# Patient Record
Sex: Female | Born: 1940
Health system: Southern US, Community
[De-identification: ages and names within clinical notes are randomized; demographics above are authoritative.]

## PROBLEM LIST (undated history)

## (undated) DIAGNOSIS — J45990 Exercise induced bronchospasm: Secondary | ICD-10-CM

## (undated) DIAGNOSIS — I442 Atrioventricular block, complete: Secondary | ICD-10-CM

## (undated) DIAGNOSIS — R112 Nausea with vomiting, unspecified: Secondary | ICD-10-CM

## (undated) DIAGNOSIS — Z860100 Personal history of colon polyps, unspecified: Secondary | ICD-10-CM

## (undated) DIAGNOSIS — Z950 Presence of cardiac pacemaker: Secondary | ICD-10-CM

## (undated) DIAGNOSIS — F329 Major depressive disorder, single episode, unspecified: Secondary | ICD-10-CM

## (undated) DIAGNOSIS — J45909 Unspecified asthma, uncomplicated: Secondary | ICD-10-CM

## (undated) DIAGNOSIS — I469 Cardiac arrest, cause unspecified: Secondary | ICD-10-CM

## (undated) DIAGNOSIS — F32A Depression, unspecified: Secondary | ICD-10-CM

## (undated) DIAGNOSIS — R87619 Unspecified abnormal cytological findings in specimens from cervix uteri: Secondary | ICD-10-CM

## (undated) DIAGNOSIS — J9601 Acute respiratory failure with hypoxia: Secondary | ICD-10-CM

## (undated) DIAGNOSIS — I4891 Unspecified atrial fibrillation: Secondary | ICD-10-CM

## (undated) DIAGNOSIS — I10 Essential (primary) hypertension: Secondary | ICD-10-CM

## (undated) DIAGNOSIS — M8580 Other specified disorders of bone density and structure, unspecified site: Secondary | ICD-10-CM

## (undated) DIAGNOSIS — Z9889 Other specified postprocedural states: Secondary | ICD-10-CM

## (undated) HISTORY — DX: Other specified disorders of bone density and structure, unspecified site: M85.80

## (undated) HISTORY — PX: FRACTURE SURGERY: SHX138

## (undated) HISTORY — PX: TUBAL LIGATION: SHX77

## (undated) HISTORY — PX: TONSILLECTOMY: SUR1361

## (undated) HISTORY — PX: CHOLECYSTECTOMY OPEN: SUR202

## (undated) HISTORY — DX: Personal history of colonic polyps: Z86.010

## (undated) HISTORY — DX: Unspecified atrial fibrillation: I48.91

## (undated) HISTORY — PX: APPENDECTOMY: SHX54

## (undated) HISTORY — DX: Unspecified abnormal cytological findings in specimens from cervix uteri: R87.619

## (undated) HISTORY — PX: WRIST FRACTURE SURGERY: SHX121

## (undated) HISTORY — DX: Acute respiratory failure with hypoxia: J96.01

## (undated) HISTORY — DX: Personal history of colon polyps, unspecified: Z86.0100

## (undated) HISTORY — DX: Unspecified asthma, uncomplicated: J45.909

## (undated) HISTORY — PX: CRYOTHERAPY: SHX1416

## (undated) HISTORY — DX: Cardiac arrest, cause unspecified: I46.9

---

## 1998-07-04 ENCOUNTER — Other Ambulatory Visit: Admission: RE | Admit: 1998-07-04 | Discharge: 1998-07-04 | Payer: Self-pay | Admitting: Obstetrics and Gynecology

## 1998-10-11 ENCOUNTER — Encounter (INDEPENDENT_AMBULATORY_CARE_PROVIDER_SITE_OTHER): Payer: Self-pay

## 1998-10-11 ENCOUNTER — Other Ambulatory Visit: Admission: RE | Admit: 1998-10-11 | Discharge: 1998-10-11 | Payer: Self-pay | Admitting: Gastroenterology

## 1998-12-27 ENCOUNTER — Encounter: Admission: RE | Admit: 1998-12-27 | Discharge: 1998-12-27 | Payer: Self-pay | Admitting: Obstetrics and Gynecology

## 1998-12-27 ENCOUNTER — Encounter: Payer: Self-pay | Admitting: Obstetrics and Gynecology

## 1999-07-03 ENCOUNTER — Other Ambulatory Visit: Admission: RE | Admit: 1999-07-03 | Discharge: 1999-07-03 | Payer: Self-pay | Admitting: Obstetrics and Gynecology

## 2000-07-08 ENCOUNTER — Other Ambulatory Visit: Admission: RE | Admit: 2000-07-08 | Discharge: 2000-07-08 | Payer: Self-pay | Admitting: Obstetrics and Gynecology

## 2001-08-22 ENCOUNTER — Other Ambulatory Visit: Admission: RE | Admit: 2001-08-22 | Discharge: 2001-08-22 | Payer: Self-pay | Admitting: Obstetrics and Gynecology

## 2002-02-03 ENCOUNTER — Encounter: Payer: Self-pay | Admitting: Obstetrics and Gynecology

## 2002-02-03 ENCOUNTER — Encounter: Admission: RE | Admit: 2002-02-03 | Discharge: 2002-02-03 | Payer: Self-pay | Admitting: Obstetrics and Gynecology

## 2002-08-24 ENCOUNTER — Other Ambulatory Visit: Admission: RE | Admit: 2002-08-24 | Discharge: 2002-08-24 | Payer: Self-pay | Admitting: Obstetrics and Gynecology

## 2003-08-13 ENCOUNTER — Other Ambulatory Visit: Admission: RE | Admit: 2003-08-13 | Discharge: 2003-08-13 | Payer: Self-pay | Admitting: Obstetrics and Gynecology

## 2003-12-29 ENCOUNTER — Ambulatory Visit: Payer: Self-pay | Admitting: Gastroenterology

## 2004-01-11 ENCOUNTER — Ambulatory Visit: Payer: Self-pay | Admitting: Gastroenterology

## 2004-02-03 ENCOUNTER — Ambulatory Visit: Payer: Self-pay | Admitting: Internal Medicine

## 2004-08-16 ENCOUNTER — Other Ambulatory Visit: Admission: RE | Admit: 2004-08-16 | Discharge: 2004-08-16 | Payer: Self-pay | Admitting: *Deleted

## 2005-08-17 ENCOUNTER — Other Ambulatory Visit: Admission: RE | Admit: 2005-08-17 | Discharge: 2005-08-17 | Payer: Self-pay | Admitting: Obstetrics & Gynecology

## 2006-08-21 ENCOUNTER — Other Ambulatory Visit: Admission: RE | Admit: 2006-08-21 | Discharge: 2006-08-21 | Payer: Self-pay | Admitting: Obstetrics & Gynecology

## 2007-08-25 ENCOUNTER — Other Ambulatory Visit: Admission: RE | Admit: 2007-08-25 | Discharge: 2007-08-25 | Payer: Self-pay | Admitting: Obstetrics & Gynecology

## 2008-12-03 ENCOUNTER — Encounter (INDEPENDENT_AMBULATORY_CARE_PROVIDER_SITE_OTHER): Payer: Self-pay | Admitting: *Deleted

## 2008-12-13 ENCOUNTER — Ambulatory Visit (HOSPITAL_COMMUNITY): Admission: RE | Admit: 2008-12-13 | Discharge: 2008-12-13 | Payer: Self-pay | Admitting: Internal Medicine

## 2009-05-20 DIAGNOSIS — J302 Other seasonal allergic rhinitis: Secondary | ICD-10-CM | POA: Insufficient documentation

## 2009-05-20 DIAGNOSIS — M1990 Unspecified osteoarthritis, unspecified site: Secondary | ICD-10-CM | POA: Insufficient documentation

## 2009-09-07 ENCOUNTER — Telehealth (INDEPENDENT_AMBULATORY_CARE_PROVIDER_SITE_OTHER): Payer: Self-pay | Admitting: *Deleted

## 2010-02-11 ENCOUNTER — Emergency Department (HOSPITAL_BASED_OUTPATIENT_CLINIC_OR_DEPARTMENT_OTHER)
Admission: EM | Admit: 2010-02-11 | Discharge: 2010-02-11 | Payer: Self-pay | Source: Home / Self Care | Admitting: Emergency Medicine

## 2010-02-24 ENCOUNTER — Encounter
Admission: RE | Admit: 2010-02-24 | Discharge: 2010-02-24 | Payer: Self-pay | Source: Home / Self Care | Attending: Orthopedic Surgery | Admitting: Orthopedic Surgery

## 2010-03-28 NOTE — Progress Notes (Signed)
Summary: Colonoscopy done --- Changed Care.   Phone Note Outgoing Call Call back at [facility] Phone [phone]   Call placed by: Harlow Mares CMA Duncan Dull),  September 07, 2009 10:55 AM Call placed to: Patient Summary of Call: pt states that since Dr. Corinda Gubler retired she decide to change care. She has had her colonoscopy done already  Initial call taken by: Harlow Mares CMA Duncan Dull),  September 07, 2009 10:57 AM

## 2010-03-28 NOTE — Letter (Signed)
Summary: Colonoscopy Letter  [facility]  [address] [place], [place] [address]   Phone: [phone]  Fax: [phone]      December 03, 2008 MRN: [id number]   Danielle Lucas [address] [place], [place]  [address]   Dear [name],   According to your medical record, it is time for you to schedule a Colonoscopy. The American Cancer Society recommends this procedure as a method to detect early colon cancer. Patients with a family history of colon cancer, or a personal history of colon polyps or inflammatory bowel disease are at increased risk.  This letter has beeen generated based on the recommendations made at the time of your procedure. If you feel that in your particular situation this may no longer apply, please contact our office.  Please call our office at [phone] to schedule this appointment or to update your records at your earliest convenience.  Thank you for cooperating with [place] to provide you with the very best care possible.   Sincerely,  Rachael Fee, M.D.  [facility] [phone]

## 2010-10-23 ENCOUNTER — Other Ambulatory Visit: Payer: Self-pay | Admitting: Internal Medicine

## 2010-10-23 DIAGNOSIS — R102 Pelvic and perineal pain: Secondary | ICD-10-CM

## 2010-10-25 ENCOUNTER — Other Ambulatory Visit: Payer: Self-pay

## 2010-10-31 ENCOUNTER — Ambulatory Visit
Admission: RE | Admit: 2010-10-31 | Discharge: 2010-10-31 | Disposition: A | Discharge status: Home / Self Care | Payer: Medicare Other | Source: Ambulatory Visit | Attending: Internal Medicine | Admitting: Internal Medicine

## 2010-10-31 DIAGNOSIS — R102 Pelvic and perineal pain: Secondary | ICD-10-CM

## 2012-01-01 ENCOUNTER — Other Ambulatory Visit: Payer: Self-pay | Admitting: Internal Medicine

## 2012-01-01 DIAGNOSIS — F329 Major depressive disorder, single episode, unspecified: Secondary | ICD-10-CM | POA: Insufficient documentation

## 2012-01-01 DIAGNOSIS — R1030 Lower abdominal pain, unspecified: Secondary | ICD-10-CM

## 2012-01-03 ENCOUNTER — Ambulatory Visit
Admission: RE | Admit: 2012-01-03 | Discharge: 2012-01-03 | Disposition: A | Discharge status: Home / Self Care | Payer: Medicare Other | Source: Ambulatory Visit | Attending: Internal Medicine | Admitting: Internal Medicine

## 2012-01-03 DIAGNOSIS — R1030 Lower abdominal pain, unspecified: Secondary | ICD-10-CM

## 2012-10-14 ENCOUNTER — Encounter (HOSPITAL_COMMUNITY): Payer: Self-pay

## 2012-10-14 ENCOUNTER — Ambulatory Visit (HOSPITAL_COMMUNITY)
Admission: RE | Admit: 2012-10-14 | Discharge: 2012-10-14 | Disposition: A | Discharge status: Home / Self Care | Payer: Medicare Other | Source: Ambulatory Visit | Attending: Internal Medicine | Admitting: Internal Medicine

## 2012-10-14 DIAGNOSIS — M810 Age-related osteoporosis without current pathological fracture: Secondary | ICD-10-CM | POA: Insufficient documentation

## 2012-10-14 HISTORY — DX: Other specified postprocedural states: Z98.890

## 2012-10-14 HISTORY — DX: Depression, unspecified: F32.A

## 2012-10-14 HISTORY — DX: Major depressive disorder, single episode, unspecified: F32.9

## 2012-10-14 HISTORY — DX: Nausea with vomiting, unspecified: R11.2

## 2012-10-14 MED ORDER — ZOLEDRONIC ACID 5 MG/100ML IV SOLN
5.0000 mg | Freq: Once | INTRAVENOUS | Status: AC
  Administered 2012-10-14: 5 mg via INTRAVENOUS
  Filled 2012-10-14: qty 100

## 2012-10-14 MED ORDER — SODIUM CHLORIDE 0.9 % IV SOLN
INTRAVENOUS | Status: DC
  Administered 2012-10-14: 20 mL/h via INTRAVENOUS

## 2012-11-03 ENCOUNTER — Encounter (HOSPITAL_COMMUNITY): Payer: Medicare Other

## 2013-01-14 ENCOUNTER — Encounter: Payer: Self-pay | Admitting: Obstetrics & Gynecology

## 2013-01-15 ENCOUNTER — Encounter: Payer: Self-pay | Admitting: Obstetrics & Gynecology

## 2013-01-15 ENCOUNTER — Ambulatory Visit (INDEPENDENT_AMBULATORY_CARE_PROVIDER_SITE_OTHER): Payer: Medicare Other | Admitting: Obstetrics & Gynecology

## 2013-01-15 VITALS — BP 122/80 | HR 72 | Resp 18 | Ht 64.25 in | Wt 142.0 lb

## 2013-01-15 DIAGNOSIS — N8111 Cystocele, midline: Secondary | ICD-10-CM

## 2013-01-15 DIAGNOSIS — Z01419 Encounter for gynecological examination (general) (routine) without abnormal findings: Secondary | ICD-10-CM

## 2013-01-15 DIAGNOSIS — IMO0002 Reserved for concepts with insufficient information to code with codable children: Secondary | ICD-10-CM

## 2013-01-15 DIAGNOSIS — N814 Uterovaginal prolapse, unspecified: Secondary | ICD-10-CM

## 2013-01-15 DIAGNOSIS — Z124 Encounter for screening for malignant neoplasm of cervix: Secondary | ICD-10-CM

## 2013-01-15 NOTE — Progress Notes (Signed)
72 y.o. [id number] DivorcedCaucasianF here for annual exam.  No vaginal bleeding.  Had BMD with Dr. Laurey Morale office.  Did Reclast in the summer.  Repeat BMD in two years.  Patient is getting ready to think about having bladder repair.  Planning on going to [place] next year.  Went to [place] in September.    Cystocele is really bothering pt now.  She is ready to proceed with surgery.  D/W pt need for hysterectomy at same time due to pt's uterine prolapse.  D/W pt surgery, recommendation for ovary removal as well, hospital stay, recovery.  She feels she needs to "fit this in" this year if possible.  Works part time with [employer] and not sure she can do anything until April.  D/W pt pessary use until then.  She wants to know why all of a sudden she is feeling so much worse, so quickly.    Patient's last menstrual period was 02/27/2000.          Sexually active: no  The current method of family planning is post menopausal status.    Exercising: yes  cardio,weights, yoga 4x/wk Smoker:  no  Health Maintenance: Pap:  11/03/2010 Neg History of abnormal Pap:  Yes, cryosurgery  MMG:  10/16/12 Bi-Rads 2  Colonoscopy:  2011 f/u in 5 years ago, Dr. Elnoria Howard BMD:   7/14 TDaP:  2012 (at [facility]) Pneumovax: 2006, Zostavax: 2006 Screening Labs: PCP, Hb today: PCP, Urine today: PCP   reports that she has quit smoking. She does not have any smokeless tobacco history on file. She reports that she does not use illicit drugs.  Past Medical History  Diagnosis Date  . Depression   . PONV (postoperative nausea and vomiting)     pt. thinks it was from dilaudid  . Hx of colonic polyps   . Asthma     as a child  . Osteopenia     Past Surgical History  Procedure Laterality Date  . Appendectomy    . Tubal ligation    . Cholecystectomy    . Left wrist Left     left wrist, plate inserted    Current Outpatient Prescriptions  Medication Sig Dispense Refill  . albuterol (PROVENTIL HFA;VENTOLIN HFA) 108 (90 BASE) MCG/ACT  inhaler Inhale 2 puffs into the lungs every 6 (six) hours as needed for wheezing.      Marland Kitchen aspirin EC 81 MG tablet Take 81 mg by mouth daily.      . Calcium-Vitamin D-Vitamin K (VIACTIV) 500-500-40 MG-UNT-MCG CHEW Chew by mouth 2 (two) times daily.      . Cholecalciferol (VITAMIN D) 2000 UNITS CAPS Take by mouth.      . escitalopram (LEXAPRO) 10 MG tablet Take 5 mg by mouth daily.       . TURMERIC PO Take by mouth.      . Zoledronic Acid (RECLAST IV) Inject into the vein.      . Cyanocobalamin (VITAMIN B-12 PO) Take by mouth daily.      . Multiple Vitamins-Minerals (MULTIVITAMIN WITH MINERALS) tablet Take 1 tablet by mouth daily.       No current facility-administered medications for this visit.    Family History  Problem Relation Age of Onset  . Varicose Veins Mother   . Heart attack Father   . Cancer Brother     colon cancer (polyps)    ROS:  Pertinent items are noted in HPI.  Otherwise, a comprehensive ROS was negative.  Exam:   BP  122/80  Pulse 72  Resp 18  Ht 5' 4.25" (1.632 m)  Wt 142 lb (64.411 kg)  BMI 24.18 kg/m2  LMP 02/27/2000  Weight change: +2lb   Height: 5' 4.25" (163.2 cm)  Ht Readings from Last 3 Encounters:  01/15/13 5' 4.25" (1.632 m)  10/14/12 5\' 3"  (1.6 m)    General appearance: alert, cooperative and appears stated age Head: Normocephalic, without obvious abnormality, atraumatic Neck: no adenopathy, supple, symmetrical, trachea midline and thyroid normal to inspection and palpation Lungs: clear to auscultation bilaterally Breasts: normal appearance, no masses or tenderness Heart: regular rate and rhythm Abdomen: soft, non-tender; bowel sounds normal; no masses,  no organomegaly Extremities: extremities normal, atraumatic, no cyanosis or edema Skin: Skin color, texture, turgor normal. No rashes or lesions Lymph nodes: Cervical, supraclavicular, and axillary nodes normal. No abnormal inguinal nodes palpated Neurologic: Grossly normal   Pelvic:  External genitalia:  no lesions              Urethra:  normal appearing urethra with no masses, tenderness or lesions              Bartholins and Skenes: normal                 Vagina: normal appearing vagina with normal color and discharge, no lesions, 4th degree cystocele              Cervix: no lesions              Pap taken: yes Bimanual Exam:  Uterus:  Small and mobile with 3rd degree uterine prolapse, significant change from prior exams              Adnexa: normal adnexa and no mass, fullness, tenderness               Rectovaginal: Confirms               Anus:  normal sphincter tone, no lesions  A:  Well Woman with normal exam PMP, no HRT Large cystocele with uterine prolapse--worse since last exam Osteopenia--followed by Dr. Waynard Edwards Dense breasts H/O colonic polyps  P:   Mammogram yearly.  D/W 3D MMG pap smear today Lengthy discussion with pt regarding change in prolapse and surgery as documented in note.  Pt unsure about timing.  Advised she needs to check on this.  She will most likely need to return to see Dr. Edward Jolly and schedule urodynamics as well.  return annually or prn  ~In addition to annual exam, 20 minutes spent with patient discussing uterine prolapse and cystocele issues as documented above.    An After Visit Summary was printed and given to the patient.

## 2013-01-16 DIAGNOSIS — N814 Uterovaginal prolapse, unspecified: Secondary | ICD-10-CM | POA: Insufficient documentation

## 2013-01-16 DIAGNOSIS — IMO0002 Reserved for concepts with insufficient information to code with codable children: Secondary | ICD-10-CM | POA: Insufficient documentation

## 2013-01-16 NOTE — Patient Instructions (Signed)

## 2013-01-19 ENCOUNTER — Telehealth: Payer: Self-pay | Admitting: *Deleted

## 2013-01-19 LAB — IPS PAP SMEAR ONLY

## 2013-01-19 NOTE — Telephone Encounter (Signed)
Follow-up call to patient regarding surgery.  She is still leaning toward mid April 2015.  Discussed consult with Dr Edward Jolly as well and scheduled for 03-19-13.  Following that appointment, patient will let me know about surgery date preferences.  Has pessary appointment with Dr Hyacinth Meeker 02-02-13.  Routing to provider for final review. Patient agreeable to disposition. Will close encounter

## 2013-01-26 DIAGNOSIS — E785 Hyperlipidemia, unspecified: Secondary | ICD-10-CM | POA: Insufficient documentation

## 2013-02-02 ENCOUNTER — Encounter: Payer: Self-pay | Admitting: Obstetrics & Gynecology

## 2013-02-02 ENCOUNTER — Ambulatory Visit (INDEPENDENT_AMBULATORY_CARE_PROVIDER_SITE_OTHER): Payer: Medicare Other | Admitting: Obstetrics & Gynecology

## 2013-02-02 VITALS — BP 112/68 | HR 72 | Resp 16 | Wt 141.0 lb

## 2013-02-02 DIAGNOSIS — N8111 Cystocele, midline: Secondary | ICD-10-CM

## 2013-02-02 DIAGNOSIS — N814 Uterovaginal prolapse, unspecified: Secondary | ICD-10-CM

## 2013-02-02 DIAGNOSIS — IMO0002 Reserved for concepts with insufficient information to code with codable children: Secondary | ICD-10-CM

## 2013-02-02 NOTE — Progress Notes (Signed)
72 y.o. DivorcedWhite female [id number] here for pessary fitting.  Patient has been diagnosed with the following indications warranting pessary use: Cystocele- symptomatic and incomplete uterine prolapse.    She reports these associated symptoms: Vaginal discharge: no Vaginal pressure:  yes Urinary symptoms:   urinary incontinence.   Other:  none  She has been counseled about other options including pelvic physical therapy, surgical intervention, as well as doing nothing.  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      Patient is not sexually active. Urine culture has not been performed.   Exam: BP 112/68  Pulse 72  Resp 16  Wt 141 lb (63.957 kg)  LMP 02/27/2000 General appearance: alert and cooperative Inguinal adenopathy: neg   Pelvic: External genitalia:  no lesions              Urethra: normal appearing urethra with no masses, tenderness or lesions              Bartholins and Skenes: Bartholin's, Urethra, Skene's normal                 Vagina: normal appearing vagina with normal color and discharge, no lesions, 4th degree prolapse with 2nd degree uterine prolapse              Cervix: normal appearance Bimanual Exam:  Uterus:  uterus is normal size, shape, consistency and nontender                               Adnexa:    normal adnexa in size, nontender and no masses                               Anus:  defer exam  Procedure:  Patient fitted with the following pessary and sizes:  Incontinence ring with support, #4, #5, and #6.  After each fitting, patient was advised to redress, ambulate, and attempt to void.  The best fit was with #5.  Pessary was removed without difficulty.   Assessment:   Cystocele- symptomatic Incomplete uterine prolapse  Plan: Pessary will be ordered for patient and she will return to office in 1 weeks for pessary pick-up and/or placement.

## 2013-02-16 ENCOUNTER — Ambulatory Visit (INDEPENDENT_AMBULATORY_CARE_PROVIDER_SITE_OTHER): Payer: Medicare Other | Admitting: Obstetrics & Gynecology

## 2013-02-16 VITALS — BP 120/76 | HR 60 | Resp 16 | Ht 64.25 in | Wt 140.4 lb

## 2013-02-16 DIAGNOSIS — IMO0002 Reserved for concepts with insufficient information to code with codable children: Secondary | ICD-10-CM

## 2013-02-16 DIAGNOSIS — N814 Uterovaginal prolapse, unspecified: Secondary | ICD-10-CM

## 2013-02-16 DIAGNOSIS — N8111 Cystocele, midline: Secondary | ICD-10-CM

## 2013-02-16 NOTE — Progress Notes (Signed)
72 y.o. Divorced White female  [id number] here for pessary placement.  Patient has been diagnosed with the following indications warranting pessary use: Cystocele- symptomatic.  Patient is trying to get through the next few months and tax season until she can really consider surgery.  Pt will be using a #5 incontinence ring with support and without knob.  Exam: BP 120/76  Pulse 60  Resp 16  Ht 5' 4.25" (1.632 m)  Wt 140 lb 6.4 oz (63.685 kg)  BMI 23.91 kg/m2  LMP 02/27/2000 General appearance: alert and cooperative   Pelvic: External genitalia:  no lesions              Urethra: normal appearing urethra with no masses, tenderness or lesions              Bartholins and Skenes: Bartholin's, Urethra, Skene's normal                 Vagina: normal appearing vagina with normal color and discharge, no lesions, atrophic, large 4th degree cystocele present              Cervix: normal appearance Bimanual Exam:  Uterus:  uterus is normal size, shape, consistency and non tender, 3rd degree uterine prolapse present                               Adnexa:    normal adnexa in size, nontender and no masses                               Anus:  defer exam  Procedure:  Patient fitted with #5 incontinence ring without difficulty.  Pt able to walk and void without problems.   Assessment:  Cystocele and incomplete uterine prolapse  Plan: Pt to return in 3 months for recheck if no issues before that time.

## 2013-02-24 ENCOUNTER — Encounter: Payer: Self-pay | Admitting: Obstetrics & Gynecology

## 2013-02-24 ENCOUNTER — Telehealth: Payer: Self-pay

## 2013-02-24 ENCOUNTER — Ambulatory Visit: Payer: Medicare Other | Admitting: Obstetrics & Gynecology

## 2013-02-24 NOTE — Telephone Encounter (Signed)
Spoke with patient-states she is doing ok. Is having some leaking, having to wear a pad, but aware this is temporary. Also is having an achy feeling with urination. Please advise.

## 2013-02-24 NOTE — Telephone Encounter (Signed)
OK to monitor.  Call if has worsening symptoms.

## 2013-02-24 NOTE — Telephone Encounter (Signed)
lmtcb

## 2013-02-24 NOTE — Telephone Encounter (Signed)
Message copied by Elisha Headland on Tue Feb 24, 2013 10:52 AM ------      Message from: Jerene Bears      Created: Tue Feb 24, 2013  6:59 AM      Regarding: call pt please       Tresa Endo      Can you check on mrs. Coger.  I placed a new pessary last week and want to know how she is doing.  Thanks. ------

## 2013-02-24 NOTE — Telephone Encounter (Signed)
Patient aware and will call prn//kn

## 2013-02-26 HISTORY — PX: TOTAL ABDOMINAL HYSTERECTOMY W/ BILATERAL SALPINGOOPHORECTOMY: SHX83

## 2013-03-09 ENCOUNTER — Ambulatory Visit: Payer: Medicare Other | Admitting: Obstetrics and Gynecology

## 2013-03-19 ENCOUNTER — Encounter: Payer: Self-pay | Admitting: Obstetrics and Gynecology

## 2013-03-19 ENCOUNTER — Ambulatory Visit (INDEPENDENT_AMBULATORY_CARE_PROVIDER_SITE_OTHER): Payer: Medicare HMO | Admitting: Obstetrics and Gynecology

## 2013-03-19 VITALS — BP 129/71 | HR 69 | Resp 14 | Wt 140.0 lb

## 2013-03-19 DIAGNOSIS — N813 Complete uterovaginal prolapse: Secondary | ICD-10-CM

## 2013-03-19 DIAGNOSIS — N393 Stress incontinence (female) (male): Secondary | ICD-10-CM

## 2013-03-19 NOTE — Progress Notes (Signed)
Patient ID: Danielle Lucas, female   DOB: 06-18-1940, 73 y.o.   MRN: [id number]  GYNECOLOGY PROBLEM VISIT  PCP:   Referring provider:  M. Edwinna Areola, MD  HPI: 73 y.o.   Divorced  Caucasian  female   [phone] with Patient's last menstrual period was 02/27/2000.   here for discussion of prolapse of uterus and bladder for several years.  Sees Dr. Sabra Heck for routine gynecologic exam, who has noted a worsening of prolapse.  Patient notes a change over the last 6 months.  Using a pessary currently.  Now having urinary leakage with the pessary in place.  Leaks with cough and laugh. Does not really leak for no reason at all.  Hs some urgency to void.  DF - depends on water intake, but not too often.  NF - once a night. Has had some experiences of wetting at night but is every aware and immediately wakes up.  Just a few drops only.  Did not void well before receiving the pessary.  Voids better now.   No constipation but does push around the rectum once in a while to have bowel movements.   No prior bladder or prolapse evaluation or treatment. No physical therapy to date.   Bottom sore from diarrhea following antibiotics for a sinus infections.  Healthy and does not have any respiratory problems unless around cats - has allergies.  Does not even have an inhaler at this time.  GYNECOLOGIC HISTORY: Patient's last menstrual period was 02/27/2000. Sexually active:  no   OB History   Grav Para Term Preterm Abortions TAB SAB Ect Mult Living   10 4 2 2 6 2 2 2 2 2          Family History  Problem Relation Age of Onset  . Varicose Veins Mother   . Heart attack Father   . Cancer Brother     colon cancer (polyps)    Patient Active Problem List   Diagnosis Date Noted  . Cystocele 01/16/2013  . Uterine prolapse 01/16/2013    Past Medical History  Diagnosis Date  . Depression   . PONV (postoperative nausea and vomiting)     pt. thinks it was from dilaudid  . Hx of colonic  polyps   . Asthma     as a child  . Osteopenia     Past Surgical History  Procedure Laterality Date  . Appendectomy    . Tubal ligation    . Cholecystectomy    . Left wrist Left     left wrist, plate inserted    ALLERGIES: Codeine and Dilaudid  Current Outpatient Prescriptions  Medication Sig Dispense Refill  . albuterol (PROVENTIL HFA;VENTOLIN HFA) 108 (90 BASE) MCG/ACT inhaler Inhale 2 puffs into the lungs every 6 (six) hours as needed for wheezing.      Marland Kitchen aspirin EC 81 MG tablet Take 81 mg by mouth daily.      . Azelastine HCl 0.15 % SOLN       . Calcium-Vitamin D-Vitamin K (VIACTIV) [id number] MG-UNT-MCG CHEW Chew by mouth 2 (two) times daily.      . Cholecalciferol (VITAMIN D) 2000 UNITS CAPS Take by mouth.      . escitalopram (LEXAPRO) 10 MG tablet Take 5 mg by mouth daily.       . NON FORMULARY Allergy injections once weekly      . TURMERIC PO Take by mouth.      . Zoledronic Acid (RECLAST IV) Inject into the vein.      Marland Kitchen  Cyanocobalamin (VITAMIN B-12 PO) Take by mouth daily.      . Multiple Vitamins-Minerals (MULTIVITAMIN WITH MINERALS) tablet Take 1 tablet by mouth daily.       No current facility-administered medications for this visit.     ROS:  Pertinent items are noted in HPI.  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 Goes to the [employer] 4 days a week. Not a care giver for anyone.  No really heavy work at home other than flower pots.    PHYSICAL EXAMINATION:    BP 129/71  Pulse 69  Resp 14  Wt 140 lb (63.504 kg)  LMP 02/27/2000   Wt Readings from Last 3 Encounters:  03/19/13 140 lb (63.504 kg)  02/16/13 140 lb 6.4 oz (63.685 kg)  02/02/13 141 lb (63.957 kg)     Ht Readings from Last 3 Encounters:  02/16/13 5' 4.25" (1.632 m)  01/15/13 5' 4.25" (1.632 m)  10/14/12 5\' 3"  (1.6 m)    General appearance: alert, cooperative and appears stated age, thin.  Head: Normocephalic, without obvious abnormality, atraumatic Lungs:  clear to auscultation bilaterally Heart: regular rate and rhythm Abdomen: soft, non-tender; no masses,  no organomegaly. Nodes - no inguinal nodes palpated Neurologic: Grossly normal  Pelvic: External genitalia:  no lesions              Urethra:  normal appearing urethra with no masses, tenderness or lesions              Bartholins and Skenes: normal                 Vagina: normal appearing vagina with normal color and discharge, slight erythema of the left vaginal apex, third degree cystocele              Cervix: normal appearance, second degree uterine prolapse.                       Bimanual Exam:  Uterus:  uterus is normal size, shape, consistency and nontender                                      Adnexa: normal adnexa in size, nontender and no masses                                      Rectovaginal: deferred due to diarrhea.  Procedure - Pessary - ring with support - removed, cleansed and replaced with Premarin 1 gram on pessary. (discussed risks and benefits of Premarin.  Gave small sample tube to patient to keep.)  ASSESSMENT  Complete uterovaginal prolapse. Genuine stress incontinence by history. Pessary use.   PLAN   I have had a comprehensive discussion with the patient regarding prolapse and urinary incontinence.  I have provided reading materials from ACOG regarding prolapse and incontinence in general as well as medical and surgical treatment for these conditions.   We discussed benefits and risks which include but are not limited to bleeding, infection, damage to surrounding organs, ureteral damage, vaginal pain with intercourse, mesh erosion and exposure, dyspareunia, urinary retention and need for prolonged catheterization and/or self catheterization, reoperation, urge incontinence, recurrence of prolapse, peripheral neuropathy, hernia formation, DVT, PE, death, and reaction to anesthesia.    The patient and I discussed  an abdominal versus a vaginal approach for a  prolapse repair including a vaginal vault suspension.  Patient understands that I currently do not perform laparoscopic or robotic prolapse repairs. We discussed routes for hysterectomy to accompany a prolapse repair.  Patient would like to proceed with a total abdominal hysterectomy with bilateral salpingo-oophorectomy, abdominal sacrocolpopexy, anterior and possible posterior colporrhaphy and TVT midurethral sling with cystoscopy.  Surgery will be coordinated with Dr. Edwinna Areola.  Patient will proceed with multichannel urodynamics in office and return for that visit.   We discussed recovery issues and expectations.     An After Visit Summary was printed and given to the patient.  One hour face to face time of which over 50% was spent in counseling.

## 2013-03-19 NOTE — Patient Instructions (Addendum)
Please refer to your handouts on surgery for prolapse and incontinence.   We will call you to schedule urodynamic testing.

## 2013-04-10 ENCOUNTER — Other Ambulatory Visit: Payer: Medicare HMO

## 2013-04-10 ENCOUNTER — Ambulatory Visit (INDEPENDENT_AMBULATORY_CARE_PROVIDER_SITE_OTHER): Payer: Medicare HMO | Admitting: *Deleted

## 2013-04-10 VITALS — BP 138/80 | HR 63 | Resp 18 | Wt 138.0 lb

## 2013-04-10 DIAGNOSIS — R82998 Other abnormal findings in urine: Secondary | ICD-10-CM

## 2013-04-10 DIAGNOSIS — N393 Stress incontinence (female) (male): Secondary | ICD-10-CM

## 2013-04-10 LAB — POCT URINALYSIS DIPSTICK
Bilirubin, UA: NEGATIVE
Glucose, UA: NEGATIVE
Ketones, UA: NEGATIVE
Nitrite, UA: NEGATIVE
Protein, UA: NEGATIVE
Urobilinogen, UA: NEGATIVE

## 2013-04-10 NOTE — Progress Notes (Signed)
Patient in today for urine check. Pt states she has no sx's of a UTI.  -Urine dipstick showed large leukocytes and blood. Send for culture and micro. - Pt has Urodynamic appt on 04/15/2013.

## 2013-04-11 LAB — URINALYSIS, MICROSCOPIC ONLY
Bacteria, UA: NONE SEEN
Casts: NONE SEEN
Crystals: NONE SEEN

## 2013-04-12 LAB — URINE CULTURE: Colony Count: >=100000

## 2013-04-13 ENCOUNTER — Telehealth: Payer: Self-pay | Admitting: *Deleted

## 2013-04-13 ENCOUNTER — Other Ambulatory Visit: Payer: Self-pay | Admitting: Obstetrics and Gynecology

## 2013-04-13 MED ORDER — CIPROFLOXACIN HCL 500 MG PO TABS: 500.0000 mg | ORAL_TABLET | Freq: Two times a day (BID) | ORAL | Status: DC

## 2013-04-13 NOTE — Telephone Encounter (Signed)
Message copied by Jaymes Graff on Mon Apr 13, 2013  2:55 PM ------      Message from: [place], [place]: Mon Apr 13, 2013  1:56 PM       Gay Filler,            Please contact patient about her UTI and cancel the urodynamics.        I will send an Rx to her pharmacy for ciprofloxacin 500 mg po bid for 7 days.      She will need a repeat urine culture test of cure prior to proceeding with urodynamic testing. ------

## 2013-04-13 NOTE — Telephone Encounter (Signed)
Patient notified of results as directed by Dr Quincy Simmonds and that antibiotic RX has been sent to [facility]. Advised to be sure to complete medication and to call if develops pain or fever.  Will reschedule urodynamoics and do TOC just prior to this appointment.  Will reschedule this and call her back. Patient agreeable.  Routing to provider for final review. Patient agreeable to disposition. Will close encounter

## 2013-04-15 ENCOUNTER — Ambulatory Visit: Payer: Medicare HMO

## 2013-04-24 ENCOUNTER — Telehealth: Payer: Self-pay | Admitting: Obstetrics & Gynecology

## 2013-04-24 NOTE — Telephone Encounter (Signed)
Note not needed 

## 2013-05-07 ENCOUNTER — Ambulatory Visit (INDEPENDENT_AMBULATORY_CARE_PROVIDER_SITE_OTHER): Payer: Medicare HMO

## 2013-05-07 ENCOUNTER — Other Ambulatory Visit: Payer: Medicare HMO

## 2013-05-07 VITALS — BP 124/76 | HR 78 | Resp 16 | Ht 64.25 in | Wt 138.0 lb

## 2013-05-07 DIAGNOSIS — N390 Urinary tract infection, site not specified: Secondary | ICD-10-CM

## 2013-05-07 NOTE — Progress Notes (Signed)
Pt came in for urine culture. Pt feels better & hope urine comes back negative

## 2013-05-08 LAB — URINE CULTURE
Colony Count: NO GROWTH
Organism ID, Bacteria: NO GROWTH

## 2013-05-08 NOTE — Progress Notes (Signed)
Encounter reviewed by Dr. Josefa Half. Urine culture sent.

## 2013-05-11 ENCOUNTER — Telehealth: Payer: Self-pay | Admitting: Obstetrics and Gynecology

## 2013-05-11 NOTE — Telephone Encounter (Signed)
Thank you for the update.  I will close the encounter. 

## 2013-05-11 NOTE — Telephone Encounter (Signed)
Pt is wondering how long after urodynamics test should she schedule her surgery.

## 2013-05-11 NOTE — Telephone Encounter (Signed)
Spoke with patient. She is planning for a trip in May. Would like to schedule surgery for after trip in May. Advised per Gay Filler patient should keep Urodynamics appointment as planned, have post urodynamics appointment with Dr. Quincy Simmonds and then can schedule surgery for time/date that works best for her. Patient agreeable. Keeping appointment for urodynamics as scheduled for 3/18.   Routing to provider for final review. Patient agreeable to disposition. Will close encounter

## 2013-05-13 ENCOUNTER — Other Ambulatory Visit: Payer: Self-pay | Admitting: Obstetrics and Gynecology

## 2013-05-13 ENCOUNTER — Ambulatory Visit (INDEPENDENT_AMBULATORY_CARE_PROVIDER_SITE_OTHER): Payer: Medicare HMO | Admitting: Obstetrics and Gynecology

## 2013-05-13 VITALS — BP 127/84 | HR 72 | Resp 18 | Ht 64.25 in | Wt 138.0 lb

## 2013-05-13 DIAGNOSIS — N393 Stress incontinence (female) (male): Secondary | ICD-10-CM

## 2013-05-13 MED ORDER — ESTROGENS, CONJUGATED 0.625 MG/GM VA CREA: 1.0000 | TOPICAL_CREAM | Freq: Every day | VAGINAL | Status: DC

## 2013-05-15 NOTE — Progress Notes (Signed)
Urodynamic testing performed.  See separate report. During removal of patient's pessary, small amount of vaginal bleeding noted. Dr Quincy Simmonds evaluated patient, recommended discontinuation of pessary for one week and use of vaginal estrogen cream.

## 2013-05-21 NOTE — Progress Notes (Signed)
Patient examined and encounter and urodynamic report reviewed by Dr. Josefa Half.

## 2013-05-25 ENCOUNTER — Ambulatory Visit (INDEPENDENT_AMBULATORY_CARE_PROVIDER_SITE_OTHER): Payer: Medicare HMO | Admitting: Obstetrics and Gynecology

## 2013-05-25 ENCOUNTER — Encounter: Payer: Self-pay | Admitting: Obstetrics and Gynecology

## 2013-05-25 VITALS — BP 110/70 | HR 76 | Ht 64.25 in | Wt 136.8 lb

## 2013-05-25 DIAGNOSIS — N393 Stress incontinence (female) (male): Secondary | ICD-10-CM

## 2013-05-25 DIAGNOSIS — N813 Complete uterovaginal prolapse: Secondary | ICD-10-CM

## 2013-05-25 NOTE — Progress Notes (Signed)
After appointment with Dr Quincy Simmonds, surgery date of 07-28-13 at [facility] 0730 discussed.  This date was originally requested by patient due to her travel plans.  Surgery instructions given verbally and with written instruction sheet.  Pre/post op appointments scheduled based on travel plans and instructed to contact PAT dept at [facility] [phone] to schedule hosp PAT appointment before she leaves town.  Patient agreeable to surgery instructions. Plans to return to work (only works one day per week at [employer]) after 3 weeks and Dr Quincy Simmonds is agreeable to this as patient can tolerate.

## 2013-05-25 NOTE — Progress Notes (Signed)
Patient ID: Danielle Lucas, female   DOB: 1941/02/26, 73 y.o.   MRN: [id number]  GYNECOLOGY  VISIT   HPI: 73 y.o.   Divorced  Caucasian  female   [phone] with Patient's last menstrual period was 02/27/2000.   here for   Urodynamic follow up visit.  Leaks with the pessary ring in. Leaks if coughs or sneezes really hard. Cannot stop urinary flow if starts to leak.   Urodynamics on 05/13/13 - prolapse reduced with ring pessary with support.  Uroflow - Intermitten flow, 65 cc, PVR15 cc. CMG - S1 312 cc, S2 507 cc, Max capacity 615 cc.  No leak with valsalva.  Stable CMG. UCP - 33 cm H20 and 30 cm H20. Pressure flow - PDet ma 84 cm H20.Voided 885 cc.   Going to [place] Jul 06, 2013 - Jul 20, 2013.   GYNECOLOGIC HISTORY: Patient's last menstrual period was 02/27/2000. Not sexually active.          OB History   Grav Para Term Preterm Abortions TAB SAB Ect Mult Living   10 4 2 2 6 2 2 2 2 2          Patient Active Problem List   Diagnosis Date Noted  . Cystocele 01/16/2013  . Uterine prolapse 01/16/2013    Past Medical History  Diagnosis Date  . Depression   . PONV (postoperative nausea and vomiting)     pt. thinks it was from dilaudid  . Hx of colonic polyps   . Asthma     as a child  . Osteopenia     Past Surgical History  Procedure Laterality Date  . Appendectomy    . Tubal ligation    . Cholecystectomy    . Left wrist Left     left wrist, plate inserted    Current Outpatient Prescriptions  Medication Sig Dispense Refill  . albuterol (PROVENTIL HFA;VENTOLIN HFA) 108 (90 BASE) MCG/ACT inhaler Inhale 2 puffs into the lungs every 6 (six) hours as needed for wheezing.      Marland Kitchen aspirin EC 81 MG tablet Take 81 mg by mouth daily.      . Biotin 1 MG CAPS Take 1 mg by mouth every other day.      . Calcium-Vitamin D-Vitamin K (VIACTIV) 272-536-64 MG-UNT-MCG CHEW Chew by mouth 2 (two) times daily.      . Cholecalciferol (VITAMIN D) 2000 UNITS CAPS Take by mouth.      .  conjugated estrogens (PREMARIN) vaginal cream Place 1 Applicatorful vaginally daily. Use 1/2 g vaginally every night at bed time for the first 2 weeks, then use 1/2 g vaginally two or three times per week as needed to maintain symptom relief.  60 g  0  . Cyanocobalamin (VITAMIN B-12 PO) Take by mouth daily.      Marland Kitchen escitalopram (LEXAPRO) 10 MG tablet Take 5 mg by mouth daily.       . NON FORMULARY Allergy injections once weekly      . pravastatin (PRAVACHOL) 20 MG tablet Take 20 mg by mouth daily.      . Zoledronic Acid (RECLAST IV) Inject into the vein.      . TURMERIC PO Take by mouth.       No current facility-administered medications for this visit.     ALLERGIES: Codeine and Dilaudid  Family History  Problem Relation Age of Onset  . Varicose Veins Mother   . Heart attack Father   . Cancer Brother  colon cancer (polyps)    History   Social History  . Marital Status: Divorced    Spouse Name: N/A    Number of Children: N/A  . Years of Education: N/A   Occupational History  . Not on file.   Social History Main Topics  . Smoking status: Former Research scientist (life sciences)  . Smokeless tobacco: Never Used  . Alcohol Use: 0.5 oz/week    1 drink(s) per week     Comment: Socially   . Drug Use: No  . Sexual Activity: No   Other Topics Concern  . Not on file   Social History Narrative  . No narrative on file    ROS:  Pertinent items are noted in HPI.  PHYSICAL EXAMINATION:    BP 110/70  Pulse 76  Ht 5' 4.25" (1.632 m)  Wt 136 lb 12.8 oz (62.052 kg)  BMI 23.30 kg/m2  LMP 02/27/2000     General appearance: alert, cooperative and appears stated age  Pelvic: External genitalia:  no lesions              Urethra:  normal appearing urethra with no masses, tenderness or lesions              Bartholins and Skenes: normal                 Vagina: normal appearing vagina with normal color and discharge, no lesions.  Third degree cystocele, first degree rectocele.               Cervix:  normal appearance.  Second degree uterine prolapse.                   Bimanual Exam:  Uterus:  uterus is normal size, shape, consistency and nontender                                      Adnexa: normal adnexa in size, nontender and no masses                                      Rectovaginal: Confirms                                      Anus:  normal sphincter tone, no lesions  Patient's pessary ring with support returned to her and placed vaginally with water based lubricant.   ASSESSMENT  Complete uterovaginal prolapse. Genuine stress incontinence - potential.  Urodynamic testing did not show stress incontinence, but the patient states usually have leakage with forceful maneuvers when the pessary in in place.   PLAN  I have had a comprehensive discussion with the patient regarding prolapse and urinary incontinence.  I have provided reading materials from [employer] regarding prolapse and incontinence in general as well as medical and surgical treatment for these conditions. Medical treatments may include physical therapy, pessary use, anticholinergic/antimuscarinic therapy.   We discussed multiple routes of approach to surgery including: - hysterectomy with bilateral salpingo-oophorectomy performed by Dr. Sabra Heck. - abdominal sacrocolpopexy with permanent graft, Halban's culdoplasty, anterior and posterior colporrhaphy, TVT Exact midurethral sling with cystoscopy. - vaginal approach with anterior and posterior colporrhaphy with possible sacrospinous fixation using native tissue repair or augmentation with bovine or porcine graft, and TVT  Exact midurethral sling and cystoscopy.   Patient would like to pursue the abdominal sacrocolpopexy and combined vaginal approach.  We discussed benefits and risks of surgery which include but are not limited to bleeding, infection, damage to surrounding organs, ureteral damage, vaginal pain with intercourse, mesh erosion and exposure, dyspareunia, urinary  retention and need for prolonged catheterization and/or self catheterization, reoperation, recurrence of prolapse and incontinence,  DVT, PE, death, and reaction to anesthesia.    I have discussed surgical expectations regarding the procedures and success rates, outcomes, and recovery.     Patient wishes to proceed.  Return for pre-op visits.    An After Visit Summary was printed and given to the patient.  _40_____ minutes face to face time of which over 50% was spent in counseling.

## 2013-05-28 ENCOUNTER — Ambulatory Visit: Payer: Medicare HMO | Admitting: Obstetrics and Gynecology

## 2013-06-10 ENCOUNTER — Encounter: Payer: Self-pay | Admitting: Obstetrics and Gynecology

## 2013-07-02 ENCOUNTER — Ambulatory Visit (INDEPENDENT_AMBULATORY_CARE_PROVIDER_SITE_OTHER): Payer: Medicare HMO | Admitting: Obstetrics and Gynecology

## 2013-07-02 ENCOUNTER — Telehealth: Payer: Self-pay | Admitting: Obstetrics and Gynecology

## 2013-07-02 ENCOUNTER — Encounter: Payer: Self-pay | Admitting: Obstetrics and Gynecology

## 2013-07-02 VITALS — BP 120/70 | HR 64 | Temp 98.0°F | Wt 137.4 lb

## 2013-07-02 DIAGNOSIS — N813 Complete uterovaginal prolapse: Secondary | ICD-10-CM

## 2013-07-02 MED ORDER — OXYCODONE-ACETAMINOPHEN 5-325 MG PO TABS: 2.0000 | ORAL_TABLET | ORAL | Status: DC | PRN

## 2013-07-02 MED ORDER — IBUPROFEN 800 MG PO TABS: 800.0000 mg | ORAL_TABLET | Freq: Three times a day (TID) | ORAL | Status: DC | PRN

## 2013-07-02 NOTE — Patient Instructions (Signed)
Magnesium Citrate oral solution What is this medicine? MAGNESIUM CITRATE (mag NEE zee um SI treyt) is a saline laxative. It is used to treat occasional constipation, but it should not be used regularly for this purpose. This medicine may be used for other purposes; ask your health care provider or pharmacist if you have questions. COMMON BRAND NAME(S): Citroma What should I tell my health care provider before I take this medicine? They need to know if you have any of these conditions: -are on a low magnesium or low sodium diet -change in bowel habits for 2 weeks -colostomy or ileostomy -constipation after using another laxative for 7 days -diabetes -kidney disease -rectal bleeding -stomach pain, nausea, or vomiting -an unusual or allergic reaction to magnesium citrate, other magnesium products, other medicines, foods, dyes, or preservatives -pregnant or trying to get pregnant -breast-feeding How should I use this medicine? Take this medicine by mouth. Follow the directions on the package or prescription label. Use a specially marked spoon or container to measure each dose. Ask your pharmacist if you do not have one. Household spoons are not accurate. Drink a full glass of fluid with each dose of this medicine. This medicine may taste better if it is chilled before you drink it. Do not take your medicine more often than directed. Talk to your pediatrician regarding the use of this medicine in children. While this drug may be prescribed for children as young as 44 years of age for selected conditions, precautions do apply. Overdosage: If you think you have taken too much of this medicine contact a poison control center or emergency room at once. NOTE: This medicine is only for you. Do not share this medicine with others. What if I miss a dose? This does not apply; this medicine is not for regular use. What may interact with this medicine? -cellulose sodium phosphate -digoxin -edetate  disodium, EDTA -medicines for bone strength like etidronate, ibandronate, risedronate -sodium polystyrene sulfonate -some antibiotics like ciprofloxacin, doxycycline, gatifloxacin, levofloxacin, tetracycline -vitamin D This list may not describe all possible interactions. Give your health care provider a list of all the medicines, herbs, non-prescription drugs, or dietary supplements you use. Also tell them if you smoke, drink alcohol, or use illegal drugs. Some items may interact with your medicine. What should I watch for while using this medicine? Tell your doctor or healthcare professional if your symptoms do not start to get better or if they get worse. Do not take any other medicine by mouth within 2 hours of taking this medicine. What side effects may I notice from receiving this medicine? Side effects that you should report to your doctor or health care professional as soon as possible: -allergic reactions like skin rash, itching or hives, swelling of the face, lips, or tongue -breathing problems -chest pain -fast, irregular heartbeat -muscle weakness -nausea or vomiting Side effects that usually do not require medical attention (report to your doctor or health care professional if they continue or are bothersome): -diarrhea -stomach upset This list may not describe all possible side effects. Call your doctor for medical advice about side effects. You may report side effects to FDA at 1-800-FDA-1088. Where should I keep my medicine? Keep out of the reach of children. Store at room temperature or in the refrigerator between 8 and 30 degrees C (46 and 86 degrees F). Throw away any unused medicine 24 hours after opening the bottle. Throw away unopened bottles of medicine after the expiration date. NOTE: This sheet is a  summary. It may not cover all possible information. If you have questions about this medicine, talk to your doctor, pharmacist, or health care provider.  2014,  Elsevier/Gold Standard. (2007-09-02 17:27:50)

## 2013-07-02 NOTE — Progress Notes (Signed)
Patient ID: Danielle Lucas, female   DOB: [birth year], 73 y.o.   MRN: [id number] GYNECOLOGY VISIT  PCP:  Crist Infante, MD  Referring provider:   HPI: 73 y.o.   Divorced  Caucasian  female   [phone] with Patient's last menstrual period was 02/27/2000.   here to discuss surgery.   Wears a pessary.   Unable to remove it.   Had appendix removed at the same time as the cholecystectomy. Appendix was normal.   GYNECOLOGIC HISTORY: Patient's last menstrual period was 02/27/2000. Sexually active:  no Partner preference: female Contraception:  Tubal ligation  Menopausal hormone therapy: Premarin vaginal cream DES exposure:  no  Blood transfusions: no   Sexually transmitted diseases:   no GYN procedures and prior surgeries:  Tubal Ligation Last mammogram:  10-16-12 XVQ:MGQQP             Last pap and high risk HPV testing:  01-15-13 wnl:no HR HPV testing.  History of abnormal pap smear:  Hx of abnormal pap and cryotherapy to cervix.   OB History   Grav Para Term Preterm Abortions TAB SAB Ect Mult Living   10 4 2 2 6 2 2 2 2 2        LIFESTYLE: Exercise:   Cardio, weights and yoga          Tobacco:   no Alcohol:     Socially Drug use:   no  Patient Active Problem List   Diagnosis Date Noted  . Cystocele 01/16/2013  . Uterine prolapse 01/16/2013    Past Medical History  Diagnosis Date  . Depression   . PONV (postoperative nausea and vomiting)     pt. thinks it was from dilaudid  . Hx of colonic polyps   . Asthma     as a child  . Osteopenia     Past Surgical History  Procedure Laterality Date  . Appendectomy    . Tubal ligation    . Cholecystectomy    . Left wrist Left     left wrist, plate inserted    Current Outpatient Prescriptions  Medication Sig Dispense Refill  . aspirin EC 81 MG tablet Take 81 mg by mouth daily.      . Biotin 1 MG CAPS Take 1 mg by mouth every other day.      . Calcium-Vitamin D-Vitamin K (VIACTIV) 619-509-32 MG-UNT-MCG CHEW Chew by mouth 2  (two) times daily.      . Cholecalciferol (VITAMIN D) 2000 UNITS CAPS Take by mouth.      . conjugated estrogens (PREMARIN) vaginal cream Place 1 Applicatorful vaginally daily. Use 1/2 g vaginally every night at bed time for the first 2 weeks, then use 1/2 g vaginally two or three times per week as needed to maintain symptom relief.  60 g  0  . Cyanocobalamin (VITAMIN B-12 PO) Take by mouth daily.      Marland Kitchen escitalopram (LEXAPRO) 10 MG tablet Take 5 mg by mouth daily.       Marland Kitchen glucosamine-chondroitin 500-400 MG tablet Take 1 tablet by mouth daily.      . NON FORMULARY Allergy injections once weekly      . pravastatin (PRAVACHOL) 20 MG tablet Take 20 mg by mouth daily.      . Zoledronic Acid (RECLAST IV) Inject into the vein.       No current facility-administered medications for this visit.     ALLERGIES: Codeine and Dilaudid  Family History  Problem Relation Age of Onset  .  Varicose Veins Mother   . Heart attack Father   . Cancer Brother     colon cancer (polyps)    History   Social History  . Marital Status: Divorced    Spouse Name: N/A    Number of Children: N/A  . Years of Education: N/A   Occupational History  . Not on file.   Social History Main Topics  . Smoking status: Former Smoker -- 1.00 packs/day for 30 years    Types: Cigarettes    Quit date: 07/02/1988  . Smokeless tobacco: Never Used  . Alcohol Use: 0.5 oz/week    1 drink(s) per week     Comment: Socially   . Drug Use: No  . Sexual Activity: No   Other Topics Concern  . Not on file   Social History Narrative  . No narrative on file    ROS:  Pertinent items are noted in HPI.  PHYSICAL EXAMINATION:    BP 120/70  Pulse 64  Temp(Src) 98 F (36.7 C)  Wt 137 lb 6.4 oz (62.324 kg)  LMP 02/27/2000   Wt Readings from Last 3 Encounters:  07/02/13 137 lb 6.4 oz (62.324 kg)  05/25/13 136 lb 12.8 oz (62.052 kg)  05/13/13 138 lb (62.596 kg)     Ht Readings from Last 3 Encounters:  05/25/13 5' 4.25"  (1.632 m)  05/13/13 5' 4.25" (1.632 m)  05/07/13 5' 4.25" (1.632 m)    General appearance: alert, cooperative and appears stated age Head: Normocephalic, without obvious abnormality, atraumatic Neck: no adenopathy, supple, symmetrical, trachea midline and thyroid not enlarged, symmetric, no tenderness/mass/nodules Lungs: clear to auscultation bilaterally Breasts: Inspection negative, No nipple retraction or dimpling, No nipple discharge or bleeding, No axillary or supraclavicular adenopathy, Normal to palpation without dominant masses Heart: regular rate and rhythm Abdomen: right upper abdominal paramedian incision, small pfannenstiel incision, soft, non-tender; no masses,  no organomegaly Extremities: extremities normal, atraumatic, no cyanosis or edema Skin: Skin color, texture, turgor normal. No rashes or lesions Lymph nodes: Cervical, supraclavicular, and axillary nodes normal. No abnormal inguinal nodes palpated Neurologic: Grossly normal  Pelvic: External genitalia:  no lesions              Urethra:  normal appearing urethra with no masses, tenderness or lesions              Bartholins and Skenes: normal                 Vagina: normal appearing vagina with normal color and discharge, no lesions.  Third degree cystocele, second degree uterine prolapse, first degree rectocele.               Cervix: normal appearance                 Bimanual Exam:  Uterus:  uterus is normal size, shape, consistency and nontender.                                        Adnexa: normal adnexa in size, nontender and no masses     Ring with support pessary removed, cleansed, and replaced.                                      ASSESSMENT  Complete uterovaginal prolapse.   PLAN  Proceed  with TAH/BSO with Dr. Sabra Heck. I will be performing an abdominosacrocolpopexy, Halban's culdoplasty, anterior and posterior colporrhaphy, TVT Exact and cystoscopy.  Risks, benefits, and alternatives reviewed with the  patient who wishes to proceed.  I have discussed recovery expectations with the patient.  Magnesium citrate bowel prep.  Rx for Percocet and Motrin to patient today.  Return for pre-op with Dr. Sabra Heck.   30 minutes face to face time of which over 50% was spent in counseling.   An After Visit Summary was printed and given to the patient.

## 2013-07-02 NOTE — Telephone Encounter (Addendum)
Patient seen for pre-op consult with Dr. Quincy Simmonds today. At checkout, she requested I ask the surgery scheduler to send her pre-op instructions. Patient did not have time to stay and see if the instructions were ready for her yet.

## 2013-07-03 NOTE — Telephone Encounter (Signed)
Call to patient for clarification.  Surgery instruction sheet was reviewed and given to patient on 05-25-13.  Patient can not find this information and requests we resend.Copy of original instruction resent.  Patient's surgery is on 07-28-13. I scheduled a pre-surgery consult with Dr Quincy Simmonds that was completed yesterday.  Does she need to see Dr Sabra Heck pre-operatively?

## 2013-07-03 NOTE — Telephone Encounter (Signed)
Patient needs to have preop with Dr. Sabra Heck. Please contact her soon to schedule this because she is traveling out of the country sometime in the next few days.

## 2013-07-03 NOTE — Telephone Encounter (Signed)
Patient is scheduled to see Dr Sabra Heck on 07-24-13. She scheduled this yesterday when she left.  Thank you for advising her to do this.  Encounter closed.

## 2013-07-23 ENCOUNTER — Encounter (HOSPITAL_COMMUNITY): Payer: Self-pay

## 2013-07-23 ENCOUNTER — Encounter (HOSPITAL_COMMUNITY)
Admission: RE | Admit: 2013-07-23 | Discharge: 2013-07-23 | Disposition: A | Discharge status: Home / Self Care | Payer: Medicare HMO | Source: Ambulatory Visit | Attending: Obstetrics & Gynecology | Admitting: Obstetrics & Gynecology

## 2013-07-23 DIAGNOSIS — Z01812 Encounter for preprocedural laboratory examination: Secondary | ICD-10-CM | POA: Insufficient documentation

## 2013-07-23 DIAGNOSIS — Z01810 Encounter for preprocedural cardiovascular examination: Secondary | ICD-10-CM | POA: Insufficient documentation

## 2013-07-23 LAB — CBC
HCT: 38.3 % (ref 36.0–46.0)
Hemoglobin: 12.4 g/dL (ref 12.0–15.0)
MCH: 29.1 pg (ref 26.0–34.0)
MCHC: 32.4 g/dL (ref 30.0–36.0)
MCV: 89.9 fL (ref 78.0–100.0)
Platelets: 304 10*3/uL (ref 150–400)
RBC: 4.26 MIL/uL (ref 3.87–5.11)
RDW: 13.7 % (ref 11.5–15.5)
WBC: 6.3 10*3/uL (ref 4.0–10.5)

## 2013-07-23 LAB — ABO/RH: ABO/RH(D): A POS

## 2013-07-23 NOTE — Patient Instructions (Signed)
[place]  07/23/2013   Your procedure is scheduled on:  07/28/13  Enter through the Main Entrance of [facility] up the phone at the desk and dial [phone].   Call this number if you have problems the morning of surgery: [phone]   Remember:   Do not eat food:After Midnight.  Do not drink clear liquids: After Midnight.  Take these medicines the morning of surgery with A SIP OF WATER: NA   Do not wear jewelry, make-up or nail polish.  Do not wear lotions, powders, or perfumes. You may wear deodorant.  Do not shave 48 hours prior to surgery.  Do not bring valuables to the hospital.  [facility] is not   responsible for any belongings or valuables brought to the hospital.  Contacts, dentures or bridgework may not be worn into surgery.  Leave suitcase in the car. After surgery it may be brought to your room.  For patients admitted to the hospital, checkout time is 11:00 AM the day of              discharge.   Patients discharged the day of surgery will not be allowed to drive             home.  Name and phone number of your driver: NA  Special Instructions:      Please read over the following fact sheets that you were given:   Surgical Site Infection Prevention

## 2013-07-24 ENCOUNTER — Ambulatory Visit (INDEPENDENT_AMBULATORY_CARE_PROVIDER_SITE_OTHER): Payer: Medicare HMO | Admitting: Obstetrics & Gynecology

## 2013-07-24 ENCOUNTER — Encounter (HOSPITAL_COMMUNITY): Payer: Self-pay | Admitting: Pharmacist

## 2013-07-24 ENCOUNTER — Encounter: Payer: Self-pay | Admitting: Obstetrics & Gynecology

## 2013-07-24 VITALS — BP 120/78 | HR 68 | Resp 16 | Ht 64.25 in | Wt 138.4 lb

## 2013-07-24 DIAGNOSIS — N814 Uterovaginal prolapse, unspecified: Secondary | ICD-10-CM

## 2013-07-24 DIAGNOSIS — N8111 Cystocele, midline: Secondary | ICD-10-CM

## 2013-07-24 DIAGNOSIS — IMO0002 Reserved for concepts with insufficient information to code with codable children: Secondary | ICD-10-CM

## 2013-07-24 DIAGNOSIS — N819 Female genital prolapse, unspecified: Secondary | ICD-10-CM

## 2013-07-24 NOTE — Progress Notes (Signed)
73 y.o. [id number] DivorcedCaucasian female here for discussion of upcoming procedure.  TAH/BSO with pelvic prolpase repair with Dr. Quincy Simmonds repaired planned due to significant uterine prolapse.  Pt has seen Dr. Quincy Simmonds in consultation.  Procedure discussed with patient.  Hospital stay, recovery and pain management all discussed.  Risks discussed including but not limited to bleeding, 1% risk of receiving a  transfusion, infection, 3-4% risk of bowel/bladder/ureteral/vascular injury discussed as well as possible need for additional surgery if injury does occur discussed.  DVT/PE and rare risk of death discussed.  My actual complications with prior surgeries discussed.  Hernia formation discussed.  Positioning and incision locations discussed.  Patient aware if pathology abnormal she may need additional treatment.  All questions answered.    Ob Hx:   Patient's last menstrual period was 02/27/2000.          Sexually active: no Birth control: PMP Last pap: 01/15/13-WNL Last MMG: 10/16/12 3D-normal Tobacco: non smoker  Past Surgical History  Procedure Laterality Date  . Appendectomy    . Tubal ligation    . Cholecystectomy    . Left wrist Left     left wrist, plate inserted    Past Medical History  Diagnosis Date  . Depression   . PONV (postoperative nausea and vomiting)     pt. thinks it was from dilaudid  . Hx of colonic polyps   . Asthma     as a child  . Osteopenia     Allergies: Codeine and Dilaudid  Current Outpatient Prescriptions  Medication Sig Dispense Refill  . Biotin 1 MG CAPS Take 1 mg by mouth every other day.      . Calcium-Vitamin D-Vitamin K (VIACTIV) 119-417-40 MG-UNT-MCG CHEW Chew by mouth 2 (two) times daily.      . Cholecalciferol (VITAMIN D) 2000 UNITS CAPS Take 1 capsule by mouth daily.       Marland Kitchen conjugated estrogens (PREMARIN) vaginal cream Place 1 Applicatorful vaginally daily. Use 1/2 g vaginally every night at bed time for the first 2 weeks, then use 1/2 g  vaginally two or three times per week as needed to maintain symptom relief.  60 g  0  . Cyanocobalamin (VITAMIN B-12 PO) Take by mouth daily.      Marland Kitchen glucosamine-chondroitin 500-400 MG tablet Take 1 tablet by mouth daily.      . NON FORMULARY Allergy injections once weekly      . pravastatin (PRAVACHOL) 20 MG tablet Take 20 mg by mouth daily.      . Zoledronic Acid (RECLAST IV) Inject into the vein.      Marland Kitchen aspirin EC 81 MG tablet Take 81 mg by mouth daily.      Marland Kitchen escitalopram (LEXAPRO) 10 MG tablet Take 5 mg by mouth daily.        No current facility-administered medications for this visit.    ROS: A comprehensive review of systems was negative.  Exam:    BP 120/78  Pulse 68  Resp 16  Ht 5' 4.25" (1.632 m)  Wt 138 lb 6.4 oz (62.778 kg)  BMI 23.57 kg/m2  LMP 02/27/2000  General appearance: alert and cooperative Head: Normocephalic, without obvious abnormality, atraumatic Neck: no adenopathy, supple, symmetrical, trachea midline and thyroid not enlarged, symmetric, no tenderness/mass/nodules Lungs: clear to auscultation bilaterally Heart: regular rate and rhythm, S1, S2 normal, no murmur, click, rub or gallop Abdomen: soft, non-tender; bowel sounds normal; no masses,  no organomegaly Extremities: extremities normal, atraumatic, no cyanosis or edema  Skin: Skin color, texture, turgor normal. No rashes or lesions Lymph nodes: Cervical, supraclavicular, and axillary nodes normal. no inguinal nodes palpated Neurologic: Grossly normal  Pelvic: not performed  A: Pelvic prolapse with large cystocele     P:  TAH/BSO with pelvic reconstruction with Dr. Quincy Simmonds planned Patient already has rx for percocet Medications/Vitamins reviewed.  Pt knows needs to stop ASA.  She has been off of this about two weeks. Hysterectomy brochure given for pre and post op instructions.  ~20 minutes spent with patient >50% of time was in face to face discussion of above.

## 2013-07-27 ENCOUNTER — Telehealth: Payer: Self-pay | Admitting: Obstetrics & Gynecology

## 2013-07-27 MED ORDER — DEXTROSE 5 % IV SOLN
2.0000 g | INTRAVENOUS | Status: AC
  Administered 2013-07-28: 2 g via INTRAVENOUS
  Filled 2013-07-27: qty 2

## 2013-07-27 NOTE — Telephone Encounter (Signed)
Returned call to patient. Patient expressed concern that a letter she received from [facility] only states that her hysterectomy was authorized. Patient was questioning if her "bladder tack up" had been authorized as well. I advised the patient that when I reached out to her insurance company to obtain authorization that in addition to they hysterctomy, several other codes were provided to them for authorization. Auth was obtained for a 2 day inpatient stay.

## 2013-07-27 NOTE — H&P (Signed)
Patient ID: Danielle Lucas, female DOB: 04-13-1940, 73 y.o. MRN: [id number]   GYNECOLOGY VISIT   PCP: Crist Infante, MD  Referring provider:  Lyman Speller, MD  HPI:  73 y.o. Divorced Caucasian female  [phone] with Patient's last menstrual period was 02/27/2000.  here to discuss surgery for pelvic organ prolapse and urinary incontinence.  Has vaginal and uterine prolapse.  Wears a pessary. Unable to remove it and care for it on her own.   Leaks urine with the pessary ring in.  Leaks urine if coughs or sneezes really hard.  Cannot stop urinary flow if starts to leak.   No constipation but does push around the rectum once in a while to have bowel movements  Urodynamics on 05/13/13 - prolapse reduced with ring pessary with support.  Uroflow - Intermitten flow, 65 cc, PVR 15 cc.  CMG - S1 312 cc, S2 507 cc, Max capacity 615 cc. No leak with valsalva. Stable CMG.  UCP - 33 cm H20 and 30 cm H20.  Pressure flow - PDet ma 84 cm H20.Voided 885 cc.   Had appendix removed at the same time as the cholecystectomy.  Appendix was normal.   GYNECOLOGIC HISTORY:  Patient's last menstrual period was 02/27/2000.  Sexually active: no  Partner preference: female  Contraception: Tubal ligation  Menopausal hormone therapy: Premarin vaginal cream  DES exposure: no  Blood transfusions: no  Sexually transmitted diseases: no  GYN procedures and prior surgeries: Tubal Ligation  Last mammogram: 10-16-12 FYB:OFBPZ  Last pap and high risk HPV testing: 01-15-13 wnl:no HR HPV testing.  History of abnormal pap smear: Hx of abnormal pap and cryotherapy to cervix.   OB History    Grav  Para  Term  Preterm  Abortions  TAB  SAB  Ect  Mult  Living    10  4  2  2  6  2  2  2  2  2       LIFESTYLE:  Exercise: Cardio, weights and yoga  Tobacco: no  Alcohol: Socially  Drug use: no   Patient Active Problem List    Diagnosis  Date Noted   .  Cystocele  01/16/2013   .  Uterine prolapse  01/16/2013     Past Medical History   Diagnosis  Date   .  Depression    .  PONV (postoperative nausea and vomiting)      pt. thinks it was from dilaudid   .  Hx of colonic polyps    .  Asthma      as a child   .  Osteopenia     Past Surgical History   Procedure  Laterality  Date   .  Appendectomy     .  Tubal ligation     .  Cholecystectomy     .  Left wrist  Left      left wrist, plate inserted    Current Outpatient Prescriptions   Medication  Sig  Dispense  Refill   .  aspirin EC 81 MG tablet  Take 81 mg by mouth daily.     .  Biotin 1 MG CAPS  Take 1 mg by mouth every other day.     .  Calcium-Vitamin D-Vitamin K (VIACTIV) 025-852-77 MG-UNT-MCG CHEW  Chew by mouth 2 (two) times daily.     .  Cholecalciferol (VITAMIN D) 2000 UNITS CAPS  Take by mouth.     .  conjugated estrogens (PREMARIN) vaginal cream  Place 1 Applicatorful vaginally daily. Use 1/2 g vaginally every night at bed time for the first 2 weeks, then use 1/2 g vaginally two or three times per week as needed to maintain symptom relief.  60 g  0   .  Cyanocobalamin (VITAMIN B-12 PO)  Take by mouth daily.     Marland Kitchen  escitalopram (LEXAPRO) 10 MG tablet  Take 5 mg by mouth daily.     Marland Kitchen  glucosamine-chondroitin 500-400 MG tablet  Take 1 tablet by mouth daily.     .  NON FORMULARY  Allergy injections once weekly     .  pravastatin (PRAVACHOL) 20 MG tablet  Take 20 mg by mouth daily.     .  Zoledronic Acid (RECLAST IV)  Inject into the vein.      No current facility-administered medications for this visit.    ALLERGIES: Codeine and Dilaudid   Family History   Problem  Relation  Age of Onset   .  Varicose Veins  Mother    .  Heart attack  Father    .  Cancer  Brother      colon cancer (polyps)    History    Social History   .  Marital Status:  Divorced     Spouse Name:  N/A     Number of Children:  N/A   .  Years of Education:  N/A    Occupational History   .  Not on file.    Social History Main Topics   .  Smoking  status:  Former Smoker -- 1.00 packs/day for 30 years     Types:  Cigarettes     Quit date:  07/02/1988   .  Smokeless tobacco:  Never Used   .  Alcohol Use:  0.5 oz/week     1 drink(s) per week      Comment: Socially   .  Drug Use:  No   .  Sexual Activity:  No    Other Topics  Concern   .  Not on file    Social History Narrative   .  No narrative on file    ROS: Pertinent items are noted in HPI.   PHYSICAL EXAMINATION:   BP 120/70  Pulse 64  Temp(Src) 98 F (36.7 C)  Wt 137 lb 6.4 oz (62.324 kg)  LMP 02/27/2000  Wt Readings from Last 3 Encounters:   07/02/13  137 lb 6.4 oz (62.324 kg)   05/25/13  136 lb 12.8 oz (62.052 kg)   05/13/13  138 lb (62.596 kg)    Ht Readings from Last 3 Encounters:   05/25/13  5' 4.25" (1.632 m)   05/13/13  5' 4.25" (1.632 m)   05/07/13  5' 4.25" (1.632 m)    General appearance: alert, cooperative and appears stated age  Head: Normocephalic, without obvious abnormality, atraumatic  Neck: no adenopathy, supple, symmetrical, trachea midline and thyroid not enlarged, symmetric, no tenderness/mass/nodules  Lungs: clear to auscultation bilaterally  Heart: regular rate and rhythm  Abdomen: right upper abdominal paramedian incision, small pfannenstiel incision, soft, non-tender; no masses, no organomegaly  Extremities: extremities normal, atraumatic, no cyanosis or edema  Skin: Skin color, texture, turgor normal. No rashes or lesions  Lymph nodes: Cervical, supraclavicular, and axillary nodes normal.  No abnormal inguinal nodes palpated  Neurologic: Grossly normal   Pelvic: External genitalia: no lesions  Urethra: normal appearing urethra with no masses, tenderness or lesions  Bartholins and Skenes: normal  Vagina: normal appearing vagina with normal color and discharge, no lesions. Third degree cystocele, second degree uterine prolapse, first degree rectocele.  Cervix: normal appearance   Bimanual Exam: Uterus: uterus is normal size, shape,  consistency and nontender.  Adnexa: normal adnexa in size, nontender and no masses  Ring with support pessary removed, cleansed, and replaced.   ASSESSMENT   Complete uterovaginal prolapse.  Potential genuine stress incontinence.   PLAN   Proceed with TAH/BSO with Dr. Sabra Heck.   I will be performing an abdominosacrocolpopexy, Halban's culdoplasty, anterior and posterior colporrhaphy, TVT Exact and cystoscopy. Risks, benefits, and alternatives reviewed with the patient who wishes to proceed. I have discussed recovery expectations with the patient.   Magnesium citrate bowel prep.   Rx for Percocet and Motrin to patient today.   30 minutes face to face time of which over 50% was spent in counseling.   An After Visit Summary was printed and given to the patient.

## 2013-07-27 NOTE — Telephone Encounter (Signed)
Routing to Sally. 

## 2013-07-27 NOTE — Telephone Encounter (Signed)
Patient states she did not receive RX from Dr Quincy Simmonds for bowel prep. Discussed that this is available over the counter. One bottle of magnesium Citrate at 2pm and clear liquids after lunch.   Had questions about two pain RX she was given Discused that these are booth for post surgery and she will alternate them initially then use Motrin more as she weans down off of Percocet. Discussed prevention of post op constipation from surgery and narcotic pain meds. She will pick up Colace to have on hand for post op use if needed.  Support give for surgery tomorrow. Enc to call PRN.  Routing to provider for final review. Patient agreeable to disposition. Will close encounter

## 2013-07-27 NOTE — Telephone Encounter (Signed)
Patient calling to inquire about whether she is to do a bowel preparation prior to surgery tomorrow?  [place] [phone]

## 2013-07-28 ENCOUNTER — Inpatient Hospital Stay (HOSPITAL_COMMUNITY): Payer: Medicare HMO | Admitting: Anesthesiology

## 2013-07-28 ENCOUNTER — Encounter (HOSPITAL_COMMUNITY): Payer: Medicare HMO | Admitting: Anesthesiology

## 2013-07-28 ENCOUNTER — Encounter (HOSPITAL_COMMUNITY): Payer: Self-pay | Admitting: Anesthesiology

## 2013-07-28 ENCOUNTER — Inpatient Hospital Stay (HOSPITAL_COMMUNITY)
Admission: RE | Admit: 2013-07-28 | Discharge: 2013-07-31 | DRG: 742 | Disposition: A | Discharge status: Home / Self Care | Payer: Medicare HMO | Source: Ambulatory Visit | Attending: Obstetrics & Gynecology | Admitting: Obstetrics & Gynecology

## 2013-07-28 ENCOUNTER — Encounter (HOSPITAL_COMMUNITY)
Admission: RE | Disposition: A | Discharge status: Home / Self Care | Payer: Self-pay | Source: Ambulatory Visit | Attending: Obstetrics & Gynecology

## 2013-07-28 DIAGNOSIS — Z885 Allergy status to narcotic agent status: Secondary | ICD-10-CM

## 2013-07-28 DIAGNOSIS — F329 Major depressive disorder, single episode, unspecified: Secondary | ICD-10-CM | POA: Diagnosis present

## 2013-07-28 DIAGNOSIS — Z800 Family history of malignant neoplasm of digestive organs: Secondary | ICD-10-CM

## 2013-07-28 DIAGNOSIS — E876 Hypokalemia: Secondary | ICD-10-CM | POA: Diagnosis not present

## 2013-07-28 DIAGNOSIS — N393 Stress incontinence (female) (male): Secondary | ICD-10-CM | POA: Diagnosis present

## 2013-07-28 DIAGNOSIS — I469 Cardiac arrest, cause unspecified: Secondary | ICD-10-CM

## 2013-07-28 DIAGNOSIS — I442 Atrioventricular block, complete: Secondary | ICD-10-CM | POA: Diagnosis present

## 2013-07-28 DIAGNOSIS — N819 Female genital prolapse, unspecified: Secondary | ICD-10-CM | POA: Diagnosis present

## 2013-07-28 DIAGNOSIS — J9600 Acute respiratory failure, unspecified whether with hypoxia or hypercapnia: Secondary | ICD-10-CM

## 2013-07-28 DIAGNOSIS — Z823 Family history of stroke: Secondary | ICD-10-CM

## 2013-07-28 DIAGNOSIS — Z87891 Personal history of nicotine dependence: Secondary | ICD-10-CM

## 2013-07-28 DIAGNOSIS — N813 Complete uterovaginal prolapse: Principal | ICD-10-CM | POA: Diagnosis present

## 2013-07-28 DIAGNOSIS — D259 Leiomyoma of uterus, unspecified: Secondary | ICD-10-CM | POA: Diagnosis present

## 2013-07-28 DIAGNOSIS — D649 Anemia, unspecified: Secondary | ICD-10-CM | POA: Diagnosis not present

## 2013-07-28 DIAGNOSIS — N814 Uterovaginal prolapse, unspecified: Secondary | ICD-10-CM

## 2013-07-28 DIAGNOSIS — R0902 Hypoxemia: Secondary | ICD-10-CM

## 2013-07-28 DIAGNOSIS — N736 Female pelvic peritoneal adhesions (postinfective): Secondary | ICD-10-CM | POA: Diagnosis present

## 2013-07-28 DIAGNOSIS — Z79899 Other long term (current) drug therapy: Secondary | ICD-10-CM

## 2013-07-28 DIAGNOSIS — Z7982 Long term (current) use of aspirin: Secondary | ICD-10-CM

## 2013-07-28 DIAGNOSIS — I079 Rheumatic tricuspid valve disease, unspecified: Secondary | ICD-10-CM | POA: Diagnosis present

## 2013-07-28 DIAGNOSIS — F3289 Other specified depressive episodes: Secondary | ICD-10-CM | POA: Diagnosis present

## 2013-07-28 DIAGNOSIS — E871 Hypo-osmolality and hyponatremia: Secondary | ICD-10-CM | POA: Diagnosis not present

## 2013-07-28 DIAGNOSIS — Z888 Allergy status to other drugs, medicaments and biological substances status: Secondary | ICD-10-CM

## 2013-07-28 DIAGNOSIS — Z8249 Family history of ischemic heart disease and other diseases of the circulatory system: Secondary | ICD-10-CM

## 2013-07-28 DIAGNOSIS — J45909 Unspecified asthma, uncomplicated: Secondary | ICD-10-CM | POA: Diagnosis present

## 2013-07-28 DIAGNOSIS — R1110 Vomiting, unspecified: Secondary | ICD-10-CM

## 2013-07-28 DIAGNOSIS — IMO0002 Reserved for concepts with insufficient information to code with codable children: Secondary | ICD-10-CM

## 2013-07-28 DIAGNOSIS — R4182 Altered mental status, unspecified: Secondary | ICD-10-CM | POA: Diagnosis not present

## 2013-07-28 DIAGNOSIS — I4891 Unspecified atrial fibrillation: Secondary | ICD-10-CM

## 2013-07-28 HISTORY — PX: ABDOMINAL HYSTERECTOMY: SHX81

## 2013-07-28 HISTORY — PX: CYSTOSCOPY: SHX5120

## 2013-07-28 HISTORY — PX: BLADDER SUSPENSION: SHX72

## 2013-07-28 HISTORY — PX: ABDOMINAL SACROCOLPOPEXY: SHX1114

## 2013-07-28 SURGERY — HYSTERECTOMY, ABDOMINAL
Anesthesia: General | Site: Vagina

## 2013-07-28 MED ORDER — NALOXONE HCL 0.4 MG/ML IJ SOLN
0.4000 mg | INTRAMUSCULAR | Status: DC | PRN
  Filled 2013-07-28: qty 1

## 2013-07-28 MED ORDER — KETOROLAC TROMETHAMINE 15 MG/ML IJ SOLN
15.0000 mg | Freq: Once | INTRAMUSCULAR | Status: DC | PRN
  Filled 2013-07-28: qty 1

## 2013-07-28 MED ORDER — SODIUM CHLORIDE 0.9 % IJ SOLN: 9.0000 mL | INTRAMUSCULAR | Status: DC | PRN

## 2013-07-28 MED ORDER — LIDOCAINE-EPINEPHRINE 1 %-1:100000 IJ SOLN
INTRAMUSCULAR | Status: DC | PRN
  Administered 2013-07-28: 3 mL

## 2013-07-28 MED ORDER — MENTHOL 3 MG MT LOZG: 1.0000 | LOZENGE | OROMUCOSAL | Status: DC | PRN

## 2013-07-28 MED ORDER — ALUM & MAG HYDROXIDE-SIMETH 200-200-20 MG/5ML PO SUSP
30.0000 mL | ORAL | Status: DC | PRN
  Filled 2013-07-28: qty 30

## 2013-07-28 MED ORDER — LACTATED RINGERS IV SOLN
INTRAVENOUS | Status: DC
  Administered 2013-07-28 (×3): via INTRAVENOUS

## 2013-07-28 MED ORDER — FENTANYL CITRATE 0.05 MG/ML IJ SOLN
INTRAMUSCULAR | Status: DC | PRN
  Administered 2013-07-28 (×3): 50 ug via INTRAVENOUS
  Administered 2013-07-28: 100 ug via INTRAVENOUS
  Administered 2013-07-28: 50 ug via INTRAVENOUS
  Administered 2013-07-28: 100 ug via INTRAVENOUS

## 2013-07-28 MED ORDER — FENTANYL CITRATE 0.05 MG/ML IJ SOLN
INTRAMUSCULAR | Status: AC
  Filled 2013-07-28: qty 2

## 2013-07-28 MED ORDER — MEPERIDINE HCL 25 MG/ML IJ SOLN: 6.2500 mg | INTRAMUSCULAR | Status: DC | PRN

## 2013-07-28 MED ORDER — DIPHENHYDRAMINE HCL 50 MG/ML IJ SOLN
INTRAMUSCULAR | Status: AC
  Filled 2013-07-28: qty 1

## 2013-07-28 MED ORDER — PROPOFOL 10 MG/ML IV EMUL
INTRAVENOUS | Status: AC
  Filled 2013-07-28: qty 20

## 2013-07-28 MED ORDER — EPHEDRINE 5 MG/ML INJ
INTRAVENOUS | Status: AC
  Filled 2013-07-28: qty 10

## 2013-07-28 MED ORDER — KETOROLAC TROMETHAMINE 15 MG/ML IJ SOLN
15.0000 mg | Freq: Four times a day (QID) | INTRAMUSCULAR | Status: DC
  Filled 2013-07-28 (×3): qty 1

## 2013-07-28 MED ORDER — LIDOCAINE HCL (CARDIAC) 20 MG/ML IV SOLN
INTRAVENOUS | Status: DC | PRN
  Administered 2013-07-28: 50 mg via INTRAVENOUS

## 2013-07-28 MED ORDER — FENTANYL CITRATE 0.05 MG/ML IJ SOLN
INTRAMUSCULAR | Status: AC
  Filled 2013-07-28: qty 5

## 2013-07-28 MED ORDER — 0.9 % SODIUM CHLORIDE (POUR BTL) OPTIME
TOPICAL | Status: DC | PRN
  Administered 2013-07-28: 2000 mL

## 2013-07-28 MED ORDER — EPHEDRINE SULFATE 50 MG/ML IJ SOLN
INTRAMUSCULAR | Status: DC | PRN
  Administered 2013-07-28 (×5): 5 mg via INTRAVENOUS
  Administered 2013-07-28: 10 mg via INTRAVENOUS

## 2013-07-28 MED ORDER — MORPHINE SULFATE (PF) 1 MG/ML IV SOLN
INTRAVENOUS | Status: DC
  Administered 2013-07-28: 29 mg via INTRAVENOUS
  Administered 2013-07-28: 14:00:00 via INTRAVENOUS
  Filled 2013-07-28 (×2): qty 25

## 2013-07-28 MED ORDER — ONDANSETRON HCL 4 MG/2ML IJ SOLN: 4.0000 mg | Freq: Once | INTRAMUSCULAR | Status: DC | PRN

## 2013-07-28 MED ORDER — METHYLENE BLUE 1 % INJ SOLN
INTRAMUSCULAR | Status: AC
  Filled 2013-07-28: qty 10

## 2013-07-28 MED ORDER — DEXTROSE-NACL 5-0.45 % IV SOLN
INTRAVENOUS | Status: DC
  Administered 2013-07-28 (×2): via INTRAVENOUS

## 2013-07-28 MED ORDER — GLYCOPYRROLATE 0.2 MG/ML IJ SOLN
INTRAMUSCULAR | Status: DC | PRN
  Administered 2013-07-28: 0.6 mg via INTRAVENOUS

## 2013-07-28 MED ORDER — SODIUM CHLORIDE 0.9 % IJ SOLN
INTRAMUSCULAR | Status: AC
  Filled 2013-07-28: qty 10

## 2013-07-28 MED ORDER — DEXAMETHASONE SODIUM PHOSPHATE 10 MG/ML IJ SOLN
INTRAMUSCULAR | Status: DC | PRN
  Administered 2013-07-28: 5 mg via INTRAVENOUS

## 2013-07-28 MED ORDER — ROCURONIUM BROMIDE 100 MG/10ML IV SOLN
INTRAVENOUS | Status: AC
  Filled 2013-07-28: qty 1

## 2013-07-28 MED ORDER — ACETAMINOPHEN 160 MG/5ML PO SOLN
ORAL | Status: AC
  Administered 2013-07-28: 975 mg via ORAL
  Filled 2013-07-28: qty 40.6

## 2013-07-28 MED ORDER — ACETAMINOPHEN 325 MG PO TABS: 325.0000 mg | ORAL_TABLET | ORAL | Status: DC | PRN

## 2013-07-28 MED ORDER — ONDANSETRON HCL 4 MG/2ML IJ SOLN
4.0000 mg | Freq: Four times a day (QID) | INTRAMUSCULAR | Status: DC | PRN
  Administered 2013-07-28: 4 mg via INTRAVENOUS
  Filled 2013-07-28: qty 2

## 2013-07-28 MED ORDER — KETOROLAC TROMETHAMINE 30 MG/ML IJ SOLN
INTRAMUSCULAR | Status: AC
  Filled 2013-07-28: qty 1

## 2013-07-28 MED ORDER — ACETAMINOPHEN 160 MG/5ML PO SOLN
975.0000 mg | Freq: Once | ORAL | Status: AC
  Administered 2013-07-28: 975 mg via ORAL

## 2013-07-28 MED ORDER — KETOROLAC TROMETHAMINE 30 MG/ML IJ SOLN
INTRAMUSCULAR | Status: DC | PRN
  Administered 2013-07-28: 15 mg via INTRAVENOUS

## 2013-07-28 MED ORDER — LIDOCAINE-EPINEPHRINE 1 %-1:100000 IJ SOLN
INTRAMUSCULAR | Status: AC
  Filled 2013-07-28: qty 2

## 2013-07-28 MED ORDER — ONDANSETRON HCL 4 MG/2ML IJ SOLN
INTRAMUSCULAR | Status: DC | PRN
  Administered 2013-07-28: 4 mg via INTRAVENOUS

## 2013-07-28 MED ORDER — DIPHENHYDRAMINE HCL 50 MG/ML IJ SOLN
INTRAMUSCULAR | Status: DC | PRN
  Administered 2013-07-28: 12.5 mg via INTRAVENOUS

## 2013-07-28 MED ORDER — DEXAMETHASONE SODIUM PHOSPHATE 10 MG/ML IJ SOLN
INTRAMUSCULAR | Status: AC
  Filled 2013-07-28: qty 1

## 2013-07-28 MED ORDER — NEOSTIGMINE METHYLSULFATE 10 MG/10ML IV SOLN
INTRAVENOUS | Status: AC
  Filled 2013-07-28: qty 1

## 2013-07-28 MED ORDER — SIMETHICONE 80 MG PO CHEW
80.0000 mg | CHEWABLE_TABLET | Freq: Four times a day (QID) | ORAL | Status: DC | PRN
  Filled 2013-07-28: qty 1

## 2013-07-28 MED ORDER — KETOROLAC TROMETHAMINE 15 MG/ML IJ SOLN
15.0000 mg | Freq: Four times a day (QID) | INTRAMUSCULAR | Status: DC
  Administered 2013-07-28 – 2013-07-29 (×2): 15 mg via INTRAVENOUS
  Filled 2013-07-28 (×3): qty 1

## 2013-07-28 MED ORDER — ACETAMINOPHEN 325 MG PO TABS
650.0000 mg | ORAL_TABLET | ORAL | Status: DC | PRN
  Administered 2013-07-30 (×2): 650 mg via ORAL
  Filled 2013-07-28 (×3): qty 2

## 2013-07-28 MED ORDER — BUPIVACAINE HCL (PF) 0.5 % IJ SOLN
INTRAMUSCULAR | Status: AC
  Filled 2013-07-28: qty 270

## 2013-07-28 MED ORDER — LIDOCAINE HCL (CARDIAC) 20 MG/ML IV SOLN
INTRAVENOUS | Status: AC
  Filled 2013-07-28: qty 5

## 2013-07-28 MED ORDER — ESTRADIOL 0.1 MG/GM VA CREA
TOPICAL_CREAM | VAGINAL | Status: AC
  Filled 2013-07-28: qty 42.5

## 2013-07-28 MED ORDER — GLYCOPYRROLATE 0.2 MG/ML IJ SOLN
INTRAMUSCULAR | Status: AC
  Filled 2013-07-28: qty 3

## 2013-07-28 MED ORDER — PROPOFOL 10 MG/ML IV BOLUS
INTRAVENOUS | Status: DC | PRN
  Administered 2013-07-28: 170 mg via INTRAVENOUS

## 2013-07-28 MED ORDER — LIDOCAINE HCL 1 % IJ SOLN
INTRAMUSCULAR | Status: AC
  Filled 2013-07-28: qty 20

## 2013-07-28 MED ORDER — PANTOPRAZOLE SODIUM 40 MG IV SOLR
40.0000 mg | Freq: Every day | INTRAVENOUS | Status: DC
  Administered 2013-07-28: 40 mg via INTRAVENOUS
  Filled 2013-07-28: qty 40

## 2013-07-28 MED ORDER — ONDANSETRON HCL 4 MG/2ML IJ SOLN
INTRAMUSCULAR | Status: AC
  Filled 2013-07-28: qty 2

## 2013-07-28 MED ORDER — ROCURONIUM BROMIDE 100 MG/10ML IV SOLN
INTRAVENOUS | Status: DC | PRN
  Administered 2013-07-28: 40 mg via INTRAVENOUS
  Administered 2013-07-28: 20 mg via INTRAVENOUS

## 2013-07-28 MED ORDER — FENTANYL CITRATE 0.05 MG/ML IJ SOLN
25.0000 ug | INTRAMUSCULAR | Status: DC | PRN
  Administered 2013-07-28: 50 ug via INTRAVENOUS
  Administered 2013-07-28 (×2): 25 ug via INTRAVENOUS
  Administered 2013-07-28 (×2): 50 ug via INTRAVENOUS

## 2013-07-28 MED ORDER — NEOSTIGMINE METHYLSULFATE 10 MG/10ML IV SOLN
INTRAVENOUS | Status: DC | PRN
  Administered 2013-07-28: 3 mg via INTRAVENOUS

## 2013-07-28 MED ORDER — ACETAMINOPHEN 160 MG/5ML PO SOLN: 325.0000 mg | ORAL | Status: DC | PRN

## 2013-07-28 SURGICAL SUPPLY — 92 items
ADH SKN CLS APL DERMABOND .7 (GAUZE/BANDAGES/DRESSINGS) ×4
APL SKNCLS STERI-STRIP NONHPOA (GAUZE/BANDAGES/DRESSINGS) ×4
BENZOIN TINCTURE PRP APPL 2/3 (GAUZE/BANDAGES/DRESSINGS) ×5 IMPLANT
BLADE 11 SAFETY STRL DISP (BLADE) ×3 IMPLANT
BLADE SURG 15 STRL LF C SS BP (BLADE) ×2 IMPLANT
BLADE SURG 15 STRL SS (BLADE) ×5
CANISTER SUCT 3000ML (MISCELLANEOUS) ×10 IMPLANT
CATH FOLEY 2WAY SLVR  5CC 18FR (CATHETERS) ×1
CATH FOLEY 2WAY SLVR 5CC 18FR (CATHETERS) ×4 IMPLANT
CATH FOLEY 3WAY 30CC 18FR (CATHETERS) ×2 IMPLANT
CATH KIT ON Q 5IN DUAL SLV (PAIN MANAGEMENT) IMPLANT
CLOTH BEACON ORANGE TIMEOUT ST (SAFETY) ×7 IMPLANT
CONT PATH 16OZ SNAP LID 3702 (MISCELLANEOUS) IMPLANT
DECANTER SPIKE VIAL GLASS SM (MISCELLANEOUS) ×2 IMPLANT
DERMABOND ADVANCED (GAUZE/BANDAGES/DRESSINGS) ×1
DERMABOND ADVANCED .7 DNX12 (GAUZE/BANDAGES/DRESSINGS) ×4 IMPLANT
DEVICE CAPIO SLIM SINGLE (INSTRUMENTS) IMPLANT
DISSECTOR SPONGE CHERRY (GAUZE/BANDAGES/DRESSINGS) ×5 IMPLANT
DRAPE HYSTEROSCOPY (DRAPE) ×5 IMPLANT
DRAPE STERI URO 9X17 APER PCH (DRAPES) ×2 IMPLANT
DRAPE UNDERBUTTOCKS STRL (DRAPE) ×5 IMPLANT
DRAPE WARM FLUID 44X44 (DRAPE) ×2 IMPLANT
DRSG OPSITE POSTOP 4X10 (GAUZE/BANDAGES/DRESSINGS) ×5 IMPLANT
DRSG TEGADERM 2.38X2.75 (GAUZE/BANDAGES/DRESSINGS) IMPLANT
DRSG TELFA 3X8 NADH (GAUZE/BANDAGES/DRESSINGS) IMPLANT
DURAPREP 26ML APPLICATOR (WOUND CARE) ×5 IMPLANT
ELECT BLADE 6 FLAT ULTRCLN (ELECTRODE) IMPLANT
ELECT BLADE 6.5 EXT (BLADE) ×2 IMPLANT
FILTER STRAW FLUID ASPIR (MISCELLANEOUS) IMPLANT
GAUZE PACKING 2X5 YD STRL (GAUZE/BANDAGES/DRESSINGS) ×2 IMPLANT
GAUZE SPONGE 4X4 16PLY XRAY LF (GAUZE/BANDAGES/DRESSINGS) ×6 IMPLANT
GLOVE BIO SURGEON STRL SZ 6.5 (GLOVE) ×16 IMPLANT
GLOVE BIOGEL PI IND STRL 6.5 (GLOVE) ×4 IMPLANT
GLOVE BIOGEL PI IND STRL 7.0 (GLOVE) ×12 IMPLANT
GLOVE BIOGEL PI INDICATOR 6.5 (GLOVE) ×1
GLOVE BIOGEL PI INDICATOR 7.0 (GLOVE) ×3
GLOVE ECLIPSE 6.5 STRL STRAW (GLOVE) ×15 IMPLANT
GOWN STRL REUS W/TWL LRG LVL3 (GOWN DISPOSABLE) ×20 IMPLANT
GRAFT Y-MESH ALYTE (Mesh General) ×3 IMPLANT
NDL HYPO 25X1 1.5 SAFETY (NEEDLE) ×2 IMPLANT
NDL MAYO 6 CRC TAPER PT (NEEDLE) IMPLANT
NEEDLE HYPO 22GX1.5 SAFETY (NEEDLE) ×5 IMPLANT
NEEDLE HYPO 25X1 1.5 SAFETY (NEEDLE) ×5 IMPLANT
NEEDLE MAYO 6 CRC TAPER PT (NEEDLE) IMPLANT
NS IRRIG 1000ML POUR BTL (IV SOLUTION) ×10 IMPLANT
PACK ABDOMINAL GYN (CUSTOM PROCEDURE TRAY) ×7 IMPLANT
PACK VAGINAL WOMENS (CUSTOM PROCEDURE TRAY) ×2 IMPLANT
PAD ABD 7.5X8 STRL (GAUZE/BANDAGES/DRESSINGS) ×3 IMPLANT
PAD DRESSING TELFA 3X8 NADH (GAUZE/BANDAGES/DRESSINGS) ×3 IMPLANT
PAD OB MATERNITY 4.3X12.25 (PERSONAL CARE ITEMS) ×5 IMPLANT
PLUG CATH AND CAP STER (CATHETERS) ×5 IMPLANT
PROTECTOR NERVE ULNAR (MISCELLANEOUS) ×5 IMPLANT
SET CYSTO W/LG BORE CLAMP LF (SET/KITS/TRAYS/PACK) ×5 IMPLANT
SHEET LAVH (DRAPES) ×5 IMPLANT
SLING TVT EXACT (Sling) ×2 IMPLANT
SPONGE GAUZE 2X2 8PLY STRL LF (GAUZE/BANDAGES/DRESSINGS) IMPLANT
SPONGE GAUZE 4X4 12PLY STER LF (GAUZE/BANDAGES/DRESSINGS) ×8 IMPLANT
SPONGE LAP 18X18 X RAY DECT (DISPOSABLE) ×12 IMPLANT
SPONGE LAP 4X18 X RAY DECT (DISPOSABLE) ×2 IMPLANT
SPONGE SURGIFOAM ABS GEL 12-7 (HEMOSTASIS) ×3 IMPLANT
STAPLER VISISTAT 35W (STAPLE) ×3 IMPLANT
STRIP CLOSURE SKIN 1/2X4 (GAUZE/BANDAGES/DRESSINGS) ×8 IMPLANT
STRIP CLOSURE SKIN 1/4X3 (GAUZE/BANDAGES/DRESSINGS) IMPLANT
STRIP CLOSURE SKIN 1/4X4 (GAUZE/BANDAGES/DRESSINGS) ×3 IMPLANT
SURGIFLO TRUKIT (HEMOSTASIS) IMPLANT
SUT CAPIO ETHIBPND (SUTURE) IMPLANT
SUT ETHIBOND 0 (SUTURE) ×40 IMPLANT
SUT PDS AB 0 CT1 27 (SUTURE) IMPLANT
SUT PLAIN 2 0 XLH (SUTURE) ×2 IMPLANT
SUT VIC AB 0 CT1 18XCR BRD8 (SUTURE) ×18 IMPLANT
SUT VIC AB 0 CT1 27 (SUTURE) ×15
SUT VIC AB 0 CT1 27XBRD ANBCTR (SUTURE) ×16 IMPLANT
SUT VIC AB 0 CT1 8-18 (SUTURE) ×15
SUT VIC AB 2-0 CT1 (SUTURE) ×5 IMPLANT
SUT VIC AB 2-0 CT1 27 (SUTURE)
SUT VIC AB 2-0 CT1 TAPERPNT 27 (SUTURE) ×2 IMPLANT
SUT VIC AB 2-0 CT2 27 (SUTURE) IMPLANT
SUT VIC AB 2-0 SH 27 (SUTURE) ×15
SUT VIC AB 2-0 SH 27XBRD (SUTURE) ×15 IMPLANT
SUT VIC AB 2-0 UR6 27 (SUTURE) IMPLANT
SUT VIC AB 3-0 PS2 18 (SUTURE) ×8 IMPLANT
SUT VIC AB 3-0 PS2 18XBRD (SUTURE) ×4 IMPLANT
SUT VIC AB 4-0 PS2 18 (SUTURE) ×4 IMPLANT
SUT VICRYL 0 TIES 12 18 (SUTURE) ×5 IMPLANT
SYR 30ML LL (SYRINGE) IMPLANT
SYR 50ML LL SCALE MARK (SYRINGE) ×5 IMPLANT
TOWEL OR 17X24 6PK STRL BLUE (TOWEL DISPOSABLE) ×17 IMPLANT
TRAY FOLEY BAG SILVER LF 16FR (CATHETERS) ×5 IMPLANT
TRAY FOLEY CATH 14FR (SET/KITS/TRAYS/PACK) ×7 IMPLANT
TROCAR XCEL NON-BLD 5MMX100MML (ENDOMECHANICALS) ×1 IMPLANT
TUBING CONNECTING 10 (TUBING) ×5 IMPLANT
WATER STERILE IRR 1000ML POUR (IV SOLUTION) ×6 IMPLANT

## 2013-07-28 NOTE — Anesthesia Preprocedure Evaluation (Signed)
Anesthesia Evaluation  Patient identified by MRN, date of birth, ID band Patient awake    Reviewed: Allergy & Precautions, H&P , NPO status , Patient's Chart, lab work & pertinent test results, reviewed documented beta blocker date and time   History of Anesthesia Complications (+) PONV and history of anesthetic complications  Airway Mallampati: III TM Distance: >3 FB Neck ROM: full    Dental  (+) Teeth Intact   Pulmonary neg pulmonary ROS, former smoker,  breath sounds clear to auscultation  Pulmonary exam normal       Cardiovascular Exercise Tolerance: Good negative cardio ROS  Rhythm:regular Rate:Normal     Neuro/Psych Depression negative neurological ROS     GI/Hepatic Neg liver ROS, Bowel prep,  Endo/Other  negative endocrine ROS  Renal/GU negative Renal ROS Bladder dysfunction Female GU complaint     Musculoskeletal   Abdominal   Peds  Hematology negative hematology ROS (+)   Anesthesia Other Findings Left shoulder - careful positioning  Reproductive/Obstetrics negative OB ROS                           Anesthesia Physical Anesthesia Plan  ASA: II  Anesthesia Plan: General ETT   Post-op Pain Management:    Induction:   Airway Management Planned:   Additional Equipment:   Intra-op Plan:   Post-operative Plan:   Informed Consent: I have reviewed the patients History and Physical, chart, labs and discussed the procedure including the risks, benefits and alternatives for the proposed anesthesia with the patient or authorized representative who has indicated his/her understanding and acceptance.   Dental Advisory Given  Plan Discussed with: CRNA and Surgeon  Anesthesia Plan Comments:         Anesthesia Quick Evaluation

## 2013-07-28 NOTE — Progress Notes (Signed)
Update to History and Physical  No marked change in status.  Patient examined.   OK to proceed with surgery.  

## 2013-07-28 NOTE — Anesthesia Postprocedure Evaluation (Signed)
Anesthesia Post Note  Patient: Danielle Lucas  Procedure(s) Performed: Procedure(s) (LRB): HYSTERECTOMY ABDOMINAL with SALPINGO OOPHORECTOMY (N/A) TRANSVAGINAL TAPE (TVT) PROCEDURE midurethral sling (N/A) ABDOMINO SACROCOLPOPEXY (N/A) CYSTOSCOPY (N/A)  Anesthesia type: General  Patient location: PACU  Post pain: Pain level controlled  Post assessment: Post-op Vital signs reviewed  Last Vitals:  Filed Vitals:   07/28/13 1215  BP: 103/49  Pulse: 75  Temp:   Resp: 15    Post vital signs: Reviewed  Level of consciousness: sedated  Complications: No apparent anesthesia complications

## 2013-07-28 NOTE — OR Nursing (Signed)
Called waiting room as per instruction of Dr. Quincy Simmonds and Dr. Sabra Heck and spoke with patient's friend Stanton Kidney to update her on progress of surgery: all is well and they are finishing with first part and moving on to the second part of the surgery. @ 10.12 hrs

## 2013-07-28 NOTE — Progress Notes (Signed)
Day of Surgery Procedure(s) (LRB): HYSTERECTOMY ABDOMINAL with SALPINGO OOPHORECTOMY (N/A) TRANSVAGINAL TAPE (TVT) PROCEDURE midurethral sling (N/A) ABDOMINO SACROCOLPOPEXY (N/A) CYSTOSCOPY (N/A)  Subjective: Patient reports having issues with pain control earlier today but that is much improved.  Denies nausea.  Thirsty.  Has been drinking.  Discussed with pt surgery and procedures done.  All questions answered.    Objective: I have reviewed patient's vital signs, intake and output and medications.  General: alert and cooperative Resp: clear to auscultation bilaterally Cardio: regular rate and rhythm, S1, S2 normal, no murmur, click, rub or gallop GI: soft, occasional BS, nondistended Extremities: extremities normal, atraumatic, no cyanosis or edema and and SCDs in place and functioning Vaginal Bleeding: minimal Incsion: dressing in place, no obvious bleeding  Assessment: s/p Procedure(s) with comments: HYSTERECTOMY ABDOMINAL with SALPINGO OOPHORECTOMY (N/A) - request 4.5 hours TRANSVAGINAL TAPE (TVT) PROCEDURE midurethral sling (N/A) ABDOMINO SACROCOLPOPEXY (N/A) CYSTOSCOPY (N/A): stable  Plan: Standing and ambulation later this evening Will plan transition to oral pain meds tomorrow Advance diet as tolerated.  Has tolerated clear liquids. CBC and BMP in AM.  LOS: 0 days    Lyman Speller 07/28/2013, 6:41 PM

## 2013-07-28 NOTE — Op Note (Signed)
07/28/2013  11:37 AM  PATIENT:  Danielle Lucas  73 y.o. female  PRE-OPERATIVE DIAGNOSIS:  complete uterovaginal prolapse, Stress Urinary Incontinence  POST-OPERATIVE DIAGNOSIS:  complete uterovaginal prolapse, cystocele, Stress Urinary Incontinence  PROCEDURE:  Procedure(s): HYSTERECTOMY ABDOMINAL with SALPINGO OOPHORECTOMY TRANSVAGINAL TAPE (TVT) PROCEDURE midurethral sling ABDOMINO SACROCOLPOPEXY CYSTOSCOPY  SURGEON:  Lyman Speller  ASSISTANTS: Josefa Half   ANESTHESIA:   general  ESTIMATED BLOOD LOSS: 100cc (for entire procedure)  BLOOD ADMINISTERED:none   FLUIDS: 2800cc LR (entire procedure)  UOP: 700cc clear UOP (entire procedure)  SPECIMEN:  Uterus, cervix, bilateral tubes and ovaries  DISPOSITION OF SPECIMEN:  PATHOLOGY  FINDINGS: anterior uterine fibroid, adhesions of ovary to sidewall on both sides, otherwise normal pelvis  DESCRIPTION OF OPERATION: Patient is taken to the operating room. She is placed in the supine position initially. She is a running IV in place. Informed consent was present on the chart. SCDs on her lower extremities and functioning properly. General endotracheal anesthesia was administered by the anesthesia staff without difficulty. Once adequate anesthesia was confirmed the legs are placed in the low lithotomy position in [place].   Derma prep was then used to prep the abdomen and Betadine was used to prep the inner thighs, perineum and vagina. Once 3 minutes had past the patient was draped in a normal standard fashion. A foley catheter was placed to straight drain and kept sterile on the field.   A Pfannenstiel skin incision was made and carried down through the subcutaneous fat and tissue. Cautery was used as necessary.  The fascia was identified and nicked in the midline. The fascial incision was extended on each side with curved Mayo scissors. Kocher clamps were applied to the inferior aspect of the fascial incision.  The fascial  was dissected off the rectus muscles were inferiorly down to the level of the pubic symphysis. Then Kocher clamps were applied to the superior aspect of the fascial incision and the same was done superiorly.  The peritoneum was identified and opened sharply.  The peritoneal incision was extended superiorly and inferiorly down to the level of the bladder.  This incision was then stretched.  An abdominal retractor was placed with care taken to ensure no bowel loops were caught behind the sidewall blades.  Blue towels were placed underneath the retractor to ensure there was no pressure on pelvic muscles or nerves.  A bladder blade was applied inferiorly.  The bowel was packed anteriorly with moistened laparotomy sponges.     Ureters were identified bilaterally.  Long Kelly clamps were applied across the uteroovarian ligaments.  Attention was turned to the right side. the ureter was identified. With uterus on stretch the right round ligament was suture-ligated and incised. The posterior leaf of the broad ligament was opened. The IP ligament on the right side was isolated. Dissection of adhesions of the right ovary was necessary to make the IP ligament visible to clamp across.  Using a Heaney clamp this ligament was doubly clamped. It was then incised and suture tied first with a free-tie of #0 and then with a stich #0 Vicryl. The inferior leaf of the broad ligament was opened.  The avascular plane between the bladder and cervix was identified and the bladder flap dissection was initiated.  Once the bladder was below the level of the uterine artery the right uterine artery was skeletonized. It was then clamped. Backbleeding was clamped as well. The pedicle was incised and then doubly suture-ligated with #0 Vicryl.  Attention was turned to the left side. The left ureter was very difficult identify. We felt very safe there was below the level of where the IP ligament dissection was going to be performed. The left  round ligament was suture-ligated and opened. The posterior leaf of the broad ligament was opened. The IP ligament was isolated. As on the right side, sharp dissection of ovarian adhesions was necessary to free the ovary so the IP ligament could be traversed.  Once the left IP ligament was isolated, it clamped with a Heaney clamp, cut and doubly suture-ligated with a free-tie of #0 Vicryl and a Heaney stitch of #0 Vicryl. The inferior leaf of the broad ligament was opened and additional dissection of the bladder flap was performed.  Once the dissection was below the level of the uterine artery, the uterine artery skeletonized and doubly clamped. The pedicle was incised and doubly suture-ligated with #0 Vicryl.   The dissection of the bladder flap was continued until finally I was able to push it down with a moistened sponge stick.  The cardinal ligaments were serially clamped cut and suture-ligated with #0 Vicryl using straight Heaney clamps. At the base of the cervix a curved Heaney clamp was placed on each side. These pedicles were cut and suture-ligated with #0 Vicryl with a Heaney stitch. Care was taken to ensure the corner was adequately incorporated.  Then the remaining vaginal mucosa was incised close to the cervix as a possible using Jorgenson scissors.   The specimen was free at this point and handed off the field.   The the vagina was closed with additional figure-of-eight sutures of #0 Vicryl. The pelvis was irrigated. There was no bleeding noted.    At this point ,the procedure was then taken over by Dr. Quincy Simmonds.  See her dictation for complete description.  An abdominosacral colpopexy was performed.  Once thsi was complete the pelvis was irrigated.  No bleeding was noted.  The retractor was removed including the bladder blade.  The packing was removed.   The peritoneum was closed with a #2.0 Vicryl. The fascia was then closed from the corners to the midline with a running stitch of #0 Vicryl on each  side. The corners were locked.  The subcutaneous fat was irrigated and reapproximated with three single interrupted sutures of #0 Plain gut suture.  Finally the skin was closed with subcuticular stitch of 3-0 Vicryl. Steri-Strips were applied to the incision.  The incision was covered as Dr. Quincy Simmonds took over the case again to perform a TVT and cystoscopy.  Counts were correct two times during the proceure--after the hysterectomy was complete and at the end of the procedure.  Patient tolerated procedure well. Legs are positioned back to the supine position. Sponge, lap, needle, initially counts were correct x2. She was awakened from anesthesia, extubated and taken to recovery in stable condition.   COUNTS:  YES  PLAN OF CARE: Transfer to PACU

## 2013-07-28 NOTE — Transfer of Care (Signed)
Immediate Anesthesia Transfer of Care Note  Patient: Danielle Lucas  Procedure(s) Performed: Procedure(s) with comments: HYSTERECTOMY ABDOMINAL with SALPINGO OOPHORECTOMY (N/A) - request 4.5 hours TRANSVAGINAL TAPE (TVT) PROCEDURE midurethral sling (N/A) ABDOMINO SACROCOLPOPEXY (N/A) CYSTOSCOPY (N/A)  Patient Location: PACU  Anesthesia Type:General  Level of Consciousness: sedated and patient cooperative  Airway & Oxygen Therapy: Patient Spontanous Breathing and Patient connected to nasal cannula oxygen  Post-op Assessment: Report given to PACU RN and Post -op Vital signs reviewed and stable  Post vital signs: Reviewed and stable  Complications: No apparent anesthesia complications

## 2013-07-28 NOTE — H&P (Signed)
Danielle Lucas is an 73 y.o. female [id number] DWF here for TAH/BSO and pelvic prolapse repair.  Combined surgery is planned with Dr. Quincy Simmonds.  Risks and benefits have been discussed, in particular in regards to the hysterectomy and oophorectomy.  All questions answered and pt here and ready to proceed.  Pertinent Gynecological History: Menses: none Bleeding: none Contraception: PMP state DES exposure: denies Blood transfusions: none Sexually transmitted diseases: no past history Previous GYN Procedures: none  Last mammogram: normal Date: 10/16/12 Last pap: normal Date: 01/15/13 OB History: G10, P2   Menstrual History: Patient's last menstrual period was 02/27/2000.    Past Medical History  Diagnosis Date  . Depression   . PONV (postoperative nausea and vomiting)     pt. thinks it was from dilaudid  . Hx of colonic polyps   . Asthma     as a child  . Osteopenia     Past Surgical History  Procedure Laterality Date  . Appendectomy    . Tubal ligation    . Cholecystectomy    . Left wrist Left     left wrist, plate inserted    Family History  Problem Relation Age of Onset  . Varicose Veins Mother   . Heart attack Father   . Cancer Brother     colon cancer (polyps)    Social History:  reports that she quit smoking about 25 years ago. Her smoking use included Cigarettes. She has a 30 pack-year smoking history. She has never used smokeless tobacco. She reports that she drinks about .5 ounces of alcohol per week. She reports that she does not use illicit drugs.  Allergies:  Allergies  Allergen Reactions  . Codeine Nausea Only  . Dilaudid [Hydromorphone Hcl] Nausea And Vomiting    Prescriptions prior to admission  Medication Sig Dispense Refill  . aspirin EC 81 MG tablet Take 81 mg by mouth daily.      . Calcium-Vitamin D-Vitamin K (VIACTIV) [id number] MG-UNT-MCG CHEW Chew by mouth 2 (two) times daily.      . Cyanocobalamin (VITAMIN B-12 PO) Take by mouth daily.      Marland Kitchen  escitalopram (LEXAPRO) 10 MG tablet Take 5 mg by mouth daily.       Marland Kitchen glucosamine-chondroitin 500-400 MG tablet Take 1 tablet by mouth daily.      . pravastatin (PRAVACHOL) 20 MG tablet Take 20 mg by mouth daily.      . Biotin 1 MG CAPS Take 1 mg by mouth every other day.      . Cholecalciferol (VITAMIN D) 2000 UNITS CAPS Take 1 capsule by mouth daily.       Marland Kitchen conjugated estrogens (PREMARIN) vaginal cream Place 1 Applicatorful vaginally daily. Use 1/2 g vaginally every night at bed time for the first 2 weeks, then use 1/2 g vaginally two or three times per week as needed to maintain symptom relief.  60 g  0  . NON FORMULARY Allergy injections once weekly      . Zoledronic Acid (RECLAST IV) Inject into the vein.        Review of Systems  All other systems reviewed and are negative.   Blood pressure 139/66, pulse 64, temperature 97.5 F (36.4 C), temperature source Oral, resp. rate 20, last menstrual period 02/27/2000, SpO2 100.00%. Physical Exam  Constitutional: She is oriented to person, place, and time. She appears well-developed and well-nourished.  Cardiovascular: Normal rate and regular rhythm.   Respiratory: Effort normal and breath sounds normal.  GI:  Soft. Bowel sounds are normal.  Neurological: She is alert and oriented to person, place, and time.  Skin: Skin is warm and dry.  Psychiatric: She has a normal mood and affect.    No results found for this or any previous visit (from the past 24 hour(s)).  No results found.  Assessment/Plan: 73 yo G10 [id number] DWF here for TAH/BSO and pelvic prolapse surgery with Dr. Quincy Simmonds.  All questions answered.  Pt here and ready to proceed.  Lyman Speller 07/28/2013, 6:38 AM

## 2013-07-29 ENCOUNTER — Ambulatory Visit (HOSPITAL_COMMUNITY): Admit: 2013-07-29 | Payer: Medicare HMO | Admitting: Cardiovascular Disease

## 2013-07-29 ENCOUNTER — Ambulatory Visit (HOSPITAL_COMMUNITY): Payer: Medicare HMO

## 2013-07-29 ENCOUNTER — Other Ambulatory Visit: Payer: Self-pay

## 2013-07-29 ENCOUNTER — Encounter (HOSPITAL_COMMUNITY)
Admission: RE | Disposition: A | Discharge status: Home / Self Care | Payer: Self-pay | Source: Ambulatory Visit | Attending: Obstetrics & Gynecology

## 2013-07-29 ENCOUNTER — Encounter (HOSPITAL_COMMUNITY): Payer: Self-pay | Admitting: Obstetrics & Gynecology

## 2013-07-29 DIAGNOSIS — I4891 Unspecified atrial fibrillation: Secondary | ICD-10-CM

## 2013-07-29 DIAGNOSIS — R0902 Hypoxemia: Secondary | ICD-10-CM

## 2013-07-29 DIAGNOSIS — I469 Cardiac arrest, cause unspecified: Secondary | ICD-10-CM

## 2013-07-29 DIAGNOSIS — I442 Atrioventricular block, complete: Secondary | ICD-10-CM

## 2013-07-29 DIAGNOSIS — J9600 Acute respiratory failure, unspecified whether with hypoxia or hypercapnia: Secondary | ICD-10-CM

## 2013-07-29 DIAGNOSIS — R1110 Vomiting, unspecified: Secondary | ICD-10-CM

## 2013-07-29 HISTORY — PX: TEMPORARY PACEMAKER INSERTION: SHX5471

## 2013-07-29 LAB — TROPONIN I: Troponin I: <0.3 ng/mL (ref <0.30)

## 2013-07-29 LAB — COMPREHENSIVE METABOLIC PANEL
ALT: 50 U/L — ABNORMAL HIGH (ref 0–35)
AST: 50 U/L — ABNORMAL HIGH (ref 0–37)
Albumin: 3 g/dL — ABNORMAL LOW (ref 3.5–5.2)
Alkaline Phosphatase: 65 U/L (ref 39–117)
BUN: 7 mg/dL (ref 6–23)
CO2: 25 mEq/L (ref 19–32)
Calcium: 7.8 mg/dL — ABNORMAL LOW (ref 8.4–10.5)
Chloride: 97 mEq/L (ref 96–112)
Creatinine, Ser: 0.57 mg/dL (ref 0.50–1.10)
GFR calc Af Amer: >90 mL/min (ref >90)
GFR calc non Af Amer: >90 mL/min (ref >90)
Glucose, Bld: 129 mg/dL — ABNORMAL HIGH (ref 70–99)
Potassium: 3.3 mEq/L — ABNORMAL LOW (ref 3.7–5.3)
Sodium: 135 mEq/L — ABNORMAL LOW (ref 137–147)
Total Bilirubin: 0.5 mg/dL (ref 0.3–1.2)
Total Protein: 5.5 g/dL — ABNORMAL LOW (ref 6.0–8.3)

## 2013-07-29 LAB — CBC
HCT: 33.6 % — ABNORMAL LOW (ref 36.0–46.0)
Hemoglobin: 10.7 g/dL — ABNORMAL LOW (ref 12.0–15.0)
MCH: 28.7 pg (ref 26.0–34.0)
MCHC: 31.8 g/dL (ref 30.0–36.0)
MCV: 90.1 fL (ref 78.0–100.0)
Platelets: 265 10*3/uL (ref 150–400)
RBC: 3.73 MIL/uL — ABNORMAL LOW (ref 3.87–5.11)
RDW: 13.8 % (ref 11.5–15.5)
WBC: 14.9 10*3/uL — ABNORMAL HIGH (ref 4.0–10.5)

## 2013-07-29 LAB — MRSA PCR SCREENING: MRSA by PCR: NEGATIVE

## 2013-07-29 LAB — CK TOTAL AND CKMB (NOT AT ARMC)
CK, MB: 2.2 ng/mL (ref 0.3–4.0)
Relative Index: 1.5 (ref 0.0–2.5)
Total CK: 150 U/L (ref 7–177)

## 2013-07-29 LAB — BASIC METABOLIC PANEL
BUN: 7 mg/dL (ref 6–23)
CO2: 27 mEq/L (ref 19–32)
Calcium: 8.1 mg/dL — ABNORMAL LOW (ref 8.4–10.5)
Chloride: 100 mEq/L (ref 96–112)
Creatinine, Ser: 0.57 mg/dL (ref 0.50–1.10)
GFR calc Af Amer: >90 mL/min (ref >90)
GFR calc non Af Amer: >90 mL/min (ref >90)
Glucose, Bld: 124 mg/dL — ABNORMAL HIGH (ref 70–99)
Potassium: 4 mEq/L (ref 3.7–5.3)
Sodium: 137 mEq/L (ref 137–147)

## 2013-07-29 LAB — MAGNESIUM: Magnesium: 2.1 mg/dL (ref 1.5–2.5)

## 2013-07-29 LAB — PHOSPHORUS: Phosphorus: 1.8 mg/dL — ABNORMAL LOW (ref 2.3–4.6)

## 2013-07-29 SURGERY — TEMPORARY PACEMAKER INSERTION
Anesthesia: LOCAL

## 2013-07-29 MED ORDER — PANTOPRAZOLE SODIUM 40 MG PO TBEC
40.0000 mg | DELAYED_RELEASE_TABLET | Freq: Every day | ORAL | Status: DC
  Administered 2013-07-30 – 2013-07-31 (×2): 40 mg via ORAL
  Filled 2013-07-29 (×3): qty 1

## 2013-07-29 MED ORDER — LACTATED RINGERS IV BOLUS (SEPSIS)
500.0000 mL | Freq: Once | INTRAVENOUS | Status: AC
  Administered 2013-07-29: 500 mL via INTRAVENOUS

## 2013-07-29 MED ORDER — LACTATED RINGERS IV SOLN
INTRAVENOUS | Status: DC
  Administered 2013-07-29 – 2013-07-30 (×5): via INTRAVENOUS

## 2013-07-29 MED ORDER — FLECAINIDE ACETATE 100 MG PO TABS
200.0000 mg | ORAL_TABLET | Freq: Once | ORAL | Status: AC
  Administered 2013-07-29: 200 mg via ORAL
  Filled 2013-07-29: qty 2

## 2013-07-29 MED ORDER — PNEUMOCOCCAL VAC POLYVALENT 25 MCG/0.5ML IJ INJ: 0.5000 mL | INJECTION | INTRAMUSCULAR | Status: DC

## 2013-07-29 MED ORDER — LIDOCAINE HCL (PF) 1 % IJ SOLN
INTRAMUSCULAR | Status: AC
  Filled 2013-07-29: qty 30

## 2013-07-29 MED ORDER — SODIUM CHLORIDE 0.9 % IV SOLN
2.0000 g | Freq: Once | INTRAVENOUS | Status: AC
  Administered 2013-07-30: 2 g via INTRAVENOUS
  Filled 2013-07-29: qty 20

## 2013-07-29 MED ORDER — NALOXONE NEWBORN-WH INJECTION 0.4 MG/ML
0.4000 mg | Freq: Once | INTRAMUSCULAR | Status: DC
  Filled 2013-07-29: qty 1

## 2013-07-29 MED ORDER — MORPHINE SULFATE 15 MG PO TABS: 15.0000 mg | ORAL_TABLET | ORAL | Status: DC | PRN

## 2013-07-29 MED ORDER — ONDANSETRON HCL 4 MG/2ML IJ SOLN
4.0000 mg | Freq: Four times a day (QID) | INTRAMUSCULAR | Status: DC | PRN
  Administered 2013-07-29 (×2): 4 mg via INTRAVENOUS
  Filled 2013-07-29 (×2): qty 2

## 2013-07-29 MED ORDER — ONDANSETRON HCL 4 MG PO TABS: 4.0000 mg | ORAL_TABLET | Freq: Three times a day (TID) | ORAL | Status: DC | PRN

## 2013-07-29 MED ORDER — HEPARIN (PORCINE) IN NACL 2-0.9 UNIT/ML-% IJ SOLN
INTRAMUSCULAR | Status: AC
  Filled 2013-07-29: qty 500

## 2013-07-29 MED ORDER — POTASSIUM CHLORIDE 10 MEQ/50ML IV SOLN
10.0000 meq | INTRAVENOUS | Status: AC
  Administered 2013-07-29 – 2013-07-30 (×4): 10 meq via INTRAVENOUS
  Filled 2013-07-29 (×4): qty 50

## 2013-07-29 MED ORDER — MIDAZOLAM HCL 2 MG/2ML IJ SOLN
INTRAMUSCULAR | Status: AC
  Filled 2013-07-29: qty 2

## 2013-07-29 MED ORDER — IBUPROFEN 800 MG PO TABS
800.0000 mg | ORAL_TABLET | Freq: Three times a day (TID) | ORAL | Status: DC
  Administered 2013-07-29 – 2013-07-31 (×6): 800 mg via ORAL
  Filled 2013-07-29 (×8): qty 1

## 2013-07-29 NOTE — Anesthesia Postprocedure Evaluation (Signed)
Anesthesia Post Note  Patient: Jude Linck  Procedure(s) Performed: Procedure(s) (LRB): HYSTERECTOMY ABDOMINAL with SALPINGO OOPHORECTOMY (N/A) TRANSVAGINAL TAPE (TVT) PROCEDURE midurethral sling (N/A) ABDOMINO SACROCOLPOPEXY (N/A) CYSTOSCOPY (N/A)  Anesthesia type: General  Patient location: Mother/Baby  Post pain: Pain level controlled  Post assessment: Post-op Vital signs reviewed  Last Vitals:  Filed Vitals:   07/29/13 0722  BP: 116/53  Pulse: 67  Temp:   Resp:     Post vital signs: Reviewed  Level of consciousness: awake and alert   Complications: No apparent anesthesia complications, per Dr. Quincy Simmonds EKG this am showed atrial fib. Quincy Simmonds to transfer patient to [facility] and order consult.

## 2013-07-29 NOTE — Progress Notes (Signed)
bp was noted to be in the 70s and 80s and still SR on the monitor Dr Gwynneth Aliment notified w/ order.

## 2013-07-29 NOTE — Progress Notes (Signed)
1 Day Post-Op Procedure(s) (LRB): HYSTERECTOMY ABDOMINAL with SALPINGO OOPHORECTOMY (N/A) TRANSVAGINAL TAPE (TVT) PROCEDURE midurethral sling (N/A) ABDOMINO SACROCOLPOPEXY (N/A) CYSTOSCOPY (N/A)  Subjective/Objective: Just after patient assessed for routine post op visit, she had a period of brief unresponsiveness lasting 20 seconds and then developed profuse vomiting.  Rapid response team called and when they arrived patient alert but oriented. Narcan administered IM and IVF restarted.  Vitals BP 136/60, Pulse 88, RR 20, O2 sat 97%   Denied chest pain or shortness of breath.  About 15 minutes later, similar episode happened with rigidity and emesis through nares.  Team was at bedside already.  Heart irregularly irregular.  EKG performed showing atrial fibrillation with competing junctional pacemaker.  Plan made to transfer patient to adult intensive care unit.  Critical care team and Cardiology team called and will consult.  Cardiology on the way now.   Assessment: s/p Procedure(s) with comments: HYSTERECTOMY ABDOMINAL with SALPINGO OOPHORECTOMY (N/A) - request 4.5 hours TRANSVAGINAL TAPE (TVT) PROCEDURE midurethral sling (N/A) ABDOMINO SACROCOLPOPEXY (N/A) CYSTOSCOPY (N/A):   Respiratory depression from morphine PCA suspected. PCA infiltrated and already off. Status post Narcan 0.4 mg IM. Atrial fibrillation.   Labs - Glucose 124, Calcium 8.1.             WBC 14.9, Hgb 10.7. Plan:  Transfer to Adult ICU.   Cardiology and Critical Care team consulting.   NPO.  Will need CXR to rule out aspiration.  Ted hose and PAS on.    LOS: 1 day    [employer] de Berton Lan 07/29/2013, 7:46 AM

## 2013-07-29 NOTE — CV Procedure (Signed)
   Temporary pacemaker Procedure Note  Name: Danielle Lucas MRN: [id number] DOB: 05-02-1940  Procedure: Ultrasound guided venous access into the right internal jugular vein, temporary transvenous pacemaker placement  Indication: Recurrent episodes of asystole postoperatively after hysterectomy  Medications:  Sedation:  2 mg IV Versed   Procedural Details:  The right neck area was prepped in a sterile fashion. It was anesthetized with 1% lidocaine. Using a hand-held ultrasound, the right internal jugular vein as well as right common carotid arteries were identified. The needle was then advanced under ultrasound guidance into the right internal jugular vein. There was normal blood return. The J-tipped wire was advanced. The needle was removed and a 6 French sheath was placed. The sheath was flushed. A temporary pacemaker was then advanced under fluoroscopy into the RV apex. Pacing testing was performed. We lost capturing at an output of 0.5. The final pacemaker settings were left at a rate of 50 beats per minute with an output of 5. The pacemaker was secured in place at the 34 cm marker.   Final Conclusions:   Successful temporary pacemaker placement via the right internal jugular vein.  Recommendations:  Obtain an echocardiogram. Avoid narcotics which could have been the culprit for prolonged asystole in the setting of vomiting. Continue to monitor rhythm as the patient is still in atrial fibrillation but ventricular rate seems to be controlled.  Wellington Hampshire MD, [facility] 07/29/2013, 11:07 AM

## 2013-07-29 NOTE — Progress Notes (Signed)
GYN  Patient with fourth episode of loss of consciousness. Cardiology at bedside. Telemetry showing asystole which then quickly reversed and went back into atrial fibrillation.  Code team at bedside.  Patient will be transferred to [facility] and go directly to the Cath Lab per Cardiology.

## 2013-07-29 NOTE — Progress Notes (Signed)
Progress Note  Subjective  Patient with third episode of nausea and vomiting and rigidity of body.  Objective  Alert at oriented x 3.  Heart irregularly irregular.  Assessment  Third episode of unresponsiveness and emesis.  Atrial fibrillation.   Plan  IV Zofran administered.  Portable chest x-ray done.  STAT cardiac enzymes drawing now - CK and Troponin, CMP, phosphorus, magnesium. Cardiology called again.  Dr. Algernon Huxley will be the consulting physician to come over to [facility].

## 2013-07-29 NOTE — H&P (View-Only) (Signed)
CARDIOLOGY CONSULT NOTE  Patient ID: Danielle Lucas MRN: [id number] DOB/AGE: 11/21/1940 73 y.o.  Admit date: 07/28/2013 Reason for Consultation: Atrial fibrillation, periods of unresponsiveness  HPI: 73 yo with no past cardiac history had TAH/BSO with pelvic prolapse repair in 07/28/13.  She has been on a morphine PCA getting doses through the night.  Just after patient assessed by gynecologist this morning, she had a period of brief unresponsiveness lasting 20 seconds and then developed profuse vomiting. Rapid response team called, and when they arrived the patient was alert but oriented. Narcan administered IM and IVF restarted.  HR and BP were stable when measured.  Patient had 2 more episodes of very brief unresponsiveness.  She was not on telemetry for any of these episodes.  When an ECG was done, she was in atrial fibrillation.  This is the first time she has been noted to be in atrial fibrillation, pre-op ECG was NSR.  When I assessed her this morning, she was drowsy but responded appropriately to all questions.  No chest pain or dyspnea.  No surgical site pain.   After I walked out of the room, she went into asystole for about 30 seconds or so and was unresponsive.  She regained a pulse quickly and is now back in atrial fibrillation with HR in 90s. She was responsive again immediately after event.  She did not require CPR or intubation.    Review of systems complete and found to be negative unless listed above in HPI  Past Medical History: 1. Appendectomy 2. Cholecystectomy 3. Depression 4. Post-op nausea/vomiting 5. TAH/BSO with pelvic prolapse repair on 07/28/13  Family History  Problem Relation Age of Onset  . Varicose Veins Mother   . Heart attack Father   . Cancer Brother     colon cancer (polyps)    History   Social History  . Marital Status: Divorced    Spouse Name: N/A    Number of Children: N/A  . Years of Education: N/A   Occupational History  . Not on file.    Social History Main Topics  . Smoking status: Former Smoker -- 1.00 packs/day for 30 years    Types: Cigarettes    Quit date: 07/02/1988  . Smokeless tobacco: Never Used  . Alcohol Use: 0.5 oz/week    1 drink(s) per week     Comment: Socially   . Drug Use: No  . Sexual Activity: No   Other Topics Concern  . Not on file   Social History Narrative  . No narrative on file    Current Scheduled Meds: . ibuprofen  800 mg Oral TID  . naloxone  0.4 mg Intramuscular Once  . pantoprazole  40 mg Oral Daily   Continuous Infusions: . lactated ringers 125 mL/hr at 07/29/13 0856   PRN Meds:.acetaminophen, alum & mag hydroxide-simeth, menthol-cetylpyridinium, morphine, ondansetron (ZOFRAN) IV, ondansetron, simethicone  Prescriptions prior to admission  Medication Sig Dispense Refill  . aspirin EC 81 MG tablet Take 81 mg by mouth daily.      . Calcium-Vitamin D-Vitamin K (VIACTIV) [phone] MG-UNT-MCG CHEW Chew by mouth 2 (two) times daily.      . Cyanocobalamin (VITAMIN B-12 PO) Take by mouth daily.      Marland Kitchen escitalopram (LEXAPRO) 10 MG tablet Take 5 mg by mouth daily.       Marland Kitchen glucosamine-chondroitin 500-400 MG tablet Take 1 tablet by mouth daily.      . pravastatin (PRAVACHOL) 20 MG tablet Take 20  mg by mouth daily.      . Biotin 1 MG CAPS Take 1 mg by mouth every other day.      . Cholecalciferol (VITAMIN D) 2000 UNITS CAPS Take 1 capsule by mouth daily.       Marland Kitchen conjugated estrogens (PREMARIN) vaginal cream Place 1 Applicatorful vaginally daily. Use 1/2 g vaginally every night at bed time for the first 2 weeks, then use 1/2 g vaginally two or three times per week as needed to maintain symptom relief.  60 g  0  . NON FORMULARY Allergy injections once weekly      . Zoledronic Acid (RECLAST IV) Inject into the vein.        Physical exam Blood pressure 100/54, pulse 71, temperature 97.8 F (36.6 C), temperature source Oral, resp. rate 15, height 5' 3.5" (1.613 m), weight 59.421 kg (131  lb), last menstrual period 02/27/2000, SpO2 96.00%. General: NAD Neck: JVP 8-9 cm, no thyromegaly or thyroid nodule.  Lungs: Clear to auscultation bilaterally with normal respiratory effort. CV: Nondisplaced PMI.  Heart regular S1/S2, no S3/S4, no murmur.  No peripheral edema.  No carotid bruit.  Normal pedal pulses.  Abdomen: Soft, nontender, no hepatosplenomegaly, no distention.  Skin: Intact without lesions or rashes.  Neurologic: Drowsy but responds appropriately and follows commands.  Psych: Normal affect. Extremities: No clubbing or cyanosis.  HEENT: Normal.   Labs:   Lab Results  Component Value Date   WBC 14.9* 07/29/2013   HGB 10.7* 07/29/2013   HCT 33.6* 07/29/2013   MCV 90.1 07/29/2013   PLT 265 07/29/2013    Recent Labs Lab 07/29/13 0530  NA 137  K 4.0  CL 100  CO2 27  BUN 7  CREATININE 0.57  CALCIUM 8.1*  GLUCOSE 124*    EKG: atrial fibrillation, no significant ST changes, HR 87  ASSESSMENT AND PLAN:  73 yo with no prior cardiac history was at [facility] for TAH/BSO with vaginal prolapse repair under general anesthesia yesterday.  She had moderate to heavy use of morphine PCA with nausea/vomiting.  She had 3 brief episodes of unresponsiveness while not on telemetry and got narcan.  She was noted to be in new atrial fibrillation.  4th episode on telemetry showed 30 seconds asystole.  1. Atrial fibrillation: New.  Was in NSR on pre-op ECG.  Suspect this was triggered by stress of surgery.  - Needs echo.  - CHADSVASC = 2, likely should eventually be anticoagulated.  Hold off now with recent surgery and potential PCM need.  - HR controlled and asystole episodes, no nodal blockade.  2. Asystole: Patient had 30+ seconds asystole on telemetry this morning.  Suspect other three episodes earlier were the same.  She was not vomiting prior to the most recent episode but was nauseated.  She did have some nausea and vomiting earlier.  It is possible that these episodes are  related to heavy morphine use with nausea/vomiting (vagal-related).  She will need a temporary transvenous pacing wire until these issues are sorted out.  I am going to transfer her to the [facility] cath lab emergently to have this done.  She can be seen by EP afterwards.  3. Post-op TAH/BSO, vaginal prolapse repair: Otherwise uncomplicated, GYN to continue to follow.   Larey Dresser 07/29/2013 9:58 AM

## 2013-07-29 NOTE — Progress Notes (Signed)
Pt converted to SR,12 lead EKG done.

## 2013-07-29 NOTE — Consult Note (Signed)
PULMONARY / CRITICAL CARE MEDICINE   Name: Danielle Lucas MRN: [id number] DOB: 26-Jul-1940    ADMISSION DATE:  07/28/2013 CONSULTATION DATE:  07/28/2013  REFERRING MD :  Judeth Horn PRIMARY SERVICE: OB/GYN  CHIEF COMPLAINT:  AMS and A-fib with RVR.  BRIEF PATIENT DESCRIPTION: 73 year old female with PMH of asthma who presented to [facility] for an abdominal hysterectomy and pelvic prolapse repair.  Patient had her procedure done on 6/2 and was admitted to the floor on morphine PCA.  On 6/3 developed AMS, n/v with multiple brief episodes of unresponsiveness.  She received narcan with mild improvement.  EKG revealed AFib, cardiology consulted and during their assessment pt had episode of asystole for approx 30 seconds with unresposniveness.  Regained pulse without CPR or ACLS drugs.  She was tx to [facility] for emergent cath lab eval and PCCM consulted for assist.   SIGNIFICANT EVENTS / STUDIES:  6/2 TAH-BSO and pelvic prolapse repair. 6/3 emergent cath lab, temporary pacemaker  LINES / TUBES: PIV  CULTURES: None  ANTIBIOTICS: None  PAST MEDICAL HISTORY :  Past Medical History  Diagnosis Date  . Depression   . PONV (postoperative nausea and vomiting)     pt. thinks it was from dilaudid  . Hx of colonic polyps   . Asthma     as a child  . Osteopenia    Past Surgical History  Procedure Laterality Date  . Appendectomy    . Tubal ligation    . Cholecystectomy    . Left wrist Left     left wrist, plate inserted  . Abdominal hysterectomy N/A 07/28/2013    Procedure: HYSTERECTOMY ABDOMINAL with SALPINGO OOPHORECTOMY;  Surgeon: Lyman Speller, MD;  Location: [place] [facility];  Service: Gynecology;  Laterality: N/A;  request 4.5 hours  . Bladder suspension N/A 07/28/2013    Procedure: TRANSVAGINAL TAPE (TVT) PROCEDURE midurethral sling;  Surgeon: Everardo All Amundson de Berton Lan, MD;  Location: [place] [facility];  Service: Gynecology;  Laterality: N/A;  . Abdominal sacrocolpopexy N/A 07/28/2013    Procedure:  ABDOMINO SACROCOLPOPEXY;  Surgeon: Jamey Reas de Berton Lan, MD;  Location: [place] [facility];  Service: Gynecology;  Laterality: N/A;  . Cystoscopy N/A 07/28/2013    Procedure: CYSTOSCOPY;  Surgeon: Jamey Reas de Berton Lan, MD;  Location: [place] [facility];  Service: Gynecology;  Laterality: N/A;   Prior to Admission medications   Medication Sig Start Date End Date Taking? Authorizing Provider  aspirin EC 81 MG tablet Take 81 mg by mouth daily.   Yes Historical Provider, MD  Calcium-Vitamin D-Vitamin K (VIACTIV) [phone] MG-UNT-MCG CHEW Chew by mouth 2 (two) times daily.   Yes Historical Provider, MD  Cyanocobalamin (VITAMIN B-12 PO) Take by mouth daily.   Yes Historical Provider, MD  escitalopram (LEXAPRO) 10 MG tablet Take 5 mg by mouth daily.    Yes Historical Provider, MD  glucosamine-chondroitin 500-400 MG tablet Take 1 tablet by mouth daily.   Yes Historical Provider, MD  pravastatin (PRAVACHOL) 20 MG tablet Take 20 mg by mouth daily. 04/01/13  Yes Historical Provider, MD  Biotin 1 MG CAPS Take 1 mg by mouth every other day.    Historical Provider, MD  Cholecalciferol (VITAMIN D) 2000 UNITS CAPS Take 1 capsule by mouth daily.     Historical Provider, MD  conjugated estrogens (PREMARIN) vaginal cream Place 1 Applicatorful vaginally daily. Use 1/2 g vaginally every night at bed time for the first 2 weeks, then use 1/2 g vaginally two  or three times per week as needed to maintain symptom relief. 05/13/13   [place], MD  NON FORMULARY Allergy injections once weekly    Historical Provider, MD  Zoledronic Acid (RECLAST IV) Inject into the vein.    Historical Provider, MD   Allergies  Allergen Reactions  . Codeine Nausea Only  . Dilaudid [Hydromorphone Hcl] Nausea And Vomiting    FAMILY HISTORY:  Family History  Problem Relation Age of Onset  . Varicose Veins Mother   . Heart attack Father   . Cancer Brother     colon cancer (polyps)   SOCIAL HISTORY:   reports that she quit smoking about 25 years ago. Her smoking use included Cigarettes. She has a 30 pack-year smoking history. She has never used smokeless tobacco. She reports that she drinks about .5 ounces of alcohol per week. She reports that she does not use illicit drugs.  REVIEW OF SYSTEMS:  Positive for N/V, abdominal pain and vaginal bleeding.  Twelve point ROS is negative other than above.  SUBJECTIVE: N/V but otherwise negative.  VITAL SIGNS: Temp:  [97.3 F (36.3 C)-98.1 F (36.7 C)] 97.8 F (36.6 C) (06/03 0834) Pulse Rate:  [36-93] 47 (06/03 1027) Resp:  [9-22] 20 (06/03 0936) BP: (93-151)/(42-64) 115/64 mmHg (06/03 0939) SpO2:  [92 %-100 %] 96 % (06/03 0939) Weight:  [131 lb (59.421 kg)] 131 lb (59.421 kg) (06/02 2147)  HEMODYNAMICS:   VENTILATOR SETTINGS:   INTAKE / OUTPUT: Intake/Output     06/02 0701 - 06/03 0700 06/03 0701 - 06/04 0700   P.O. 840    I.V. (mL/kg) 4607.4 (77.5) 8.3 (0.1)   Total Intake(mL/kg) 5447.4 (91.7) 8.3 (0.1)   Urine (mL/kg/hr) 1600 (1.1)    Emesis/NG output  500 (1.8)   Blood 100 (0.1)    Total Output 1700 500   Net +3747.4 -491.7          PHYSICAL EXAMINATION: General:  Well appearing female, NAD. Neuro:  Awake, alert and interactive, moves all ext to command. HEENT:  [place]/AT, PERRL, EOM-I and MMM. Cardiovascular:  RRR, Nl S1/S2, -M/R/G. Lungs:  CTA bilaterally. Abdomen:  Soft, NT, ND and +BS. Musculoskeletal:  -edema and -tenderness. Skin:  Surgical site clear, intact.  LABS:  CBC  Recent Labs Lab 07/23/13 1110 07/29/13 0530  WBC 6.3 14.9*  HGB 12.4 10.7*  HCT 38.3 33.6*  PLT 304 265   Coag's No results found for this basename: APTT, INR,  in the last 168 hours BMET  Recent Labs Lab 07/29/13 0530 07/29/13 0900  NA 137 135*  K 4.0 3.3*  CL 100 97  CO2 27 25  BUN 7 7  CREATININE 0.57 0.57  GLUCOSE 124* 129*   Electrolytes  Recent Labs Lab 07/29/13 0530 07/29/13 0900  CALCIUM 8.1* 7.8*  MG  --  2.1   PHOS  --  1.8*   Sepsis Markers No results found for this basename: LATICACIDVEN, PROCALCITON, O2SATVEN,  in the last 168 hours ABG No results found for this basename: PHART, PCO2ART, PO2ART,  in the last 168 hours Liver Enzymes  Recent Labs Lab 07/29/13 0900  AST 50*  ALT 50*  ALKPHOS 65  BILITOT 0.5  ALBUMIN 3.0*   Cardiac Enzymes  Recent Labs Lab 07/29/13 0900  TROPONINI <0.30   Glucose No results found for this basename: GLUCAP,  in the last 168 hours  Imaging Dg Chest Port 1 View  07/29/2013   CLINICAL DATA:  Atrial fibrillation  EXAM: PORTABLE CHEST - 1 VIEW  COMPARISON:  None.  FINDINGS: There is no edema or consolidation. Heart is upper normal in size with normal pulmonary vascularity. No adenopathy. No bone lesions.  IMPRESSION: No edema or consolidation.   Electronically Signed   By: Lowella Grip M.D.   On: 07/29/2013 09:28    CXR: CXR clear.  ASSESSMENT / PLAN:  PULMONARY Concern for aspiration with vomiting but CXR appears clear right now. P:   F/u CXR  Aspiration precautions  pulm hygiene  Monitor closely for aspiration, if febrile or concern for aspiration will pan culture and start abx.Marland Kitchen  CARDIOVASCULAR Afib  Asystole - s/p temporary pacemaker.  ?r/t narcotics in setting vomiting.  P:  Cards following  Echo pending  Avoid narcotics given history telemetry  No anticoagulation for now with recent surgery, ?perm pacer needed but likely needs eventually   RENAL Hypokalemia - mild  P:   Replete electrolytes as indicated. F/u chem   GASTROINTESTINAL N/V likely related narcotic use.   P:   Antiemetic as needed  Avoid narcs as below   HEMATOLOGIC Anemia - mild  P:  F/u cbc   INFECTIOUS No s/s active infection  P:   Monitor wbc, fever curve off abx   ENDOCRINE No active issue  P:    GYN S/p TAH 6/3 P:  Per GYN    NEUROLOGIC AMS - in setting narcotics and asystole - improved  P:   Narcan gtt  Avoid narcotics   Nickolas Madrid, NP 07/29/2013  11:46 AM Pager: [phone] or [phone]  Above noted edited in full, I suspect that this was a vagal episode due to vomiting that was narcotic induced, respiratory status seems completely stable at this point.  Would keep in the ICU however for observation overnight and once able to remove the pacer then would [facility]  Would not give ANY further narcotics at this point.  I have personally obtained a history, examined the patient, evaluated laboratory and imaging results, formulated the assessment and plan and placed orders.  CRITICAL CARE: The patient is critically ill with multiple organ systems failure and requires high complexity decision making for assessment and support, frequent evaluation and titration of therapies, application of advanced monitoring technologies and extensive interpretation of multiple databases. Critical Care Time devoted to patient care services described in this note is 45 minutes.   Rush Farmer, M.D. [facility] Pulmonary/Critical Care Medicine. Pager: [phone]. After hours pager: [phone].

## 2013-07-29 NOTE — Progress Notes (Signed)
1 Day Post-Op Procedure(s) (LRB): HYSTERECTOMY ABDOMINAL with SALPINGO OOPHORECTOMY (N/A) TRANSVAGINAL TAPE (TVT) PROCEDURE midurethral sling (N/A) ABDOMINO SACROCOLPOPEXY (N/A) CYSTOSCOPY (N/A)  Subjective: Patient reports nausea.   IV just infiltrated.  Objective: I have reviewed patient's vital signs, intake and output and labs. WBC 14.9. Hgb 10.7. BMP pending.   General:  Sedated but appropriate. Resp: clear to auscultation bilaterally and  Rales in bases bilaterally. Cardio: regular rate and rhythm, S1, S2 normal, no murmur, click, rub or gallop GI: soft, non-tender; bowel sounds normal; no masses,  no organomegaly Extremities:  PAS just placed back on patient.  Ted hose on.  Vaginal Bleeding: minimal Incisions - Dressing clean, dry, intact.  Suprapubic incisions - intact with ecchymoses - mild.   Assessment: s/p Procedure(s) with comments: HYSTERECTOMY ABDOMINAL with SALPINGO OOPHORECTOMY (N/A) - request 4.5 hours TRANSVAGINAL TAPE (TVT) PROCEDURE midurethral sling (N/A) ABDOMINO SACROCOLPOPEXY (N/A) CYSTOSCOPY (N/A): stable  Plan: Advance diet Encourage ambulation Advance to PO medication Continue foley due to  post operative state Zofran prn.  Surgical finding and procedure discussed.     LOS: 1 day    [place] 07/29/2013, 6:50 AM

## 2013-07-29 NOTE — Consult Note (Signed)
ELECTROPHYSIOLOGY CONSULT NOTE   Patient ID: Danielle Lucas MRN: [id number], DOB/AGE: 1941/01/06   Admit date: 07/28/2013 Date of Consult: 07/29/2013  Primary Physician: Jerlyn Ly, MD Primary Cardiologist: new to [place] Reason for Consultation: Asystole  History of Present Illness [name] is a 73 y.o. female with no known cardiac history and asthma who underwent TAH/BSO with vaginal prolapse repair under general anesthesia yesterday. Post-operatively she has had pain treated with fentanyl and morphine injections, ultimately requiring morphine via PCA pump. She reports this AM she was "feeling twinges" of pain in her abdomen and pushed the button to her morphine pump twice. Following this she developed severe nausea and vomited twice. She then was unresponsive briefly. Per notes, she had emesis through nares and also became "rigid." Rapid response team was called and when they arrived [name] was alert but disoriented. Narcan was administered IM and IVFs started. She then had 2 more brief unresponsive episodes; however, she was not on telemetry for any of these episodes. 12-lead ECG was done after the third episode which revealed atrial fibrillation which is new for her. Pre-op ECG shows NSR. She was placed on telemetry and Cardiology was called. She experienced a fourth episode and telemetry revealed atrial fibrillation with CHB / asystole for 30 seconds. She was responsive immediately after the episode and did not require CPR.   [name] reports she "always wakes up sick after surgery." She has had cholecystectomy, tubal ligation and more recently orthopedic surgery 3 years ago with similar symptoms post-operatively. She states, "When I had my wrist surgery I did the same thing and they wanted to send me over to [facility] and I declined." She is unsure if she had any arrhythmias documented at that time.  She is quite active. She exercises four times per week at the [facility].  She also works one day per week. She states she is able to perform all activities without limitation / difficulty. She denies fatigue or dizziness. She has experienced syncope before which occurred while she was having a cortisone injection in her elbow. She denies any other episodes of syncope, dizziness or near syncope. She denies history of CAD/MI, valvular heart disease or rheumatic fever, or CHF.   Past Medical History Past Medical History  Diagnosis Date  . Depression   . PONV (postoperative nausea and vomiting)     pt. thinks it was from dilaudid  . Hx of colonic polyps   . Asthma     as a child  . Osteopenia     Past Surgical History Past Surgical History  Procedure Laterality Date  . Appendectomy    . Tubal ligation    . Cholecystectomy    . Left wrist Left     left wrist, plate inserted  . Abdominal hysterectomy N/A 07/28/2013    Procedure: HYSTERECTOMY ABDOMINAL with SALPINGO OOPHORECTOMY;  Surgeon: Lyman Speller, MD;  Location: [facility];  Service: Gynecology;  Laterality: N/A;  request 4.5 hours  . Bladder suspension N/A 07/28/2013    Procedure: TRANSVAGINAL TAPE (TVT) PROCEDURE midurethral sling;  Surgeon: Everardo All Amundson de Berton Lan, MD;  Location: [facility];  Service: Gynecology;  Laterality: N/A;  . Abdominal sacrocolpopexy N/A 07/28/2013    Procedure: ABDOMINO SACROCOLPOPEXY;  Surgeon: Jamey Reas de Berton Lan, MD;  Location: [facility] [facility];  Service: Gynecology;  Laterality: N/A;  . Cystoscopy N/A 07/28/2013    Procedure: CYSTOSCOPY;  Surgeon: Jamey Reas de Berton Lan, MD;  Location: [facility];  Service: Gynecology;  Laterality: N/A;    Allergies/Intolerances Allergies  Allergen Reactions  . Codeine Nausea Only  . Dilaudid [Hydromorphone Hcl] Nausea And Vomiting    Current Home Medications      aspirin EC 81 MG tablet  Take 81 mg by mouth daily.     Biotin 1 MG Caps  Take 1 mg by mouth every other day.     conjugated estrogens vaginal  cream  Commonly known as:  PREMARIN  Place 1 Applicatorful vaginally daily. Use 1/2 g vaginally every night at bed time for the first 2 weeks, then use 1/2 g vaginally two or three times per week as needed to maintain symptom relief.     escitalopram 10 MG tablet  Commonly known as:  LEXAPRO  Take 5 mg by mouth daily.     glucosamine-chondroitin 500-400 MG tablet  Take 1 tablet by mouth daily.     NON FORMULARY  Allergy injections once weekly     pravastatin 20 MG tablet  Commonly known as:  PRAVACHOL  Take 20 mg by mouth daily.     RECLAST IV  Inject into the vein.     VIACTIV [phone] MG-UNT-MCG Chew  Generic drug:  Calcium-Vitamin D-Vitamin K  Chew by mouth 2 (two) times daily.     VITAMIN B-12 PO  Take by mouth daily.     Vitamin D 2000 UNITS Caps  Take 1 capsule by mouth daily.     Inpatient Medications . ibuprofen  800 mg Oral TID  . pantoprazole  40 mg Oral Daily   . lactated ringers 125 mL/hr at 07/29/13 1400    Family History Family History  Problem Relation Age of Onset  . Varicose Veins Mother   . Heart attack Father   . Cancer Brother     colon cancer (polyps)     Social History History   Social History  . Marital Status: Divorced    Spouse Name: N/A    Number of Children: N/A  . Years of Education: N/A   Occupational History  . Not on file.   Social History Main Topics  . Smoking status: Former Smoker -- 1.00 packs/day for 30 years    Types: Cigarettes    Quit date: 07/02/1988  . Smokeless tobacco: Never Used  . Alcohol Use: 0.5 oz/week    1 drink(s) per week     Comment: Socially   . Drug Use: No  . Sexual Activity: No   Other Topics Concern  . Not on file   Social History Narrative  . No narrative on file     Review of Systems General: No chills, fever, night sweats or weight changes  Cardiovascular:  No chest pain, dyspnea on exertion, edema, orthopnea, palpitations, paroxysmal nocturnal dyspnea Dermatological: No  rash, lesions or masses Respiratory: No cough, dyspnea Urologic: No hematuria, dysuria Abdominal: No nausea, vomiting, diarrhea, bright red blood per rectum, melena, or hematemesis Neurologic: No visual changes, weakness, changes in mental status All other systems reviewed and are otherwise negative except as noted above.  Physical Exam Vitals: Blood pressure 94/61, pulse 85, temperature 97.6 F (36.4 C), temperature source Oral, resp. rate 16, height 5\' 3"  (1.6 m), weight 144 lb 10 oz (65.6 kg), last menstrual period 02/27/2000, SpO2 98.00%.  General: Well developed, well appearing 73 y.o. female in no acute distress. HEENT: Normocephalic, atraumatic. EOMs intact. Sclera nonicteric. Oropharynx clear.  Neck: Supple. No JVD. Lungs: Respirations regular and unlabored, CTA  bilaterally. No wheezes, rales or rhonchi. Heart: Irregular. S1, S2 present. No murmurs, rub, S3 or S4. Abdomen: Appears soft, non-distended. Palpation deferred due to recent surgery. Extremities: No clubbing, cyanosis or edema. DP/PT/Radials 2+ and equal bilaterally. Psych: Normal affect. Neuro: Alert and oriented X 3. Moves all extremities spontaneously. Musculoskeletal: No kyphosis. Skin: Intact. Warm and dry. No rashes or petechiae in exposed areas.   Labs  Recent Labs  07/29/13 0900  CKTOTAL 150  CKMB 2.2  TROPONINI <0.30   Lab Results  Component Value Date   WBC 14.9* 07/29/2013   HGB 10.7* 07/29/2013   HCT 33.6* 07/29/2013   MCV 90.1 07/29/2013   PLT 265 07/29/2013    Recent Labs Lab 07/29/13 0900  NA 135*  K 3.3*  CL 97  CO2 25  BUN 7  CREATININE 0.57  CALCIUM 7.8*  PROT 5.5*  BILITOT 0.5  ALKPHOS 65  ALT 50*  AST 50*  GLUCOSE 129*    Radiology/Studies Dg Chest Port 1 View 07/29/2013    FINDINGS: There is no edema or consolidation. Heart is upper normal in size with normal pulmonary vascularity. No adenopathy. No bone lesions.   IMPRESSION: No edema or consolidation.    Electronically Signed    By: Lowella Grip M.D.   On: 07/29/2013 09:28   Echocardiogram  Pending  12-lead ECG preoperative 07/23/2013 - NSR with normal intervals 12-lead ECG today - atrial fibrillation with V rate 87 bpm; no ST-T wave abnormalities Telemetry reviewed - persistent atrial fibrillation with CHB / asystole ~30 seconds in duration this AM  Assessment and Plan Atrial fibrillation with CHB / asystole, post-operatively in setting of pain with severe nausea, vomiting and narcotic use Ms. Giacobbe's asystolic event was most likely vagally mediated in setting of pain with severe nausea, vomiting and morphine use. On telemetry, she has AFib with slowing V rates leading up to the event. Agree with echo. Check TSH. Avoid rate slowing medications / AV nodal blocking medications. She remains in atrial fibrillation which is new for her and is currently rate controlled with V rate in 80s. This may have been triggered by recent surgery. She is unaware of AFib and denies CP, SOB or palpitations. Chads2-vasc score is at least 2 (echo pending to assess LVEF) and she will likely need chronic anticoagulation; however, will defer this for now until stable from surgical standpoint.   SignedAndrez Grime, PA-C 07/29/2013, 2:54 PM  EP Attending  Patient seen and examined. Agree with above. She has reflex syncope with a predominance of cardiac inhibition. These episodes occur with nausea/pain and are usually in the setting of a medical procedure. In addition, she has developed atrial fibrillation (questionable vagally mediated) and a variable ventricular rate. She has a temporary PM placed earlier. I would recommend keeping the PM in today, giving flecainide for atrial fib, using NSAIDs for pain, and avoiding AV nodal blocking drugs for now. She will continue to need anti-coagulation as her mother had atrial fib and multiple strokes before dying of a stroke.  Mikle Bosworth.D.

## 2013-07-29 NOTE — Addendum Note (Signed)
Addendum created 07/29/13 0747 by Talbot Grumbling, CRNA   Modules edited: Notes Section   Notes Section:  File: [id number]

## 2013-07-29 NOTE — Interval H&P Note (Signed)
History and Physical Interval Note:  07/29/2013 10:25 AM  Danielle Lucas  has presented today for surgery, with the diagnosis of as  The various methods of treatment have been discussed with the patient and family. After consideration of risks, benefits and other options for treatment, the patient has consented to  Procedure(s): TEMPORARY PACEMAKER INSERTION (N/A) as a surgical intervention .  The patient's history has been reviewed, patient examined, no change in status, stable for surgery.  I have reviewed the patient's chart and labs.  Questions were answered to the patient's satisfaction.     Wellington Hampshire

## 2013-07-29 NOTE — Progress Notes (Signed)
Day of Surgery Procedure(s) (LRB): TEMPORARY PACEMAKER INSERTION (N/A)  Subjective: Patient has no complaints except that she is feeling cold.  No nausea since this morning.  Pain is under good control.  States it is a 1-2 on pain scale.  D/W pt plan of care after discussing with Dr. Aundra Dubin.  Echo today.  Monitoring to see if she comes out of afib.  If not, will go on oral anticoagulation like Xarelto and then cardioversion in a month or so.      Objective: I have reviewed patient's vital signs, intake and output, medications and labs.  General: alert and cooperative Resp: clear to auscultation bilaterally Cardio: regular rate and rhythm, S1, S2 normal, no murmur, click, rub or gallop GI: soft, non-tender; bowel sounds normal; no masses,  no organomegaly and incision: clean, dry and intact Extremities: extremities normal, atraumatic, no cyanosis or edema Vaginal Bleeding: none  Assessment: s/p Procedure(s): TEMPORARY PACEMAKER INSERTION (N/A): stable  Plan: Advance diet Continued monitoring via Cardiology Motrin for pain control.  No narcotics if possible. Continue SCDs CBC, BMP, Phosphorus planned for tomorrow  LOS: 1 day    Lyman Speller 07/29/2013, 4:35 PM

## 2013-07-29 NOTE — Consult Note (Signed)
CARDIOLOGY CONSULT NOTE  Patient ID: Danielle Lucas MRN: [id number] DOB/AGE: 11/21/1940 73 y.o.  Admit date: 07/28/2013 Reason for Consultation: Atrial fibrillation, periods of unresponsiveness  HPI: 73 yo with no past cardiac history had TAH/BSO with pelvic prolapse repair in 07/28/13.  She has been on a morphine PCA getting doses through the night.  Just after patient assessed by gynecologist this morning, she had a period of brief unresponsiveness lasting 20 seconds and then developed profuse vomiting. Rapid response team called, and when they arrived the patient was alert but oriented. Narcan administered IM and IVF restarted.  HR and BP were stable when measured.  Patient had 2 more episodes of very brief unresponsiveness.  She was not on telemetry for any of these episodes.  When an ECG was done, she was in atrial fibrillation.  This is the first time she has been noted to be in atrial fibrillation, pre-op ECG was NSR.  When I assessed her this morning, she was drowsy but responded appropriately to all questions.  No chest pain or dyspnea.  No surgical site pain.   After I walked out of the room, she went into asystole for about 30 seconds or so and was unresponsive.  She regained a pulse quickly and is now back in atrial fibrillation with HR in 90s. She was responsive again immediately after event.  She did not require CPR or intubation.    Review of systems complete and found to be negative unless listed above in HPI  Past Medical History: 1. Appendectomy 2. Cholecystectomy 3. Depression 4. Post-op nausea/vomiting 5. TAH/BSO with pelvic prolapse repair on 07/28/13  Family History  Problem Relation Age of Onset  . Varicose Veins Mother   . Heart attack Father   . Cancer Brother     colon cancer (polyps)    History   Social History  . Marital Status: Divorced    Spouse Name: N/A    Number of Children: N/A  . Years of Education: N/A   Occupational History  . Not on file.    Social History Main Topics  . Smoking status: Former Smoker -- 1.00 packs/day for 30 years    Types: Cigarettes    Quit date: 07/02/1988  . Smokeless tobacco: Never Used  . Alcohol Use: 0.5 oz/week    1 drink(s) per week     Comment: Socially   . Drug Use: No  . Sexual Activity: No   Other Topics Concern  . Not on file   Social History Narrative  . No narrative on file    Current Scheduled Meds: . ibuprofen  800 mg Oral TID  . naloxone  0.4 mg Intramuscular Once  . pantoprazole  40 mg Oral Daily   Continuous Infusions: . lactated ringers 125 mL/hr at 07/29/13 0856   PRN Meds:.acetaminophen, alum & mag hydroxide-simeth, menthol-cetylpyridinium, morphine, ondansetron (ZOFRAN) IV, ondansetron, simethicone  Prescriptions prior to admission  Medication Sig Dispense Refill  . aspirin EC 81 MG tablet Take 81 mg by mouth daily.      . Calcium-Vitamin D-Vitamin K (VIACTIV) [phone] MG-UNT-MCG CHEW Chew by mouth 2 (two) times daily.      . Cyanocobalamin (VITAMIN B-12 PO) Take by mouth daily.      Marland Kitchen escitalopram (LEXAPRO) 10 MG tablet Take 5 mg by mouth daily.       Marland Kitchen glucosamine-chondroitin 500-400 MG tablet Take 1 tablet by mouth daily.      . pravastatin (PRAVACHOL) 20 MG tablet Take 20  mg by mouth daily.      . Biotin 1 MG CAPS Take 1 mg by mouth every other day.      . Cholecalciferol (VITAMIN D) 2000 UNITS CAPS Take 1 capsule by mouth daily.       Marland Kitchen conjugated estrogens (PREMARIN) vaginal cream Place 1 Applicatorful vaginally daily. Use 1/2 g vaginally every night at bed time for the first 2 weeks, then use 1/2 g vaginally two or three times per week as needed to maintain symptom relief.  60 g  0  . NON FORMULARY Allergy injections once weekly      . Zoledronic Acid (RECLAST IV) Inject into the vein.        Physical exam Blood pressure 100/54, pulse 71, temperature 97.8 F (36.6 C), temperature source Oral, resp. rate 15, height 5' 3.5" (1.613 m), weight 59.421 kg (131  lb), last menstrual period 02/27/2000, SpO2 96.00%. General: NAD Neck: JVP 8-9 cm, no thyromegaly or thyroid nodule.  Lungs: Clear to auscultation bilaterally with normal respiratory effort. CV: Nondisplaced PMI.  Heart regular S1/S2, no S3/S4, no murmur.  No peripheral edema.  No carotid bruit.  Normal pedal pulses.  Abdomen: Soft, nontender, no hepatosplenomegaly, no distention.  Skin: Intact without lesions or rashes.  Neurologic: Drowsy but responds appropriately and follows commands.  Psych: Normal affect. Extremities: No clubbing or cyanosis.  HEENT: Normal.   Labs:   Lab Results  Component Value Date   WBC 14.9* 07/29/2013   HGB 10.7* 07/29/2013   HCT 33.6* 07/29/2013   MCV 90.1 07/29/2013   PLT 265 07/29/2013    Recent Labs Lab 07/29/13 0530  NA 137  K 4.0  CL 100  CO2 27  BUN 7  CREATININE 0.57  CALCIUM 8.1*  GLUCOSE 124*    EKG: atrial fibrillation, no significant ST changes, HR 87  ASSESSMENT AND PLAN:  73 yo with no prior cardiac history was at [facility] for TAH/BSO with vaginal prolapse repair under general anesthesia yesterday.  She had moderate to heavy use of morphine PCA with nausea/vomiting.  She had 3 brief episodes of unresponsiveness while not on telemetry and got narcan.  She was noted to be in new atrial fibrillation.  4th episode on telemetry showed 30 seconds asystole.  1. Atrial fibrillation: New.  Was in NSR on pre-op ECG.  Suspect this was triggered by stress of surgery.  - Needs echo.  - CHADSVASC = 2, likely should eventually be anticoagulated.  Hold off now with recent surgery and potential PCM need.  - HR controlled and asystole episodes, no nodal blockade.  2. Asystole: Patient had 30+ seconds asystole on telemetry this morning.  Suspect other three episodes earlier were the same.  She was not vomiting prior to the most recent episode but was nauseated.  She did have some nausea and vomiting earlier.  It is possible that these episodes are  related to heavy morphine use with nausea/vomiting (vagal-related).  She will need a temporary transvenous pacing wire until these issues are sorted out.  I am going to transfer her to the [facility] cath lab emergently to have this done.  She can be seen by EP afterwards.  3. Post-op TAH/BSO, vaginal prolapse repair: Otherwise uncomplicated, GYN to continue to follow.   Larey Dresser 07/29/2013 9:58 AM

## 2013-07-29 NOTE — Op Note (Signed)
NAMEEVERLEIGH, COLCLASURE NO.:  [id number]  MEDICAL RECORD NO.:  [id number]  LOCATION:  3235                          FACILITY:  [facility]  PHYSICIAN:  Lenard Galloway, M.D.   DATE OF BIRTH:  1941-02-26  DATE OF PROCEDURE:  07/28/2013 DATE OF DISCHARGE:                              OPERATIVE REPORT   PREOPERATIVE DIAGNOSIS:  Complete uterovaginal prolapse, genuine stress incontinence.  POSTOPERATIVE DIAGNOSIS:  Complete uterovaginal prolapse, genuine stress incontinence.  PROCEDURE:  Abdominal sacrocolpopexy, tension-free vaginal tape Exact mid urethral sling, cystoscopy.  SURGEON:  Lenard Galloway, M.D.  ASSISTANT:  Megan Salon, M.D.  ANESTHESIA:  General endotracheal.  ESTIMATED BLOOD LOSS:  50 mL.  FLUIDS:  Total IV fluids for entire procedure, 2800 mL Ringer's lactate.  URINE OUTPUT:  700 mL of clear urine for entire procedure.  COMPLICATIONS:  None.  INDICATIONS FOR THE PROCEDURE:  The patient is a 73 year old gravida 88, para 2-2-6-2 Caucasian female, who presented with a vaginal and uterine prolapse.  The patient had been managed with a pessary and was not able to care for the pessary and she therefore requested definitive surgical evaluation and treatment.  The patient was experiencing urinary incontinence with the pessary in place.  The leakage was occurring with coughing and sneezing.  The patient had multichannel urodynamic testing in the office with the pessary reducing the prolapse and she did not demonstrate stress incontinence.  On physical examination, the patient was noted to have a third-degree cystocele, second-degree uterine, prolapse, and a first-degree rectocele.  The plan is now made to proceed with a total abdominal hysterectomy with bilateral salpingo-oophorectomy under the direction of the patient's primary gynecologist, Dr. Edwinna Areola.  I will then be proceeding with an abdominal sacrocolpopexy, a possible Halban culdoplasty,  a TVT mid urethral sling, cystoscopy, and a possible anterior and posterior colporrhaphy.  Risks, benefits, and alternatives have been reviewed with the patient who wishes to proceed.  FINDINGS:  Examination under anesthesia revealed a pessary in place which was removed and discarded.  The patient was noted to have a third- degree cystocele and a second-degree uterine prolapse.  There is a minimal rectocele appreciated.  The uterus was small and no adnexal masses were appreciated.  Cystoscopy at termination of the procedure demonstrated the bladder to be intact throughout 360 degrees including the bladder dome and trigone. There was no evidence of a foreign body in the bladder or the urethra. The ureters were noted to be patent bilaterally.  DESCRIPTION OF PROCEDURE:  The patient was reidentified in the preoperative hold area.  She did receive cefotetan 2 g IV for antibiotic prophylaxis.  She received TED hose and PAS stockings for DVT prophylaxis.  The patient was placed in the supine position on the operating room table and she underwent general endotracheal anesthesia.  The patient was then placed in the dorsal lithotomy position with the [facility] stirrups.  The abdomen and the vagina were sterilely prepped and the patient was draped.  Dr. Sabra Heck at this time proceeded with a total abdominal hysterectomy. Please refer to this dictation separately.  At the termination of the hysterectomy, the vaginal cuff was closed and hemostasis was good.  The patient already had the self-retaining retractor and the bowel packed into the upper abdomen with moistened lap pads.  The Foley catheter was left to gravity drainage previously and this was maintained throughout the patient's remainder of her surgery.  An EEA Sizer was next placed inside the vagina and the vaginal vault was pushed up, so that the dissection could begin anteriorly and posteriorly along the vagina.  Sharp and blunt  dissection were used to dissect the peritoneum off the anterior and posterior vaginal walls.  Hemostasis was created with monopolar cautery for bleeding tissue underneath the peritoneum posteriorly.  The dissection was carried down posteriorly along the vaginal wall all the way into the cul-de-sac and slightly onto the patient's right hand side. At this time, the sacral promontory was exposed.  The peritoneum was elevated with two smooth pickups and it was incised vertically.  The anterior longitudinal ligaments were then cleared by using a combination of sharp and blunt dissection.  The right ureter and the left iliac vessels were noted to be well out of the area of the dissection.  The peritoneum was opened on the patient's right sidewall all the way down to the level of the cul-de-sac where the peritoneum had been previously opened.  Two sutures of 0 Ethibond were then placed in the anterior longitudinal ligaments just above the sacral promontory to the right and to the left of the midline.  The middle sacral vessels were noted and well out of the area of dissection.  At this time, the Alyte mesh was secured to the anterior and posterior vaginal walls using simple sutures of 0 Ethibond,  seven sutures were placed anteriorly and five sutures were placed posteriorly.  The EEA Sizer was then removed from the vagina and a gloved hand was placed vaginally to determine the amount of elevation of the vaginal cuff.  The top of the mesh was then marked and the suture over the sacral promontory was fed through the mesh and was tied.  This provided excellent elevation and support of the vagina.  Hemostasis was good at this time.  The graft was therefore covered with peritoneum.  A running suture of 2-0 Vicryl was used to cover the mesh down to the level of the vaginal cuff.  The remaining peritoneum was then closed in a transverse fashion going from the round ligament on the patient's  right-hand side over to the round ligament on the patient's left-hand side.  The peritoneum over the bladder and the vaginal cuff now completely covered the mesh.  The pelvis was irrigated and suctioned and had good hemostasis.  There was some rundown noted from above, but there were no areas of any active bleeding appreciated.  A self-retaining retractor and the lap pads were removed from the peritoneal cavity at this time.  The closure of the abdominal wall was performed by Dr. Sabra Heck at this time and please refer to this dictation separately.  The patient was placed in the high lithotomy position at this time.  She was examined under anesthesia and was noted to have excellent support of the vaginal cuff.  There was some minimal cystocele noted at this time and the posterior vaginal wall had good support.  Allis clamps were used to mark the midline of the anterior vaginal wall, beginning 1 cm below the urethral meatus and extending over a distance of 4 cm.  The mucosa was injected locally with 1% lidocaine with epinephrine 1:100,000.  The mucosa was then incised vertically  in the midline with a scalpel.  Sharp and blunt dissection were used to dissect some vaginal tissue and bladder off the overlying mucosa bilaterally. The dissection was carried back to the level of the pubic rami.  A 1-cm suprapubic incisions were then created, 2 cm to the right and left of the midline.  A Foley catheter was removed.  The Foley with the obturator guide was then placed inside the urethra and bladder.  The bag was deflected properly, so that the TVT Exact could be placed in the bottom up fashion.  The needle guide was first placed through the retropubic space on the patient's right-hand side and then up through the suprapubic incision on the ipsilateral side.  The urethra was then deflected in the opposite direction and the same was performed on the left-hand side, so that the sling was noted to be  in good position.  The obturator guide was removed at this time and cystoscopy was performed and the left ureter was noted to have good efflux, the right did not at this time.  There was no evidence of a foreign body in the bladder or the urethra.  The Allis were noted to be providing a lot of weight of the anterior vaginal wall.  The sling was brought up through the suprapubic incisions bilaterally.  The Kelly clamp was placed between the sling and the urethra and the plastic sheaths were removed bilaterally.  Excess sling was trimmed and the sling was noted to be in good position.  The excess sling material was trimmed at this time.  Excess vaginal mucosa was trimmed and Gelfoam was placed across the sling.  Hemostasis was good and the anterior vaginal wall was closed at this time with a running locked suture of 2-0 Vicryl.  The Foley catheter was removed and cystoscopy was performed again and at this time, efflux was noted from the right ureter, indicating patency of both ureters.  The bladder was drained of any cystoscopic fluid.  The Foley catheter was replaced and left to gravity drainage.  The suprapubic incisions were closed with a subcuticular suture of 4-0 Vicryl followed by Dermabond.  A sterile honeycomb dressing was placed over the Pfannenstiel incision at this time.  This concluded the patient's procedure.  There were no complications. All needle, instrument, and sponge counts were correct.  The patient is escorted to the recovery room in stable and awake condition.     Lenard Galloway, M.D.     BES/MEDQ  D:  07/28/2013  T:  07/29/2013  Job:  [id number]

## 2013-07-30 ENCOUNTER — Inpatient Hospital Stay (HOSPITAL_COMMUNITY): Payer: Medicare HMO

## 2013-07-30 DIAGNOSIS — I369 Nonrheumatic tricuspid valve disorder, unspecified: Secondary | ICD-10-CM

## 2013-07-30 LAB — CBC WITH DIFFERENTIAL/PLATELET
Basophils Absolute: 0 10*3/uL (ref 0.0–0.1)
Basophils Relative: 0 % (ref 0–1)
Eosinophils Absolute: 0.1 10*3/uL (ref 0.0–0.7)
Eosinophils Relative: 0 % (ref 0–5)
HCT: 29.4 % — ABNORMAL LOW (ref 36.0–46.0)
Hemoglobin: 9.6 g/dL — ABNORMAL LOW (ref 12.0–15.0)
Lymphocytes Relative: 8 % — ABNORMAL LOW (ref 12–46)
Lymphs Abs: 1.1 10*3/uL (ref 0.7–4.0)
MCH: 29.3 pg (ref 26.0–34.0)
MCHC: 32.7 g/dL (ref 30.0–36.0)
MCV: 89.6 fL (ref 78.0–100.0)
Monocytes Absolute: 0.7 10*3/uL (ref 0.1–1.0)
Monocytes Relative: 5 % (ref 3–12)
Neutro Abs: 12.7 10*3/uL — ABNORMAL HIGH (ref 1.7–7.7)
Neutrophils Relative %: 87 % — ABNORMAL HIGH (ref 43–77)
Platelets: 208 10*3/uL (ref 150–400)
RBC: 3.28 MIL/uL — ABNORMAL LOW (ref 3.87–5.11)
RDW: 14.2 % (ref 11.5–15.5)
WBC: 14.6 10*3/uL — ABNORMAL HIGH (ref 4.0–10.5)

## 2013-07-30 LAB — MAGNESIUM: Magnesium: 1.9 mg/dL (ref 1.5–2.5)

## 2013-07-30 LAB — BASIC METABOLIC PANEL
BUN: 8 mg/dL (ref 6–23)
CO2: 23 mEq/L (ref 19–32)
Calcium: 8.3 mg/dL — ABNORMAL LOW (ref 8.4–10.5)
Chloride: 99 mEq/L (ref 96–112)
Creatinine, Ser: 0.62 mg/dL (ref 0.50–1.10)
GFR calc Af Amer: >90 mL/min (ref >90)
GFR calc non Af Amer: 89 mL/min — ABNORMAL LOW (ref >90)
Glucose, Bld: 105 mg/dL — ABNORMAL HIGH (ref 70–99)
Potassium: 4.7 mEq/L (ref 3.7–5.3)
Sodium: 133 mEq/L — ABNORMAL LOW (ref 137–147)

## 2013-07-30 LAB — PHOSPHORUS: Phosphorus: 1.8 mg/dL — ABNORMAL LOW (ref 2.3–4.6)

## 2013-07-30 MED ORDER — SODIUM PHOSPHATE 3 MMOLE/ML IV SOLN
20.0000 mmol | Freq: Once | INTRAVENOUS | Status: AC
  Administered 2013-07-30: 20 mmol via INTRAVENOUS
  Filled 2013-07-30: qty 6.67

## 2013-07-30 MED ORDER — SALINE SPRAY 0.65 % NA SOLN
1.0000 | NASAL | Status: DC | PRN
  Administered 2013-07-30: 1 via NASAL
  Filled 2013-07-30: qty 44

## 2013-07-30 MED ORDER — ALBUTEROL SULFATE (2.5 MG/3ML) 0.083% IN NEBU: 3.0000 mL | INHALATION_SOLUTION | Freq: Four times a day (QID) | RESPIRATORY_TRACT | Status: DC | PRN

## 2013-07-30 MED ORDER — SODIUM CHLORIDE 0.9 % IV SOLN
INTRAVENOUS | Status: DC
  Administered 2013-07-30: 08:00:00 via INTRAVENOUS

## 2013-07-30 MED ORDER — TRAMADOL HCL 50 MG PO TABS: 50.0000 mg | ORAL_TABLET | Freq: Four times a day (QID) | ORAL | Status: DC | PRN

## 2013-07-30 NOTE — Progress Notes (Signed)
Patient ID: Danielle Lucas, female   DOB: 25-Aug-1940, 73 y.o.   MRN: [id number]  EP followup note   Patient Name: Danielle Lucas Date of Encounter: 07/30/2013     Active Problems:   Pelvic prolapse   Vomiting   Asystole   Hypoxemia   Acute respiratory failure    SUBJECTIVE  Feels better, denies abdominal pain or nausea on ibuprofen. Reverted back to nsr on flecainide.  CURRENT MEDS . ibuprofen  800 mg Oral TID  . pantoprazole  40 mg Oral Daily    OBJECTIVE  Filed Vitals:   07/30/13 0500 07/30/13 0600 07/30/13 0700 07/30/13 0735  BP: 122/52 115/56 118/54   Pulse: 71 75 67   Temp:    98.5 F (36.9 C)  TempSrc:    Oral  Resp: 17 17 14    Height:      Weight:      SpO2: 100% 100% 99%     Intake/Output Summary (Last 24 hours) at 07/30/13 0838 Last data filed at 07/30/13 0700  Gross per 24 hour  Intake 4313.33 ml  Output   1375 ml  Net 2938.33 ml   Filed Weights   07/28/13 2147 07/29/13 1145 07/29/13 1154  Weight: 131 lb (59.421 kg) 145 lb 1 oz (65.8 kg) 144 lb 10 oz (65.6 kg)    PHYSICAL EXAM  General: Pleasant, NAD. Neuro: Alert and oriented X 3. Moves all extremities spontaneously. Psych: Normal affect. HEENT:  Normal  Neck: Supple without bruits or JVD. Right IJ temp PM in place Lungs:  Resp regular and unlabored, CTA. Heart: RRR no s3, s4, or murmurs. Abdomen: Soft, fresh incision clean and dry Extremities: No clubbing, cyanosis or edema. DP/PT/Radials 2+ and equal bilaterally.  Accessory Clinical Findings  CBC  Recent Labs  07/29/13 0530 07/30/13 0510  WBC 14.9* 14.6*  NEUTROABS  --  12.7*  HGB 10.7* 9.6*  HCT 33.6* 29.4*  MCV 90.1 89.6  PLT 265 101   Basic Metabolic Panel  Recent Labs  07/29/13 0900 07/30/13 0510  NA 135* 133*  K 3.3* 4.7  CL 97 99  CO2 25 23  GLUCOSE 129* 105*  BUN 7 8  CREATININE 0.57 0.62  CALCIUM 7.8* 8.3*  MG 2.1 1.9  PHOS 1.8* 1.8*   Liver Function Tests  Recent Labs  07/29/13 0900  AST 50*   ALT 50*  ALKPHOS 65  BILITOT 0.5  PROT 5.5*  ALBUMIN 3.0*   No results found for this basename: LIPASE, AMYLASE,  in the last 72 hours Cardiac Enzymes  Recent Labs  07/29/13 0900  CKTOTAL 150  CKMB 2.2  TROPONINI <0.30   BNP No components found with this basename: POCBNP,  D-Dimer No results found for this basename: DDIMER,  in the last 72 hours Hemoglobin A1C No results found for this basename: HGBA1C,  in the last 72 hours Fasting Lipid Panel No results found for this basename: CHOL, HDL, LDLCALC, TRIG, CHOLHDL, LDLDIRECT,  in the last 72 hours Thyroid Function Tests No results found for this basename: TSH, T4TOTAL, FREET3, T3FREE, THYROIDAB,  in the last 72 hours  TELE  Atrial fib to NSR  Radiology/Studies  Dg Chest Port 1 View  07/30/2013   CLINICAL DATA:  Respiratory failure  EXAM: PORTABLE CHEST - 1 VIEW  COMPARISON:  Yesterday  FINDINGS: Moderate cardiomegaly. Bilateral central and basilar airspace disease. Right internal jugular temporary pacemaker device with its tip projecting over the right ventricle apex. No pneumothorax.  IMPRESSION: Bilateral airspace disease.  Right internal jugular temporary pacer with its tip at the right ventricle apex and no pneumothorax.   Electronically Signed   By: Maryclare Bean M.D.   On: 07/30/2013 07:46   Dg Chest Port 1 View  07/29/2013   CLINICAL DATA:  Atrial fibrillation  EXAM: PORTABLE CHEST - 1 VIEW  COMPARISON:  None.  FINDINGS: There is no edema or consolidation. Heart is upper normal in size with normal pulmonary vascularity. No adenopathy. No bone lesions.  IMPRESSION: No edema or consolidation.   Electronically Signed   By: Lowella Grip M.D.   On: 07/29/2013 09:28    ASSESSMENT AND PLAN 1. Vagal induced asystole after abdominal hysterectomy 2. Atrial fibrillation with an RVR 3. S/p temp pm Rec: She has reverted to NSR on a single dose of flecainide. No additional asystole. Ok to remove temp PM wire. Long term she will  need systemic anti-coagulation. Would like for Dr. Sabra Heck and team to let us know when she can start blood thinners. We would typically start eliquis or Xarelto with peak action of onset 4-6 hours after initiation.  Azlin Zilberman,M.D.  Arriel Victor,M.D.  07/30/2013 8:38 AM

## 2013-07-30 NOTE — Progress Notes (Signed)
PULMONARY / CRITICAL CARE MEDICINE   Name: Danielle Lucas MRN: [id number] DOB: [birth year]    ADMISSION DATE:  07/28/2013 CONSULTATION DATE:  07/28/2013  REFERRING MD :  Judeth Horn PRIMARY SERVICE: OB/GYN  CHIEF COMPLAINT:  AMS and A-fib with RVR.  BRIEF PATIENT DESCRIPTION: 73 year old female with PMH of asthma who presented to [facility] for an abdominal hysterectomy and pelvic prolapse repair.  Patient had her procedure done on 6/2 and was admitted to the floor on morphine PCA.  On 6/3 developed AMS, n/v with multiple brief episodes of unresponsiveness.  She received narcan with mild improvement.  EKG revealed AFib, cardiology consulted and during their assessment pt had episode of asystole for approx 30 seconds with unresposniveness.  Regained pulse without CPR or ACLS drugs.  She was tx to [facility] for emergent cath lab eval and PCCM consulted for assist.   SIGNIFICANT EVENTS / STUDIES:  6/2 TAH-BSO and pelvic prolapse repair. 6/3 emergent cath lab, temporary pacemaker 6/4- pacer dc'ed, remains in Sr since 10 pm   LINES / TUBES: PIV  CULTURES: None  ANTIBIOTICS: None   SUBJECTIVE:  No c/o.  No further brady.  NSR now.  Temp pacer out. OOB in chair.   VITAL SIGNS: Temp:  [97.7 F (36.5 C)-98.5 F (36.9 C)] 97.7 F (36.5 C) (06/04 1130) Pulse Rate:  [28-132] 78 (06/04 1000) Resp:  [14-24] 19 (06/04 1130) BP: (72-122)/(20-88) 104/49 mmHg (06/04 1130) SpO2:  [84 %-100 %] 94 % (06/04 1130)  HEMODYNAMICS:   VENTILATOR SETTINGS:   INTAKE / OUTPUT: Intake/Output     06/03 0701 - 06/04 0700 06/04 0701 - 06/05 0700   P.O. 860 75   I.V. (mL/kg) 2633.3 (40.1) 141.3 (2.2)   IV Piggyback 820    Total Intake(mL/kg) 4313.3 (65.8) 216.3 (3.3)   Urine (mL/kg/hr) 1375 (0.9) 700 (2.1)   Emesis/NG output 500 (0.3)    Blood     Total Output 1875 700   Net +2438.3 -483.7          PHYSICAL EXAMINATION: General:  Well appearing female, NAD OOB in chair. Neuro:  Awake, alert and  interactive, moves all ext to command. HEENT:  Barry/AT, PERRL, EOM-I and MMM. Cardiovascular:  RRR, Nl S1/S2, -M/R/G. Lungs:  CTA bilaterally. Abdomen:  Soft, mildly tender, ND and +BS, incision c/d Musculoskeletal:  -edema and -tenderness Skin:  Surgical site clear, intact.  LABS:  CBC  Recent Labs Lab 07/29/13 0530 07/30/13 0510  WBC 14.9* 14.6*  HGB 10.7* 9.6*  HCT 33.6* 29.4*  PLT 265 208   Coag's No results found for this basename: APTT, INR,  in the last 168 hours BMET  Recent Labs Lab 07/29/13 0530 07/29/13 0900 07/30/13 0510  NA 137 135* 133*  K 4.0 3.3* 4.7  CL 100 97 99  CO2 27 25 23   BUN 7 7 8   CREATININE 0.57 0.57 0.62  GLUCOSE 124* 129* 105*   Electrolytes  Recent Labs Lab 07/29/13 0530 07/29/13 0900 07/30/13 0510  CALCIUM 8.1* 7.8* 8.3*  MG  --  2.1 1.9  PHOS  --  1.8* 1.8*   Sepsis Markers No results found for this basename: LATICACIDVEN, PROCALCITON, O2SATVEN,  in the last 168 hours ABG No results found for this basename: PHART, PCO2ART, PO2ART,  in the last 168 hours Liver Enzymes  Recent Labs Lab 07/29/13 0900  AST 50*  ALT 50*  ALKPHOS 65  BILITOT 0.5  ALBUMIN 3.0*   Cardiac Enzymes  Recent Labs Lab 07/29/13  0900  TROPONINI <0.30   Glucose No results found for this basename: GLUCAP,  in the last 168 hours  Imaging Dg Chest Port 1 View  07/30/2013   CLINICAL DATA:  Respiratory failure  EXAM: PORTABLE CHEST - 1 VIEW  COMPARISON:  Yesterday  FINDINGS: Moderate cardiomegaly. Bilateral central and basilar airspace disease. Right internal jugular temporary pacemaker device with its tip projecting over the right ventricle apex. No pneumothorax.  IMPRESSION: Bilateral airspace disease.  Right internal jugular temporary pacer with its tip at the right ventricle apex and no pneumothorax.   Electronically Signed   By: Maryclare Bean M.D.   On: 07/30/2013 07:46   Dg Chest Port 1 View  07/29/2013   CLINICAL DATA:  Atrial fibrillation  EXAM:  PORTABLE CHEST - 1 VIEW  COMPARISON:  None.  FINDINGS: There is no edema or consolidation. Heart is upper normal in size with normal pulmonary vascularity. No adenopathy. No bone lesions.  IMPRESSION: No edema or consolidation.   Electronically Signed   By: Lowella Grip M.D.   On: 07/29/2013 09:28     ASSESSMENT / PLAN:  PULMONARY Concern for aspiration with vomiting - doubt, CXR clear, afebrile P:   pulm hygiene  Monitor closely for aspiration, if febrile or concern for aspiration would pan culture and start abx but doubt will need this Consider pcxr follow up for this IS  CARDIOVASCULAR Afib  Asystole - s/p temporary pacemaker.  ?r/t narcotics in setting vomiting.  P:  Cards following  Echo pending  Avoid narcotics given history telemetry  No anticoagulation for now with recent surgery but will likely need at some point  cardiology doubts will need perm pacer - it has been dc'ed temp  RENAL Hypokalemia - mild  Hyponatremia - mild  hypophos P:   Replete electrolytes as indicated. F/u chem  Replace phos  GASTROINTESTINAL N/V likely related narcotic use.   P:   Antiemetic PRN Avoid narcs as below   HEMATOLOGIC Anemia - mild  P:  F/u cbc   INFECTIOUS No s/s active infection  P:   Monitor wbc, fever curve off abx   ENDOCRINE No active issue  P:    GYN S/p TAH 6/3 P:  Per GYN    NEUROLOGIC AMS - in setting narcotics and asystole - resolved.  P:   Narcan gtt  Avoid narcotics   Pt much improved, temp pacer removed, now in NSR.  No evidence aspiration, looks great sitting OOB in chair.  OK to tx out of ICU from PCCM standpoint.   PCCM signing off, please call back if needed.    Nickolas Madrid, NP 07/30/2013  12:08 PM Pager: [phone] or [phone]  *Care during the described time interval was provided by me and/or other providers on the critical care team. I have reviewed this patient's available data, including medical history, events  of note, physical examination and test results as part of my evaluation.  Lavon Paganini. Titus Mould, MD, [place] Pgr: [facility]

## 2013-07-30 NOTE — Progress Notes (Signed)
07/30/2013 Pacer wire removed at 0930. No change in rhythm. Introducer removed and pressure held and bandage applied. Danielle Lucas Alegandra Sommers

## 2013-07-30 NOTE — Progress Notes (Signed)
2 Days Post-Op Procedure(s) (LRB): HYSTERECTOMY ABDOMINAL with SALPINGO OOPHORECTOMY (N/A) TRANSVAGINAL TAPE (TVT) PROCEDURE midurethral sling (N/A) ABDOMINO SACROCOLPOPEXY (N/A) CYSTOSCOPY (N/A)  Subjective: Patient reports tolerating PO, + flatus and no problems voiding. Ambulating.  Pacemaker removed after patient converted back to normal sinus rhythm following flecanide po.   Had ECHO today.  Feels a little short of breath, but it is improving.  Has asthma and rarely uses albuterol inhaler.  Using Motrin for pain.   Objective: I have reviewed patient's vital signs, intake and output and labs. Post void residuals under 100 cc.  Hgb 9.6, WBC 14.6, Ca 8.3, Na 133, Mg 1.9, Ph 1.8 this am.   General: alert Resp:  Mild rales in bases bilaterally.  No rhonchi.  Cardio: regular rate and rhythm, S1, S2 normal, no murmur, click, rub or gallop GI: soft, non-tender; bowel sounds normal; no masses,  no organomegaly Dressing with mild bloody drainage. Suprapubic sites with ecchymoses.  Assessment: s/p Procedure(s) with comments: HYSTERECTOMY ABDOMINAL with SALPINGO OOPHORECTOMY (N/A) - request 4.5 hours TRANSVAGINAL TAPE (TVT) PROCEDURE midurethral sling (N/A) ABDOMINO SACROCOLPOPEXY (N/A) CYSTOSCOPY (N/A): stable Status post successful cardioversion of atrial fibrillation with Flecanide  Progressing well surgically.  Asthma.  Mild shortness of breath.   Plan:  Plan for repeat CBC in am. Tramadol prn pain medication if needs stronger pain medication.  Albuterol inhaler.  Use incentive spirometer q 1 hour while awake.  Probable discharge tomorrow.   LOS: 2 days    [place]ankee Hill 07/30/2013, 7:57 PM

## 2013-07-30 NOTE — Progress Notes (Addendum)
1 Day Post-Op Procedure(s) (LRB): TEMPORARY PACEMAKER INSERTION (N/A)  Subjective: Patient reports she is feeling stiff and ready to get up and be moving.  Pain control is ok.  Got some tylenol early this morning and that helped.  Tolerated regular diet last night.  No nausea.    Objective: I have reviewed patient's vital signs, intake and output, medications and labs. K 4.7, Na 133, BUN/Cr nl.  WBC 14.6 with left shift,  Hb 9.6, plt 288 CXR FINDINGS: Moderate cardiomegaly. Bilateral central and basilar airspace  disease. Right internal jugular temporary pacemaker device with its  tip projecting over the right ventricle apex. No pneumothorax.   General: alert Resp: clear upper lobes, crackles at bases Cardio: regular rate and rhythm, S1, S2 normal, no murmur, click, rub or gallop GI: normal findings: soft, appropriately tender, mild distension and incision: clean, dry, intact and and dressing in place Extremities: extremities normal, atraumatic, no cyanosis or edema and SCDs in place Vaginal Bleeding: none  Assessment: s/p Procedure(s): TEMPORARY PACEMAKER INSERTION (N/A): progressing well S/P TAH/BSO, Abdominal sacralcolpopexy, TVT, cystoscopy  Plan: Encourage ambulation KVO IV fluids Hopeful removal of temporary pacemaker Once this is removed, will remove catheter and do bladder trials Encourage Incentive Spirometry   LOS: 2 days    Lyman Speller 07/30/2013, 8:07 AM

## 2013-07-30 NOTE — Progress Notes (Signed)
Echocardiogram 2D Echocardiogram has been performed.  [name] 07/30/2013, 2:28 PM

## 2013-07-31 LAB — CBC WITH DIFFERENTIAL/PLATELET
Basophils Absolute: 0.1 10*3/uL (ref 0.0–0.1)
Basophils Relative: 1 % (ref 0–1)
Eosinophils Absolute: 0.3 10*3/uL (ref 0.0–0.7)
Eosinophils Relative: 2 % (ref 0–5)
HCT: 31.3 % — ABNORMAL LOW (ref 36.0–46.0)
Hemoglobin: 10.2 g/dL — ABNORMAL LOW (ref 12.0–15.0)
Lymphocytes Relative: 7 % — ABNORMAL LOW (ref 12–46)
Lymphs Abs: 0.8 10*3/uL (ref 0.7–4.0)
MCH: 29.2 pg (ref 26.0–34.0)
MCHC: 32.6 g/dL (ref 30.0–36.0)
MCV: 89.7 fL (ref 78.0–100.0)
Monocytes Absolute: 0.6 10*3/uL (ref 0.1–1.0)
Monocytes Relative: 5 % (ref 3–12)
Neutro Abs: 9.6 10*3/uL — ABNORMAL HIGH (ref 1.7–7.7)
Neutrophils Relative %: 85 % — ABNORMAL HIGH (ref 43–77)
Platelets: 212 10*3/uL (ref 150–400)
RBC: 3.49 MIL/uL — ABNORMAL LOW (ref 3.87–5.11)
RDW: 14.1 % (ref 11.5–15.5)
WBC: 11.3 10*3/uL — ABNORMAL HIGH (ref 4.0–10.5)

## 2013-07-31 LAB — BASIC METABOLIC PANEL
BUN: 8 mg/dL (ref 6–23)
CO2: 27 mEq/L (ref 19–32)
Calcium: 8.3 mg/dL — ABNORMAL LOW (ref 8.4–10.5)
Chloride: 108 mEq/L (ref 96–112)
Creatinine, Ser: 0.63 mg/dL (ref 0.50–1.10)
GFR calc Af Amer: >90 mL/min (ref >90)
GFR calc non Af Amer: 88 mL/min — ABNORMAL LOW (ref >90)
Glucose, Bld: 106 mg/dL — ABNORMAL HIGH (ref 70–99)
Potassium: 3.7 mEq/L (ref 3.7–5.3)
Sodium: 144 mEq/L (ref 137–147)

## 2013-07-31 LAB — PHOSPHORUS: Phosphorus: 1.9 mg/dL — ABNORMAL LOW (ref 2.3–4.6)

## 2013-07-31 LAB — TYPE AND SCREEN
ABO/RH(D): A POS
Antibody Screen: NEGATIVE

## 2013-07-31 LAB — MAGNESIUM: Magnesium: 1.9 mg/dL (ref 1.5–2.5)

## 2013-07-31 MED ORDER — ACETAMINOPHEN 325 MG PO TABS: 650.0000 mg | ORAL_TABLET | ORAL | Status: DC | PRN

## 2013-07-31 MED ORDER — ALBUTEROL SULFATE (2.5 MG/3ML) 0.083% IN NEBU
3.0000 mL | INHALATION_SOLUTION | Freq: Four times a day (QID) | RESPIRATORY_TRACT | Status: DC | PRN
Start: 2013-07-31 — End: 2013-07-31

## 2013-07-31 MED ORDER — IBUPROFEN 800 MG PO TABS: 800.0000 mg | ORAL_TABLET | Freq: Three times a day (TID) | ORAL | Status: DC

## 2013-07-31 MED ORDER — TRAMADOL HCL 50 MG PO TABS: 50.0000 mg | ORAL_TABLET | Freq: Four times a day (QID) | ORAL | Status: DC | PRN

## 2013-07-31 NOTE — Progress Notes (Signed)
OK to discharge home per [employer] EP RN Cards follow up 1 Jul 15 AT 1150, Scott from office to follow up with pt by phone, per [employer].

## 2013-07-31 NOTE — Progress Notes (Signed)
3 Days Post-Op Procedure(s) (LRB): HYSTERECTOMY ABDOMINAL with SALPINGO OOPHORECTOMY (N/A) TRANSVAGINAL TAPE (TVT) PROCEDURE midurethral sling (N/A) ABDOMINO SACROCOLPOPEXY (N/A) CYSTOSCOPY (N/A)  Subjective: Patient reports tired.  Did not sleep last hs.  Did not use Albuterol inhaler.  ICU tried to give her a nebulizer and patient did not do this.  Ambulating.  Breathing OK today.  Voiding well.   Objective: I have reviewed patient's vital signs, intake and output and labs.  General: alert Resp: clear to auscultation bilaterally and  rales in left base.  No rhonchi. Cardio: regular rate and rhythm, S1, S2 normal, no murmur, click, rub or gallop GI: soft, non-tender; bowel sounds normal; no masses,  no organomegaly Extremities: Ted hose on .  Vaginal Bleeding: minimal Incision - mild blood staining of the steristrips.  Suprapubic area with ecchymoses and no induration.   CBC pending.  Ph 1.9 Mg 1.9 Glucose 106, Ca 8.3  ECHO - mild to moderate tricuspid regurgitation.     Assessment: s/p Procedure(s) with comments: HYSTERECTOMY ABDOMINAL with SALPINGO OOPHORECTOMY (N/A) - request 4.5 hours TRANSVAGINAL TAPE (TVT) PROCEDURE midurethral sling (N/A) ABDOMINO SACROCOLPOPEXY (N/A) CYSTOSCOPY (N/A): progressing well Hgb pending.  Atrial fibrillation resolved.  Left atelectasis.   Plan: Discharge home  Await CBC for final disposition.  Patient will follow up in office for GYN check in 3 days.  Instructions/precautions given.  Rx for Motrin and Tramadol.  Rx for Albuterol inhaler.   LOS: 3 days    [place] 07/31/2013, 7:22 AM

## 2013-07-31 NOTE — Progress Notes (Signed)
Patient ID: Danielle Lucas, female   DOB: [birth year], 73 y.o.   MRN: [id number]  EP followup note   Patient Name: Danielle Lucas Date of Encounter: 07/31/2013     Active Problems:   Pelvic prolapse   Vomiting   Asystole   Hypoxemia   Acute respiratory failure    SUBJECTIVE  Feels better, denies abdominal pain or nausea on ibuprofen. maintaining nsr off of flecainide.  CURRENT MEDS . ibuprofen  800 mg Oral TID  . pantoprazole  40 mg Oral Daily    OBJECTIVE  Filed Vitals:   07/31/13 0429 07/31/13 0700 07/31/13 0754 07/31/13 0800  BP:   141/70   Pulse:   78 75  Temp:    98.2 F (36.8 C)  TempSrc:    Oral  Resp:      Height:      Weight:      SpO2: 95% 99% 98% 98%    Intake/Output Summary (Last 24 hours) at 07/31/13 1602 Last data filed at 07/31/13 1000  Gross per 24 hour  Intake    960 ml  Output   4250 ml  Net  -3290 ml   Filed Weights   07/28/13 2147 07/29/13 1145 07/29/13 1154  Weight: 131 lb (59.421 kg) 145 lb 1 oz (65.8 kg) 144 lb 10 oz (65.6 kg)    PHYSICAL EXAM  General: Pleasant, NAD. Neuro: Alert and oriented X 3. Moves all extremities spontaneously. HEENT:  Normal  Neck: Supple without bruits or JVD. Right IJ temp PM in place Lungs:  Resp regular and unlabored, CTA. Heart: RRR no s3, s4, or murmurs. Abdomen: Soft, fresh incision clean and dry Extremities: No clubbing, cyanosis or edema. DP/PT/Radials 2+ and equal bilaterally.  Accessory Clinical Findings  CBC  Recent Labs  07/30/13 0510 07/31/13 0741  WBC 14.6* 11.3*  NEUTROABS 12.7* 9.6*  HGB 9.6* 10.2*  HCT 29.4* 31.3*  MCV 89.6 89.7  PLT 208 627   Basic Metabolic Panel  Recent Labs  07/30/13 0510 07/31/13 0440  NA 133* 144  K 4.7 3.7  CL 99 108  CO2 23 27  GLUCOSE 105* 106*  BUN 8 8  CREATININE 0.62 0.63  CALCIUM 8.3* 8.3*  MG 1.9 1.9  PHOS 1.8* 1.9*   Liver Function Tests  Recent Labs  07/29/13 0900  AST 50*  ALT 50*  ALKPHOS 65  BILITOT 0.5  PROT 5.5*   ALBUMIN 3.0*   No results found for this basename: LIPASE, AMYLASE,  in the last 72 hours Cardiac Enzymes  Recent Labs  07/29/13 0900  CKTOTAL 150  CKMB 2.2  TROPONINI <0.30   BNP No components found with this basename: POCBNP,  D-Dimer No results found for this basename: DDIMER,  in the last 72 hours Hemoglobin A1C No results found for this basename: HGBA1C,  in the last 72 hours Fasting Lipid Panel No results found for this basename: CHOL, HDL, LDLCALC, TRIG, CHOLHDL, LDLDIRECT,  in the last 72 hours Thyroid Function Tests No results found for this basename: TSH, T4TOTAL, FREET3, T3FREE, THYROIDAB,  in the last 72 hours  TELE   NSR  Radiology/Studies  Dg Chest Port 1 View  07/30/2013   CLINICAL DATA:  Respiratory failure  EXAM: PORTABLE CHEST - 1 VIEW  COMPARISON:  Yesterday  FINDINGS: Moderate cardiomegaly. Bilateral central and basilar airspace disease. Right internal jugular temporary pacemaker device with its tip projecting over the right ventricle apex. No pneumothorax.  IMPRESSION: Bilateral airspace disease.  Right internal jugular temporary  pacer with its tip at the right ventricle apex and no pneumothorax.   Electronically Signed   By: Maryclare Bean M.D.   On: 07/30/2013 07:46   Dg Chest Port 1 View  07/29/2013   CLINICAL DATA:  Atrial fibrillation  EXAM: PORTABLE CHEST - 1 VIEW  COMPARISON:  None.  FINDINGS: There is no edema or consolidation. Heart is upper normal in size with normal pulmonary vascularity. No adenopathy. No bone lesions.  IMPRESSION: No edema or consolidation.   Electronically Signed   By: Lowella Grip M.D.   On: 07/29/2013 09:28    ASSESSMENT AND PLAN 1. Vagal induced asystole after abdominal hysterectomy 2. Atrial fibrillation with an RVR, now back to nsr 3. S/p temp pm, removed without recurrent bradycardia Rec: She appears stable for discharge. She wil  Shahram Alexopoulos,M.D.  07/31/2013 4:02 PM

## 2013-07-31 NOTE — Discharge Instructions (Signed)
Hysterectomy °Care After °Refer to this sheet in the next few weeks. These instructions provide you with information on caring for yourself after your procedure. Your caregiver may also give you more specific instructions. Your treatment has been planned according to current medical practices, but problems sometimes occur. Call your caregiver if you have any problems or questions after your procedure. °HOME CARE INSTRUCTIONS  °Healing will take time. You may have discomfort, tenderness, swelling, and bruising at the surgical site for about 2 weeks. This is normal and will get better as time goes on. °· Only take over-the-counter or prescription medicines for pain, discomfort, or fever as directed by your caregiver. °· Do not take aspirin. It can cause bleeding. °· Do not drive when taking pain medicine. °· Follow your caregiver's advice regarding exercise, lifting, driving, and general activities. °· Resume your usual diet as directed and allowed. °· Get plenty of rest and sleep. °· Do not douche, use tampons, or have sexual intercourse for at least 6 weeks or until your caregiver gives you permission. °· Change your bandages (dressings) as directed by your caregiver. °· Monitor your temperature. °· Take showers instead of baths for 2 to 3 weeks. °· Do not drink alcohol until your caregiver gives you permission. °· If you are constipated, you may take a mild laxative with your caregiver's permission. Bran foods may help with constipation problems. Drinking enough fluids to keep your urine clear or pale yellow may help as well. °· Try to have someone home with you for 1 or 2 weeks to help around the house. °· Keep all of your follow-up appointments as directed by your caregiver. °SEEK MEDICAL CARE IF:  °· You have swelling, redness, or increasing pain in the surgical cut (incision) area. °· You have pus coming from the incision. °· You notice a bad smell coming from the incision or dressing. °· You have swelling,  redness, or pain around the intravenous (IV) site. °· Your incision breaks open. °· You feel dizzy or lightheaded. °· You have pain or bleeding when you urinate. °· You have persistent diarrhea. °· You have persistent nausea and vomiting. °· You have abnormal vaginal discharge. °· You have a rash. °· You have any type of abnormal reaction or develop an allergy to your medicine. °· Your pain is not controlled with your prescribed medicine. °SEEK IMMEDIATE MEDICAL CARE IF:  °· You have a fever. °· You have severe abdominal pain. °· You have chest pain. °· You have shortness of breath. °· You faint. °· You have pain, swelling, or redness of your leg. °· You have heavy vaginal bleeding with blood clots. °MAKE SURE YOU: °· Understand these instructions. °· Will watch your condition. °· Will get help right away if you are not doing well or get worse. °Document Released: 09/01/2004 Document Revised: 05/07/2011 Document Reviewed: 09/29/2010 °ExitCare® Patient Information ©2014 [employer]. ° °

## 2013-07-31 NOTE — H&P (Signed)
Update Note  Hgb 10.2, WBC 11.3, neutrophils 85%.  Hemoglobin stable and improved.  OK for discharge pending cardiology evaluation.  OK to start Xarelto as per cardiology.  Patient has follow up with GYN in 3 days.

## 2013-07-31 NOTE — Progress Notes (Signed)
Pt taken to discharge lounge while waiting for ride

## 2013-08-03 ENCOUNTER — Encounter: Payer: Self-pay | Admitting: Obstetrics and Gynecology

## 2013-08-03 ENCOUNTER — Ambulatory Visit (INDEPENDENT_AMBULATORY_CARE_PROVIDER_SITE_OTHER): Payer: Medicare HMO | Admitting: Obstetrics and Gynecology

## 2013-08-03 VITALS — BP 140/70 | HR 64 | Ht 64.25 in | Wt 140.0 lb

## 2013-08-03 DIAGNOSIS — Z9889 Other specified postprocedural states: Secondary | ICD-10-CM

## 2013-08-03 NOTE — Progress Notes (Signed)
Patient ID: Danielle Lucas, female   DOB: 1940-11-20, 73 y.o.   MRN: [id number] GYNECOLOGY  VISIT   HPI: 73 y.o.   Divorced  Caucasian  female   [phone] with Patient's last menstrual period was 02/27/2000.   here for  1 week post op.  Patient is status post TAH/BSO/abdominal sacrocolpopexy/TVT and cystoscopy on 07/28/13.  Patient developed syncopal episodes on post op day number one in the am and had an EKG showing atrial fibrillation.  When she reached the AICU at [facility] and was on telemetry, she had a episode of loss of consciousness and asytole.  She was transferred directly to the [facility] Cardiac Cath lab where she had a temporary pacemaker placed.  She later converted to sinus rhythm following a dose of oral Flecainide.  She will be following up with Cardiology in July for discussion of initiation of Xarelto.  The patient's mother had atrial fibrillation and suffered a stroke and expired two months later.    Patient states overall she is tired but fine. Not much appetite. Bowel function good. Voiding well.  Good pain control with Motrin alone. Some sinus headaches.  No shortness or breath, chest pain, or dizziness.   GYNECOLOGIC HISTORY: Patient's last menstrual period was 02/27/2000. Contraception:  Tubal ligation/TAH/BSO  Menopausal hormone therapy: Premarin vaginal cream        OB History   Grav Para Term Preterm Abortions TAB SAB Ect Mult Living   10 4 2 2 6 2 2 2 2 2          Patient Active Problem List   Diagnosis Date Noted  . Vomiting 07/29/2013  . Asystole 07/29/2013  . Hypoxemia 07/29/2013  . Acute respiratory failure 07/29/2013  . Pelvic prolapse 07/28/2013  . Cystocele 01/16/2013  . Uterine prolapse 01/16/2013    Past Medical History  Diagnosis Date  . Depression   . PONV (postoperative nausea and vomiting)     pt. thinks it was from dilaudid  . Hx of colonic polyps   . Asthma     as a child  . Osteopenia   . Atrial fibrillation   . Asystole    . Acute respiratory failure with hypoxia     Past Surgical History  Procedure Laterality Date  . Appendectomy    . Tubal ligation    . Cholecystectomy    . Left wrist Left     left wrist, plate inserted  . Abdominal hysterectomy N/A 07/28/2013    Procedure: HYSTERECTOMY ABDOMINAL with SALPINGO OOPHORECTOMY;  Surgeon: Lyman Speller, MD;  Location: [facility];  Service: Gynecology;  Laterality: N/A;  request 4.5 hours  . Bladder suspension N/A 07/28/2013    Procedure: TRANSVAGINAL TAPE (TVT) PROCEDURE midurethral sling;  Surgeon: Everardo All Amundson de Berton Lan, MD;  Location: [facility];  Service: Gynecology;  Laterality: N/A;  . Abdominal sacrocolpopexy N/A 07/28/2013    Procedure: ABDOMINO SACROCOLPOPEXY;  Surgeon: Jamey Reas de Berton Lan, MD;  Location: [facility];  Service: Gynecology;  Laterality: N/A;  . Cystoscopy N/A 07/28/2013    Procedure: CYSTOSCOPY;  Surgeon: Jamey Reas de Berton Lan, MD;  Location: [facility];  Service: Gynecology;  Laterality: N/A;    Current Outpatient Prescriptions  Medication Sig Dispense Refill  . acetaminophen (TYLENOL) 325 MG tablet Take 2 tablets (650 mg total) by mouth every 4 (four) hours as needed for mild pain.  30 tablet  0  . Biotin 1 MG CAPS Take 1 mg by mouth  every other day.      . Calcium-Vitamin D-Vitamin K (VIACTIV) [id number] MG-UNT-MCG CHEW Chew by mouth 2 (two) times daily.      . Cholecalciferol (VITAMIN D) 2000 UNITS CAPS Take 1 capsule by mouth daily.       . Cyanocobalamin (VITAMIN B-12 PO) Take by mouth daily.      Marland Kitchen glucosamine-chondroitin 500-400 MG tablet Take 1 tablet by mouth daily.      Marland Kitchen ibuprofen (ADVIL,MOTRIN) 800 MG tablet Take 1 tablet (800 mg total) by mouth 3 (three) times daily.  30 tablet  0  . NON FORMULARY Allergy injections once weekly      . oxyCODONE-acetaminophen (PERCOCET/ROXICET) 5-325 MG per tablet Take 1 tablet by mouth every 8 (eight) hours.      . pravastatin (PRAVACHOL) 20 MG tablet Take  20 mg by mouth daily.      . Zoledronic Acid (RECLAST IV) Inject into the vein.      Marland Kitchen escitalopram (LEXAPRO) 10 MG tablet Take 5 mg by mouth daily.       Marland Kitchen PREMARIN vaginal cream Place 1 Applicatorful vaginally 2 (two) times a week.      . traMADol (ULTRAM) 50 MG tablet Take 1 tablet (50 mg total) by mouth every 6 (six) hours as needed.  30 tablet  0   No current facility-administered medications for this visit.     ALLERGIES: Codeine and Dilaudid  Family History  Problem Relation Age of Onset  . Varicose Veins Mother   . Heart attack Father   . Cancer Brother     colon cancer (polyps)    History   Social History  . Marital Status: Divorced    Spouse Name: N/A    Number of Children: N/A  . Years of Education: N/A   Occupational History  . Not on file.   Social History Main Topics  . Smoking status: Former Smoker -- 1.00 packs/day for 30 years    Types: Cigarettes    Quit date: 07/02/1988  . Smokeless tobacco: Never Used  . Alcohol Use: 0.5 oz/week    1 drink(s) per week     Comment: Socially   . Drug Use: No  . Sexual Activity: No     Comment: TAH/BSO   Other Topics Concern  . Not on file   Social History Narrative  . No narrative on file    ROS:  Pertinent items are noted in HPI.  PHYSICAL EXAMINATION:    BP 140/70  Pulse 64  Ht 5' 4.25" (1.632 m)  Wt 140 lb (63.504 kg)  BMI 23.84 kg/m2  LMP 02/27/2000     General appearance: alert, cooperative and appears stated age Lungs: clear to auscultation bilaterally Heart: regular rate and rhythm Abdomen: soft, non-tender; no masses,  no organomegaly.  Pfannenstiel incision intact with steristrips on. Suprapubic incisions with ecchymoses.   No abnormal inguinal nodes palpated  ASSESSMENT  Status post TAH/BSO/Abdominal sacrocolpopexy/TVT Exact midurethral sling/cystoscopy. Doing well from surgical standpoint.  Atrial fibrillation with asystole resolved with temporary pacemaker and then oral Flecainide.   Family history of atrial fibrillation and stroke in the patient's mother.    PLAN  Motrin for pain.  Continue decreased activity.  Follow up in 4 weeks. OK to start Tomi Likens from a surgical standpoint.     An After Visit Summary was printed and given to the patient.

## 2013-08-07 NOTE — Discharge Summary (Signed)
Physician Discharge Summary  Patient ID: Danielle Lucas MRN: [id number] DOB/AGE: 1940/05/19 73 y.o.  Admit date: 07/28/2013 Discharge date: 07/31/13  Admission Diagnoses: 1.  Incomplete uterovaginal prolapse 2.  Genuine stress incontinence  Discharge Diagnoses:  1.  Incomplete uterovaginal prolapse 2.  Genuine stress incontinence 3.  Status post total abdominal hysterectomy with bilateral salpingo-oophorectomy, abdominal sacrocolpopexy, TVT Exact midurethral sling, cystoscopy 07/28/13 4.  Postoperatve nausea and vomiting.  5.  Postoperative atrial fibrillation with asystole  6.  Status post temporary pacemaker insertion 07/29/13 7.  Status post successful cardioversion with oral Flecainide 8.  Mild to moderate tricuspid regurgitation 9.  Hypophosphatemia  10.  Postoperative anemia 11. Hypokalemia   Active Problems:   Pelvic prolapse   Vomiting   Asystole   Hypoxemia   Acute respiratory failure   Discharged Condition: stable  Hospital Course: The patient was admitted on 07/29/13 for a total abdominal hysterectomy with bilateral salpingo-oophorectomy, abdominal sacrocolpopexy, TVT Exact midurethral sling, cystoscopy 07/28/13 which were performed under the direction of Drs. Lyman Speller and [facility] without complication while under general anesthesia.    The patient's immediate post op course was notable for postoperative pain which was treated with a morphine PCA.    On the morning of post op day number one, the patient developed brief period of loss of consciousness witnessed while she was in her hospital bed, and this was followed by profuse vomiting.  Code team called, but patient became alert quickly and spontaneously. Symptoms attributed to the morphine PCA, which was just discontinued due to IV infiltration. The patient received Narcan IM.  IV restarted.    After second event occurred, EKG performed showing atrial fibrillation with completing junctional pacemaker.  Plans  made for transfer to [facility] and Critical Care and Cardiology consultations requested.  Just after arriving in AICU, third event occurred with the same presentation.  Patient remained alert and oriented in between periods of loss of consciousness.  She was not yet on the telemetry as she just was moved into her AICU bed.  While awaiting consultation from the teams, labs drawn which ultimately showed a negative troponin I and CK MB and a low phosphorus 1.8 and calcium level 8.1. Portable CXR showed no infiltrate and demonstrated a slightly enlarged heart.   Cardiology arrived at bedside and performed consultation and attributed the patient's presentation initially due to respiratory depression from the PCA morphine.  Further recommendations made to proceed with an echocardiogram and possible a CT of the brain.  While cardiology still present in the AICU, patient developed fourth episode of loss of consciousness, this time while on telemetry, and asystole was confirmed.  Code called.  Patient did not require CPR as she spontaneously rapidly converted out of asystole and became conscious.  Cardiology made plans for immediate Care Link to the Cardiology Catheterization lab for a temporary pacemaker.   Patient underwent the temporary pacemaker placement and was transferred to her Cardiac AICU bed where she later that evening received oral Flecainide 150 mg and converted back into sinus rhythm.  She had no further episodes of atrial fibrillation or asystole.  Her temporary pacemaker was therefore removed on 07/30/13.    A 2D echo was preformed on 07/30/13 and showed normal biventricular size and function.  There was mild to moderate triscuspic regurgitation.  She received replacement of Phosphorus IV.  Her foley catheter was removed on post op day two, and she voided good volumes with post void residuals well under 100 cc.  Her voiding trials therefore ceased.    The patient tolerated a regular diet and took Motrin for  control of her pain.  She ambulated well and wore PAS and Ted hose for DVT prophylaxis while in bed.    Her incisions remained clean, dry, and intact.  She had very minimal vaginal bleeding. Her discharge hemoglobin was 10.2 on 07/31/13, which was improved from 9.6 on 07/30/13.  Based on the patient's cardiac events during the postop course and her family history of atrial fibrillation and stroke in her mother's history, the patient will be started on Xarelto by cardiology as an outpatient.   She is found to be in stable condition and ready for discharge on post op day three.  Consults: cardiology and pulmonary/intensive care  Significant Diagnostic Studies: labs:  Hgb 9.6 on 6/4 (10.2 on 6/5), calcium 8.1 on 6/3 (8.3 on 6/5), K 3.3 on 6/3 (3.7 on 6/5), phosphorus 1.8 on 6/3 (1.9 on 6/5), troponin I <0.30 on 6/3, total CK 150 with CK, MB 2.2 on 6/3, cardiac graphics: Telemetry:  Atrial fibrillation with asystole - converted to normal sinu rhythm, ECG:  Atrial fibrillation. and Echocardiogram:  Mild to moderate tricuspid regurgitation. and CXR portable on 07/29/13 showing no consolidation or edema, heart upper normal in size (bilateral central and basilar airspace disease, moderate cardiomegaly, right internal jugular temporary pacemaker projecting over the right ventricle apex with no pneumothorax on 6/4),  Treatments: cardiac meds:  Flecainide oral dose for successful cardioversion of atrial fibrillation, procedures:  Temporary pacemaker, and surgery: Status post total abdominal hysterectomy with bilateral salpingo-oophorectomy, abdominal sacrocolpopexy, TVT Exact midurethral sling, cystoscopy 07/28/13.  Discharge Exam: Blood pressure 141/70, pulse 75, temperature 98.2 F (36.8 C), temperature source Oral, resp. rate 14, height 5\' 3"  (1.6 m), weight 144 lb 10 oz (65.6 kg), last menstrual period 02/27/2000, SpO2 98.00%. General: alert  Resp: clear to auscultation bilaterally and rales in left base. No  rhonchi.  Cardio: regular rate and rhythm, S1, S2 normal, no murmur, click, rub or gallop  GI: soft, non-tender; bowel sounds normal; no masses, no organomegaly  Extremities: Ted hose on .  Vaginal Bleeding: minimal  Incision - mild blood staining of the steristrips.  Suprapubic area with ecchymoses and no induration.   Disposition: 01-Home or Self Care     Medication List    STOP taking these medications       aspirin EC 81 MG tablet     conjugated estrogens vaginal cream  Commonly known as:  PREMARIN      TAKE these medications       acetaminophen 325 MG tablet  Commonly known as:  TYLENOL  Take 2 tablets (650 mg total) by mouth every 4 (four) hours as needed for mild pain.     Biotin 1 MG Caps  Take 1 mg by mouth every other day.     escitalopram 10 MG tablet  Commonly known as:  LEXAPRO  Take 5 mg by mouth daily.     glucosamine-chondroitin 500-400 MG tablet  Take 1 tablet by mouth daily.     ibuprofen 800 MG tablet  Commonly known as:  ADVIL,MOTRIN  Take 1 tablet (800 mg total) by mouth 3 (three) times daily.     NON FORMULARY  Allergy injections once weekly     pravastatin 20 MG tablet  Commonly known as:  PRAVACHOL  Take 20 mg by mouth daily.     RECLAST IV  Inject into the vein.  traMADol 50 MG tablet  Commonly known as:  ULTRAM  Take 1 tablet (50 mg total) by mouth every 6 (six) hours as needed.     VIACTIV [phone] MG-UNT-MCG Chew  Generic drug:  Calcium-Vitamin D-Vitamin K  Chew by mouth 2 (two) times daily.     VITAMIN B-12 PO  Take by mouth daily.     Vitamin D 2000 UNITS Caps  Take 1 capsule by mouth daily.           Follow-up Information   Follow up with Amundson de Berton Lan, MD In 3 days.   Specialty:  Obstetrics and Gynecology   Contact information:   [address] [place] [address] [phone]       Follow up with Amundson de Berton Lan, MD In 3 days.   Specialty:  Obstetrics and  Gynecology   Contact information:   [address] [place] [address] [phone]      Follow up with Cardiology as recommended by that team.   Instructions and precautions given.   Signed: Tacy Learn 08/07/2013, 4:36 AM

## 2013-08-12 ENCOUNTER — Telehealth: Payer: Self-pay | Admitting: *Deleted

## 2013-08-12 ENCOUNTER — Ambulatory Visit: Payer: Medicare HMO | Admitting: Obstetrics and Gynecology

## 2013-08-12 NOTE — Telephone Encounter (Signed)
Call to patient and notified of benign path results.  Dr Quincy Simmonds had already instructed me that patient would need post op with her at 6 weeks and that you would want to see her as well. She suggested i schedule the patient for a visit between the one and six week appointments. I reviewed this with patient ad scheduled a post op for 08-14-13 at 4 with Dr Sabra Heck and the 09-14-13 appt will remain with dr Quincy Simmonds.  Will this work for you? This will be 4 weeks post surgery.

## 2013-08-12 NOTE — Telephone Encounter (Signed)
Message copied by Jaymes Graff on Wed Aug 12, 2013  5:31 PM ------      Message from: Megan Salon      Created: Sun Aug 09, 2013 10:40 PM       Can you call pt and inform pathology was negative.  She did have an endometrial polyp and fibroids but everything was benign.  She has a six week f/u with Dr. Quincy Simmonds.  Usually we have her see one of [place] at week 1, then the other at week 4-6 and then Dr. Quincy Simmonds at 3 months.  Can you change the 6 week f/u to me and make a 3 month f/u with Dr. Quincy Simmonds?  Thanks. ------

## 2013-08-13 NOTE — Telephone Encounter (Signed)
That's fine.  Thanks.  Encounter closed.

## 2013-08-24 ENCOUNTER — Encounter: Payer: Self-pay | Admitting: Obstetrics & Gynecology

## 2013-08-24 ENCOUNTER — Ambulatory Visit (INDEPENDENT_AMBULATORY_CARE_PROVIDER_SITE_OTHER): Payer: Medicare HMO | Admitting: Obstetrics & Gynecology

## 2013-08-24 VITALS — BP 124/82 | HR 64 | Resp 12 | Wt 139.4 lb

## 2013-08-24 DIAGNOSIS — Z9889 Other specified postprocedural states: Secondary | ICD-10-CM

## 2013-08-24 DIAGNOSIS — Z8679 Personal history of other diseases of the circulatory system: Secondary | ICD-10-CM

## 2013-08-24 NOTE — Progress Notes (Signed)
Post Operative Visit  Procedure: hysterectomy with salpingo oophorectomy, abdomino sacrocolpopexy cystoscopy Days Post-op: 28  Subjective: Doing well.  No vaginal bleeding.  Still having some yellowish discharge.  No pain.  Off all pain medications for over a week except on Saturday after shopping for 3+ hours.  She took motrin.  Energy isn't as good as it was before surgery.  Wants to know when can start exercising.  Would like to start walking at the [facility].  Also wants to know if she will get a "flatter" stomach back.  Seeing cardiologist on Wed.  Really doesn't want to start [place].  D/W pt pros/cons.  Advised her to ask every question she is asking me--can she wear a monitor first to see if she really goes into afib again, if her mother died at 66 due to a stroke why does she have to start now, how likely are the list of side effects she has read.  Objective: BP 124/82  Pulse 64  Resp 12  Wt 139 lb 6.4 oz (63.231 kg)  LMP 02/27/2000  EXAM General: alert and cooperative GI: soft, non-tender; bowel sounds normal; no masses,  no organomegaly and incision: clean and dry Extremities: extremities normal, atraumatic, no cyanosis or edema Vaginal Bleeding: none Gyn:  NAEFG, suture lines intact and healing well, sutures dissolving, cuff intact, no masses, discharge is appropriate for where she is post-operatively  Assessment: s/p TAH/BSO abdominal sacral colpopexy, TVT, cystoscopy  Plan: Patient has f/u 1 month with Dr. Quincy Simmonds Appropriate cardio discussed.  Pt aware no lifting greater than 5 pounds in each hand/arm Cardiology appt on 7/1

## 2013-08-26 ENCOUNTER — Ambulatory Visit (INDEPENDENT_AMBULATORY_CARE_PROVIDER_SITE_OTHER): Payer: Medicare HMO | Admitting: Physician Assistant

## 2013-08-26 ENCOUNTER — Encounter: Payer: Self-pay | Admitting: Physician Assistant

## 2013-08-26 VITALS — BP 120/81 | HR 66 | Ht 63.0 in | Wt 140.0 lb

## 2013-08-26 DIAGNOSIS — I480 Paroxysmal atrial fibrillation: Secondary | ICD-10-CM | POA: Insufficient documentation

## 2013-08-26 DIAGNOSIS — I4891 Unspecified atrial fibrillation: Secondary | ICD-10-CM

## 2013-08-26 DIAGNOSIS — I469 Cardiac arrest, cause unspecified: Secondary | ICD-10-CM

## 2013-08-26 NOTE — Progress Notes (Signed)
Cardiology Office Note    Date:  08/26/2013   ID:  Danielle Lucas, DOB 1940-05-09, MRN [id number]  PCP:  Jerlyn Ly, MD  Cardiologist:  Dr. Loralie Champagne   Electrophysiologist:  Dr. Cristopher Peru    History of Present Illness: Danielle Lucas is a 73 y.o. female with no prior history of cardiac disease who was admitted 6/2-6/5 for TAH/BSO with pelvic prolapse repair. She developed CHB/asystole postoperatively in the setting of severe nausea, vomiting and narcotic use. She developed atrial fibrillation with slow V rates. Temporary pacemaker was placed. She was also followed by electrophysiology. It was felt that she would eventually need long-term anticoagulation. This was not started immediately due to her postoperative state.  Asystole was felt to be vagally induced. She was given a single dose of Flecainide and reverted to NSR.  Temp pacer was removed.  Since d/c, she is doing well.  The patient denies chest pain, shortness of breath, syncope, orthopnea, PND or significant pedal edema.    Studies:  - Echo (07/30/13):  Vigorous LV function, EF 65-70%, normal wall motion, grade 1 diastolic dysfunction, normal RV function, mild to moderate TR, PASP 38 mm Hg   Recent Labs: 07/29/2013: ALT 50*  07/31/2013: Creatinine 0.63; Hemoglobin 10.2*; Potassium 3.7   Wt Readings from Last 3 Encounters:  08/26/13 140 lb (63.504 kg)  08/24/13 139 lb 6.4 oz (63.231 kg)  08/03/13 140 lb (63.504 kg)     Past Medical History  Diagnosis Date  . Depression   . PONV (postoperative nausea and vomiting)     pt. thinks it was from dilaudid  . Hx of colonic polyps   . Asthma     as a child  . Osteopenia   . Atrial fibrillation   . Asystole   . Acute respiratory failure with hypoxia     Current Outpatient Prescriptions  Medication Sig Dispense Refill  . Azelastine HCl 0.15 % SOLN Place 1 spray into both nostrils as needed.       . B Complex Vitamins (VITAMIN-B COMPLEX PO) Take by mouth daily.       . Biotin 1 MG CAPS Take 1 mg by mouth every other day.      . Calcium-Vitamin D-Vitamin K (VIACTIV) 160-737-10 MG-UNT-MCG CHEW Chew by mouth 2 (two) times daily.      . Cholecalciferol (VITAMIN D) 2000 UNITS CAPS Take 1 capsule by mouth daily.       Marland Kitchen EPIPEN 2-PAK 0.3 MG/0.3ML SOAJ injection       . glucosamine-chondroitin 500-400 MG tablet Take 1 tablet by mouth daily.      Marland Kitchen ibuprofen (ADVIL,MOTRIN) 800 MG tablet Take 1 tablet (800 mg total) by mouth 3 (three) times daily.  30 tablet  0  . NON FORMULARY Allergy injections once weekly      . pravastatin (PRAVACHOL) 20 MG tablet Take 20 mg by mouth daily.      Marland Kitchen PREMARIN vaginal cream Place 1 Applicatorful vaginally 2 (two) times a week.      . VENTOLIN HFA 108 (90 BASE) MCG/ACT inhaler as needed.      . Zoledronic Acid (RECLAST IV) Inject into the vein.       No current facility-administered medications for this visit.    Allergies:   Codeine and Dilaudid   Social History:  The patient  reports that she quit smoking about 25 years ago. Her smoking use included Cigarettes. She has a 30 pack-year smoking history. She has never used smokeless  tobacco. She reports that she drinks about .5 ounces of alcohol per week. She reports that she does not use illicit drugs.   Family History:  The patient's family history includes Cancer in her brother; Heart attack in her father; Varicose Veins in her mother.   ROS:  Please see the history of present illness.   No bleeding problems.   All other systems reviewed and negative.   PHYSICAL EXAM: VS:  BP 120/81  Pulse 66  Ht 5\' 3"  (1.6 m)  Wt 140 lb (63.504 kg)  BMI 24.81 kg/m2  LMP 02/27/2000 Well nourished, well developed, in no acute distress HEENT: normal Neck: no JVD Cardiac:  normal S1, S2; RRR; no murmur Lungs:  clear to auscultation bilaterally, no wheezing, rhonchi or rales Abd: soft, nontender, no hepatomegaly Ext: no edema Skin: warm and dry Neuro:  CNs 2-12 intact, no focal  abnormalities noted  EKG:  NSR, HR 66    ASSESSMENT AND PLAN:  1. Paroxysmal atrial fibrillation:  CHADS2-VASc=2 (age > 70 and female gender).  Annual stroke risk 2.9%.  CVA risk reduction would be 66% with anticoagulation.  Dr. Lovena Le felt that she should consider anticoagulation given her FHx of AFib and CVA (mother).  She is very hesitant to start anticoagulation at this time.  I spent > 30 minutes discussing AFib with the patient today and the indications for anticoagulation, including risks and benefits.  She prefers to wear an event monitor to assess for recurrent AFib before committing to anticoagulation.   I have encouraged her to research coumadin and the other NOACs (which we discussed today) as well.  I will have her follow up with Dr. Cristopher Peru after her monitor is completed.  2. Vagally Mediated Asystole:  This occurred after TAH/BSO in the setting of n/v and narcotic pain medication use. 3. Disposition:  F/u with Dr. Cristopher Peru in 4-6 weeks.    Signed, Versie Starks, MHS 08/26/2013 12:06 PM    [facility] [address], [place], [place]  [address] Phone: [phone]; Fax: [phone]

## 2013-08-26 NOTE — Patient Instructions (Signed)
NO CHANGES WERE MADE WITH MEDICATIONS TODAY  Your physician has recommended that you wear an event monitor. Event monitors are medical devices that record the heart's electrical activity. Doctors most often [place] these monitors to diagnose arrhythmias. Arrhythmias are problems with the speed or rhythm of the heartbeat. The monitor is a small, portable device. You can wear one while you do your normal daily activities. This is usually used to diagnose what is causing palpitations/syncope (passing out).  Your physician recommends that you schedule a follow-up appointment in: 4-6 [place]; ONCE YOUR MONITOR IS COMPLETE

## 2013-08-27 ENCOUNTER — Encounter: Payer: Self-pay | Admitting: *Deleted

## 2013-08-27 ENCOUNTER — Encounter (INDEPENDENT_AMBULATORY_CARE_PROVIDER_SITE_OTHER): Payer: Medicare HMO

## 2013-08-27 DIAGNOSIS — I480 Paroxysmal atrial fibrillation: Secondary | ICD-10-CM

## 2013-08-27 DIAGNOSIS — I4891 Unspecified atrial fibrillation: Secondary | ICD-10-CM

## 2013-08-27 NOTE — Progress Notes (Signed)
Patient ID: Danielle Lucas, female   DOB: 20-May-1940, 73 y.o.   MRN: [id number] [employer] 30 day cardiac event monitor applied to patient.

## 2013-09-09 ENCOUNTER — Ambulatory Visit: Payer: Medicare HMO | Admitting: Obstetrics and Gynecology

## 2013-09-14 ENCOUNTER — Ambulatory Visit (INDEPENDENT_AMBULATORY_CARE_PROVIDER_SITE_OTHER): Payer: Medicare HMO | Admitting: Obstetrics and Gynecology

## 2013-09-14 ENCOUNTER — Encounter: Payer: Self-pay | Admitting: Obstetrics and Gynecology

## 2013-09-14 VITALS — BP 122/78 | HR 78 | Resp 16 | Ht 63.0 in | Wt 139.0 lb

## 2013-09-14 DIAGNOSIS — N898 Other specified noninflammatory disorders of vagina: Secondary | ICD-10-CM

## 2013-09-14 NOTE — Progress Notes (Signed)
GYNECOLOGY  VISIT   HPI: 73 y.o.   Divorced  Caucasian  female   [phone] with Patient's last menstrual period was 02/27/2000.   here for   Post Op -  HYSTERECTOMY ABDOMINAL with SALPINGO OOPHORECTOMY, Abdomino-sacrocolpopexy, TVT Exact midurethral sling and cystoscopy on 07/28/13.  Still having some vaginal discharge.  It is mucousy. Not bloody.  Denies pain.  Has some right sided aching but it passes quickly.  Emptying bladder comfortably.  Voiding more slowly.  No leak with laugh or cough. Normal bowel movements.   Wearing a cardiac event monitor since August 27, 2013.  Had post op atrial fibrillation and asystole while in hospital.  Has not started Xarelto. Has been taking a baby ASA per day.  Will see Dr. Lovena Le in follow up on August 13.   GYNECOLOGIC HISTORY: Patient's last menstrual period was 02/27/2000. Contraception:   Hysterectomy  Menopausal hormone therapy: N/A        OB History   Grav Para Term Preterm Abortions TAB SAB Ect Mult Living   10 4 2 2 6 2 2 2 2 2          Patient Active Problem List   Diagnosis Date Noted  . Paroxysmal atrial fibrillation 08/26/2013  . History of atrial fibrillation 08/24/2013  . Vagally Mediated Asystole 07/29/2013  . Acute respiratory failure 07/29/2013    Past Medical History  Diagnosis Date  . Depression   . PONV (postoperative nausea and vomiting)     pt. thinks it was from dilaudid  . Hx of colonic polyps   . Asthma     as a child  . Osteopenia   . Atrial fibrillation   . Asystole   . Acute respiratory failure with hypoxia     Past Surgical History  Procedure Laterality Date  . Appendectomy    . Tubal ligation    . Cholecystectomy    . Left wrist Left     left wrist, plate inserted  . Abdominal hysterectomy N/A 07/28/2013    Procedure: HYSTERECTOMY ABDOMINAL with SALPINGO OOPHORECTOMY;  Surgeon: Lyman Speller, MD;  Location: [facility];  Service: Gynecology;  Laterality: N/A;  request 4.5 hours  . Bladder  suspension N/A 07/28/2013    Procedure: TRANSVAGINAL TAPE (TVT) PROCEDURE midurethral sling;  Surgeon: Everardo All Amundson de Berton Lan, MD;  Location: [facility];  Service: Gynecology;  Laterality: N/A;  . Abdominal sacrocolpopexy N/A 07/28/2013    Procedure: ABDOMINO SACROCOLPOPEXY;  Surgeon: Jamey Reas de Berton Lan, MD;  Location: [facility];  Service: Gynecology;  Laterality: N/A;  . Cystoscopy N/A 07/28/2013    Procedure: CYSTOSCOPY;  Surgeon: Jamey Reas de Berton Lan, MD;  Location: [facility];  Service: Gynecology;  Laterality: N/A;    Current Outpatient Prescriptions  Medication Sig Dispense Refill  . B Complex Vitamins (VITAMIN-B COMPLEX PO) Take by mouth daily.      . Biotin 1 MG CAPS Take 1 mg by mouth every other day.      . Calcium-Vitamin D-Vitamin K (VIACTIV) 219-758-83 MG-UNT-MCG CHEW Chew by mouth 2 (two) times daily.      . Cholecalciferol (VITAMIN D) 2000 UNITS CAPS Take 1 capsule by mouth daily.       Marland Kitchen glucosamine-chondroitin 500-400 MG tablet Take 1 tablet by mouth daily.      . NON FORMULARY Allergy injections once weekly      . pravastatin (PRAVACHOL) 20 MG tablet Take 20 mg by mouth daily.      Marland Kitchen  PREMARIN vaginal cream Place 1 Applicatorful vaginally 2 (two) times a week.      . VENTOLIN HFA 108 (90 BASE) MCG/ACT inhaler as needed.      . Zoledronic Acid (RECLAST IV) Inject into the vein.      . Azelastine HCl 0.15 % SOLN Place 1 spray into both nostrils as needed.       Marland Kitchen EPIPEN 2-PAK 0.3 MG/0.3ML SOAJ injection       . ibuprofen (ADVIL,MOTRIN) 800 MG tablet Take 1 tablet (800 mg total) by mouth 3 (three) times daily.  30 tablet  0   No current facility-administered medications for this visit.     ALLERGIES: Codeine and Dilaudid  Family History  Problem Relation Age of Onset  . Varicose Veins Mother   . Heart attack Father   . Cancer Brother     colon cancer (polyps)    History   Social History  . Marital Status: Divorced    Spouse Name:  N/A    Number of Children: N/A  . Years of Education: N/A   Occupational History  . Not on file.   Social History Main Topics  . Smoking status: Former Smoker -- 1.00 packs/day for 30 years    Types: Cigarettes    Quit date: 07/02/1988  . Smokeless tobacco: Never Used  . Alcohol Use: 0.5 oz/week    1 drink(s) per week     Comment: Socially   . Drug Use: No  . Sexual Activity: No     Comment: TAH/BSO   Other Topics Concern  . Not on file   Social History Narrative  . No narrative on file    ROS:  Pertinent items are noted in HPI.  PHYSICAL EXAMINATION:    BP 122/78  Pulse 78  Resp 16  Ht 5\' 3"  (1.6 m)  Wt 139 lb (63.05 kg)  BMI 24.63 kg/m2  LMP 02/27/2000     General appearance: alert, cooperative and appears stated age Heart: regular rate and rhythm Abdomen: Pfannenstiel incision and suprapubic incisions intact, soft, non-tender; no masses,  no organomegaly No abnormal inguinal nodes palpated  Pelvic: External genitalia:  no lesions              Urethra:  normal appearing urethra with no masses, tenderness or lesions.  Granulation tissue in the vaginal closure below the urethra - treated with AgNO3.  Granulation tissue at the vaginal apex treated with AGNO3 - suture sites?              Bartholins and Skenes: normal                 Vagina:  Granulation tissue at the vaginal apex treated with AGNO3 - suture sites?  Tissue tissue feels thin.               Cervix: absent                   Bimanual Exam:  Uterus:  uterus is  absent                                      Adnexa: no masses.                                       ASSESSMENT  Post op  granulation tissue.  Doing well overall.    PLAN  Will follow up for 6 week check. Continue with decreased activity.  Patient will check with Cardiology regarding the possible return to use of vaginal estrogen cream and baby ASA daily.    An After Visit Summary was printed and given to the patient.  __20____ minutes  face to face time of which over 50% was spent in counseling.

## 2013-10-08 ENCOUNTER — Ambulatory Visit (INDEPENDENT_AMBULATORY_CARE_PROVIDER_SITE_OTHER): Payer: Medicare HMO | Admitting: Internal Medicine

## 2013-10-08 ENCOUNTER — Encounter: Payer: Self-pay | Admitting: Internal Medicine

## 2013-10-08 VITALS — BP 140/82 | HR 74 | Ht 63.5 in | Wt 139.4 lb

## 2013-10-08 DIAGNOSIS — I480 Paroxysmal atrial fibrillation: Secondary | ICD-10-CM

## 2013-10-08 DIAGNOSIS — I4891 Unspecified atrial fibrillation: Secondary | ICD-10-CM

## 2013-10-08 DIAGNOSIS — I469 Cardiac arrest, cause unspecified: Secondary | ICD-10-CM

## 2013-10-08 NOTE — Assessment & Plan Note (Signed)
She is maintaining NSR. I have recommended anti-coagulation but she is not interested. She is willing to have a cardiac monitor which if shows atrial fib she would consider anti-coagulation. We will schedule an ILR for cardiac monitoring of atrial fib.

## 2013-10-08 NOTE — Patient Instructions (Signed)
Your physician recommends that you continue on your current medications as directed. Please refer to the Current Medication list given to you today.  Your physician recommends that you return for lab work 10/19/13 between 7:30am-5:15pm  Your loop recorder implantation is scheduled for 10/21/13 @ 10am

## 2013-10-08 NOTE — Progress Notes (Signed)
HPI Mrs. Matson returns today for ongoing evaluation and management of atrial fibrillation. She had post op atrial fib several month ago. She has done well in the interim. I recommended anti-coagulation at the time of her atrial fib and she declined. She has worn a cardiac monitor for 4 weeks and had no atrial fib. She did not feel her atrial fib before when she had post op atrial fib. Allergies  Allergen Reactions  . Codeine Nausea Only  . Dilaudid [Hydromorphone Hcl] Nausea And Vomiting     Current Outpatient Prescriptions  Medication Sig Dispense Refill  . B Complex Vitamins (VITAMIN-B COMPLEX PO) Take 1 capsule by mouth daily.       . Biotin 1 MG CAPS Take 1 mg by mouth daily.       . Calcium-Vitamin D-Vitamin K (VIACTIV) 409-811-91 MG-UNT-MCG CHEW Chew 1 capsule by mouth 2 (two) times daily.       . Cholecalciferol (VITAMIN D) 2000 UNITS CAPS Take 1 capsule by mouth daily.       Marland Kitchen EPIPEN 2-PAK 0.3 MG/0.3ML SOAJ injection 0.3 mg as directed.       Marland Kitchen glucosamine-chondroitin 500-400 MG tablet Take 1 tablet by mouth daily.      . NON FORMULARY Allergy injections once weekly      . pravastatin (PRAVACHOL) 20 MG tablet Take 20 mg by mouth daily.      . VENTOLIN HFA 108 (90 BASE) MCG/ACT inhaler Inhale into the lungs as needed.       . Zoledronic Acid (RECLAST IV) Inject into the vein. Uses every other year      . PREMARIN vaginal cream Place 1 Applicatorful vaginally 2 (two) times a week. Pt states she has not started this medication yet (10/08/13)       No current facility-administered medications for this visit.     Past Medical History  Diagnosis Date  . Depression   . PONV (postoperative nausea and vomiting)     pt. thinks it was from dilaudid  . Hx of colonic polyps   . Asthma     as a child  . Osteopenia   . Atrial fibrillation   . Asystole   . Acute respiratory failure with hypoxia     ROS:   All systems reviewed and negative except as noted in the  HPI.   Past Surgical History  Procedure Laterality Date  . Appendectomy    . Tubal ligation    . Cholecystectomy    . Left wrist Left     left wrist, plate inserted  . Abdominal hysterectomy N/A 07/28/2013    Procedure: HYSTERECTOMY ABDOMINAL with SALPINGO OOPHORECTOMY;  Surgeon: Lyman Speller, MD;  Location: [facility];  Service: Gynecology;  Laterality: N/A;  request 4.5 hours  . Bladder suspension N/A 07/28/2013    Procedure: TRANSVAGINAL TAPE (TVT) PROCEDURE midurethral sling;  Surgeon: Everardo All Amundson de Berton Lan, MD;  Location: [facility];  Service: Gynecology;  Laterality: N/A;  . Abdominal sacrocolpopexy N/A 07/28/2013    Procedure: ABDOMINO SACROCOLPOPEXY;  Surgeon: Jamey Reas de Berton Lan, MD;  Location: [facility];  Service: Gynecology;  Laterality: N/A;  . Cystoscopy N/A 07/28/2013    Procedure: CYSTOSCOPY;  Surgeon: Jamey Reas de Berton Lan, MD;  Location: [facility];  Service: Gynecology;  Laterality: N/A;     Family History  Problem Relation Age of Onset  . Varicose Veins Mother   . Heart attack Father   .  Cancer Brother     colon cancer (polyps)     History   Social History  . Marital Status: Divorced    Spouse Name: N/A    Number of Children: N/A  . Years of Education: N/A   Occupational History  . Not on file.   Social History Main Topics  . Smoking status: Former Smoker -- 1.00 packs/day for 30 years    Types: Cigarettes    Quit date: 07/02/1988  . Smokeless tobacco: Never Used  . Alcohol Use: 0.5 oz/week    1 drink(s) per week     Comment: Socially   . Drug Use: No  . Sexual Activity: No     Comment: TAH/BSO   Other Topics Concern  . Not on file   Social History Narrative  . No narrative on file     BP 140/82  Pulse 74  Ht 5' 3.5" (1.613 m)  Wt 139 lb 6.4 oz (63.231 kg)  BMI 24.30 kg/m2  LMP 02/27/2000  Physical Exam:  Well appearing NAD HEENT: Unremarkable Neck:  No JVD, no thyromegally Back:  No CVA  tenderness Lungs:  Clear with no wheezes HEART:  Regular rate rhythm, no murmurs, no rubs, no clicks Abd:  soft, positive bowel sounds, no organomegally, no rebound, no guarding Ext:  2 plus pulses, no edema, no cyanosis, no clubbing Skin:  No rashes no nodules Neuro:  CN II through XII intact, motor grossly intact  EKG - nsr   Assess/Plan:

## 2013-10-08 NOTE — Assessment & Plan Note (Signed)
She has had no recurrent episodes. Will follow. These appear to be related to any surgery.

## 2013-10-15 ENCOUNTER — Encounter (HOSPITAL_COMMUNITY): Payer: Self-pay | Admitting: Pharmacy Technician

## 2013-10-19 ENCOUNTER — Other Ambulatory Visit (INDEPENDENT_AMBULATORY_CARE_PROVIDER_SITE_OTHER): Payer: Medicare HMO

## 2013-10-19 DIAGNOSIS — I4891 Unspecified atrial fibrillation: Secondary | ICD-10-CM

## 2013-10-19 DIAGNOSIS — I480 Paroxysmal atrial fibrillation: Secondary | ICD-10-CM

## 2013-10-19 LAB — PROTIME-INR
INR: 1 ratio (ref 0.8–1.0)
Prothrombin Time: 10.6 s (ref 9.6–13.1)

## 2013-10-19 LAB — BASIC METABOLIC PANEL
BUN: 14 mg/dL (ref 6–23)
CO2: 29 mEq/L (ref 19–32)
Calcium: 9.8 mg/dL (ref 8.4–10.5)
Chloride: 103 mEq/L (ref 96–112)
Creatinine, Ser: 0.7 mg/dL (ref 0.4–1.2)
GFR: 83.3 mL/min (ref >60.00)
Glucose, Bld: 89 mg/dL (ref 70–99)
Potassium: 4.6 mEq/L (ref 3.5–5.1)
Sodium: 140 mEq/L (ref 135–145)

## 2013-10-19 LAB — CBC WITH DIFFERENTIAL/PLATELET
Basophils Absolute: 0.1 10*3/uL (ref 0.0–0.1)
Basophils Relative: 1.5 % (ref 0.0–3.0)
Eosinophils Absolute: 0.3 10*3/uL (ref 0.0–0.7)
Eosinophils Relative: 4.5 % (ref 0.0–5.0)
HCT: 36.8 % (ref 36.0–46.0)
Hemoglobin: 12.2 g/dL (ref 12.0–15.0)
Lymphocytes Relative: 30.6 % (ref 12.0–46.0)
Lymphs Abs: 1.8 10*3/uL (ref 0.7–4.0)
MCHC: 33.1 g/dL (ref 30.0–36.0)
MCV: 86.7 fl (ref 78.0–100.0)
Monocytes Absolute: 0.4 10*3/uL (ref 0.1–1.0)
Monocytes Relative: 7.3 % (ref 3.0–12.0)
Neutro Abs: 3.2 10*3/uL (ref 1.4–7.7)
Neutrophils Relative %: 56.1 % (ref 43.0–77.0)
Platelets: 287 10*3/uL (ref 150.0–400.0)
RBC: 4.25 Mil/uL (ref 3.87–5.11)
RDW: 14.8 % (ref 11.5–15.5)
WBC: 5.7 10*3/uL (ref 4.0–10.5)

## 2013-10-21 ENCOUNTER — Ambulatory Visit (HOSPITAL_COMMUNITY)
Admission: RE | Admit: 2013-10-21 | Discharge: 2013-10-21 | Disposition: A | Discharge status: Home / Self Care | Payer: Medicare HMO | Source: Ambulatory Visit | Attending: Internal Medicine | Admitting: Internal Medicine

## 2013-10-21 ENCOUNTER — Other Ambulatory Visit: Payer: Medicare HMO

## 2013-10-21 ENCOUNTER — Encounter (HOSPITAL_COMMUNITY)
Admission: RE | Disposition: A | Discharge status: Home / Self Care | Payer: Self-pay | Source: Ambulatory Visit | Attending: Internal Medicine

## 2013-10-21 DIAGNOSIS — I4891 Unspecified atrial fibrillation: Secondary | ICD-10-CM | POA: Diagnosis present

## 2013-10-21 DIAGNOSIS — Z87891 Personal history of nicotine dependence: Secondary | ICD-10-CM | POA: Diagnosis not present

## 2013-10-21 HISTORY — PX: LOOP RECORDER IMPLANT: SHX5477

## 2013-10-21 SURGERY — LOOP RECORDER IMPLANT
Anesthesia: LOCAL

## 2013-10-21 MED ORDER — ONDANSETRON HCL 4 MG/2ML IJ SOLN: 4.0000 mg | Freq: Four times a day (QID) | INTRAMUSCULAR | Status: DC | PRN

## 2013-10-21 MED ORDER — ACETAMINOPHEN 325 MG PO TABS: 325.0000 mg | ORAL_TABLET | ORAL | Status: DC | PRN

## 2013-10-21 MED ORDER — LIDOCAINE-EPINEPHRINE 1 %-1:100000 IJ SOLN
INTRAMUSCULAR | Status: AC
  Filled 2013-10-21: qty 1

## 2013-10-21 NOTE — Interval H&P Note (Signed)
History and Physical Interval Note:  10/21/2013 10:28 AM  Danielle Lucas  has presented today for surgery, with the diagnosis of afib  The various methods of treatment have been discussed with the patient and family. After consideration of risks, benefits and other options for treatment, the patient has consented to  Procedure(s): LOOP RECORDER IMPLANT (N/A) as a surgical intervention .  The patient's history has been reviewed, patient examined, no change in status, stable for surgery.  I have reviewed the patient's chart and labs.  Questions were answered to the patient's satisfaction.     Mikle Bosworth.D.

## 2013-10-21 NOTE — H&P (View-Only) (Signed)
HPI Danielle Lucas returns today for ongoing evaluation and management of atrial fibrillation. She had post op atrial fib several month ago. She has done well in the interim. I recommended anti-coagulation at the time of her atrial fib and she declined. She has worn a cardiac monitor for 4 weeks and had no atrial fib. She did not feel her atrial fib before when she had post op atrial fib. Allergies  Allergen Reactions  . Codeine Nausea Only  . Dilaudid [Hydromorphone Hcl] Nausea And Vomiting     Current Outpatient Prescriptions  Medication Sig Dispense Refill  . B Complex Vitamins (VITAMIN-B COMPLEX PO) Take 1 capsule by mouth daily.       . Biotin 1 MG CAPS Take 1 mg by mouth daily.       . Calcium-Vitamin D-Vitamin K (VIACTIV) 397-673-41 MG-UNT-MCG CHEW Chew 1 capsule by mouth 2 (two) times daily.       . Cholecalciferol (VITAMIN D) 2000 UNITS CAPS Take 1 capsule by mouth daily.       Marland Kitchen EPIPEN 2-PAK 0.3 MG/0.3ML SOAJ injection 0.3 mg as directed.       Marland Kitchen glucosamine-chondroitin 500-400 MG tablet Take 1 tablet by mouth daily.      . NON FORMULARY Allergy injections once weekly      . pravastatin (PRAVACHOL) 20 MG tablet Take 20 mg by mouth daily.      . VENTOLIN HFA 108 (90 BASE) MCG/ACT inhaler Inhale into the lungs as needed.       . Zoledronic Acid (RECLAST IV) Inject into the vein. Uses every other year      . PREMARIN vaginal cream Place 1 Applicatorful vaginally 2 (two) times a week. Pt states she has not started this medication yet (10/08/13)       No current facility-administered medications for this visit.     Past Medical History  Diagnosis Date  . Depression   . PONV (postoperative nausea and vomiting)     pt. thinks it was from dilaudid  . Hx of colonic polyps   . Asthma     as a child  . Osteopenia   . Atrial fibrillation   . Asystole   . Acute respiratory failure with hypoxia     ROS:   All systems reviewed and negative except as noted in the  HPI.   Past Surgical History  Procedure Laterality Date  . Appendectomy    . Tubal ligation    . Cholecystectomy    . Left wrist Left     left wrist, plate inserted  . Abdominal hysterectomy N/A 07/28/2013    Procedure: HYSTERECTOMY ABDOMINAL with SALPINGO OOPHORECTOMY;  Surgeon: Lyman Speller, MD;  Location: [facility];  Service: Gynecology;  Laterality: N/A;  request 4.5 hours  . Bladder suspension N/A 07/28/2013    Procedure: TRANSVAGINAL TAPE (TVT) PROCEDURE midurethral sling;  Surgeon: Everardo All Amundson de Berton Lan, MD;  Location: [facility];  Service: Gynecology;  Laterality: N/A;  . Abdominal sacrocolpopexy N/A 07/28/2013    Procedure: ABDOMINO SACROCOLPOPEXY;  Surgeon: Jamey Reas de Berton Lan, MD;  Location: [facility];  Service: Gynecology;  Laterality: N/A;  . Cystoscopy N/A 07/28/2013    Procedure: CYSTOSCOPY;  Surgeon: Jamey Reas de Berton Lan, MD;  Location: [facility];  Service: Gynecology;  Laterality: N/A;     Family History  Problem Relation Age of Onset  . Varicose Veins Mother   . Heart attack Father   .  Cancer Brother     colon cancer (polyps)     History   Social History  . Marital Status: Divorced    Spouse Name: N/A    Number of Children: N/A  . Years of Education: N/A   Occupational History  . Not on file.   Social History Main Topics  . Smoking status: Former Smoker -- 1.00 packs/day for 30 years    Types: Cigarettes    Quit date: 07/02/1988  . Smokeless tobacco: Never Used  . Alcohol Use: 0.5 oz/week    1 drink(s) per week     Comment: Socially   . Drug Use: No  . Sexual Activity: No     Comment: TAH/BSO   Other Topics Concern  . Not on file   Social History Narrative  . No narrative on file     BP 140/82  Pulse 74  Ht 5' 3.5" (1.613 m)  Wt 139 lb 6.4 oz (63.231 kg)  BMI 24.30 kg/m2  LMP 02/27/2000  Physical Exam:  Well appearing NAD HEENT: Unremarkable Neck:  No JVD, no thyromegally Back:  No CVA  tenderness Lungs:  Clear with no wheezes HEART:  Regular rate rhythm, no murmurs, no rubs, no clicks Abd:  soft, positive bowel sounds, no organomegally, no rebound, no guarding Ext:  2 plus pulses, no edema, no cyanosis, no clubbing Skin:  No rashes no nodules Neuro:  CN II through XII intact, motor grossly intact  EKG - nsr   Assess/Plan:

## 2013-10-21 NOTE — CV Procedure (Signed)
Electrophysiology procedure note  Procedure: Insertion of an implantable loop recorder  Indication: Atrial fibrillation monitoring  Description of the procedure: After informed consent was obtained, the patient was prepped and draped in the usual manner. 20 cc of lidocaine was infiltrated into the left pectoral region. A 1 cm stab incision was carried out. The Medtronic implantable loop recorder, serial [id number] was inserted under the skin. The R waves measured 0.4 mV. Benzoin and Steri-Strips for pain and over the incision. A bandage was applied.  Complications: No immediate complications  Conclusion: Successful insertion of a Medtronic implantable loop recorder for atrial fibrillation monitoring.  Cristopher Peru, M.D.

## 2013-10-22 ENCOUNTER — Telehealth: Payer: Self-pay | Admitting: Internal Medicine

## 2013-10-22 NOTE — Telephone Encounter (Signed)
New problem   Pt had a loop recorder placed 10/21/13 and need 10 day wd ck, there were no appt avail in ten days and pt want a call back from nurse.

## 2013-10-22 NOTE — Telephone Encounter (Signed)
Apt made for 9/3

## 2013-10-26 ENCOUNTER — Encounter: Payer: Self-pay | Admitting: Obstetrics and Gynecology

## 2013-10-26 ENCOUNTER — Ambulatory Visit (INDEPENDENT_AMBULATORY_CARE_PROVIDER_SITE_OTHER): Payer: Medicare HMO | Admitting: Obstetrics and Gynecology

## 2013-10-26 VITALS — BP 138/82 | HR 64 | Ht 63.0 in | Wt 139.8 lb

## 2013-10-26 DIAGNOSIS — Z9889 Other specified postprocedural states: Secondary | ICD-10-CM

## 2013-10-26 DIAGNOSIS — N952 Postmenopausal atrophic vaginitis: Secondary | ICD-10-CM

## 2013-10-26 DIAGNOSIS — N898 Other specified noninflammatory disorders of vagina: Secondary | ICD-10-CM

## 2013-10-26 NOTE — Progress Notes (Addendum)
Patient ID: Danielle Lucas, female   DOB: [birth year], 73 y.o.   MRN: [id number] GYNECOLOGY  VISIT   HPI: 73 y.o.   Divorced  Caucasian  female   [phone] with Patient's last menstrual period was 02/27/2000.   here for   3 month follow up. Status post TAH/BSO/colpopexy/TVT/cytsto. Patient is not using any vaginal estrogen cream. Her cardiologist approved.  No problems with bladder and bowel functional. No drainage or pain.  No bleeding.   Wants to increase physical activity.   Had a loop recorder last week for arrhythmia monitoring.  Wore an event monitor and had no atrial fibrillation.  Declines Xarelto to date.   GYNECOLOGIC HISTORY: Patient's last menstrual period was 02/27/2000. Contraception: hysterectomy   Menopausal hormone therapy: none        OB History   Grav Para Term Preterm Abortions TAB SAB Ect Mult Living   10 4 2 2 6 2 2 2 2 2          Patient Active Problem List   Diagnosis Date Noted  . Paroxysmal atrial fibrillation 08/26/2013  . History of atrial fibrillation 08/24/2013  . Vagally Mediated Asystole 07/29/2013  . Acute respiratory failure 07/29/2013    Past Medical History  Diagnosis Date  . Depression   . PONV (postoperative nausea and vomiting)     pt. thinks it was from dilaudid  . Hx of colonic polyps   . Asthma     as a child  . Osteopenia   . Atrial fibrillation   . Asystole   . Acute respiratory failure with hypoxia   . Status post placement of implantable loop recorder     Past Surgical History  Procedure Laterality Date  . Appendectomy    . Tubal ligation    . Cholecystectomy    . Left wrist Left     left wrist, plate inserted  . Abdominal hysterectomy N/A 07/28/2013    Procedure: HYSTERECTOMY ABDOMINAL with SALPINGO OOPHORECTOMY;  Surgeon: Lyman Speller, MD;  Location: [facility];  Service: Gynecology;  Laterality: N/A;  request 4.5 hours  . Bladder suspension N/A 07/28/2013    Procedure: TRANSVAGINAL TAPE (TVT) PROCEDURE  midurethral sling;  Surgeon: Everardo All Amundson de Berton Lan, MD;  Location: [facility];  Service: Gynecology;  Laterality: N/A;  . Abdominal sacrocolpopexy N/A 07/28/2013    Procedure: ABDOMINO SACROCOLPOPEXY;  Surgeon: Jamey Reas de Berton Lan, MD;  Location: [facility];  Service: Gynecology;  Laterality: N/A;  . Cystoscopy N/A 07/28/2013    Procedure: CYSTOSCOPY;  Surgeon: Jamey Reas de Berton Lan, MD;  Location: [facility];  Service: Gynecology;  Laterality: N/A;    Current Outpatient Prescriptions  Medication Sig Dispense Refill  . aspirin EC 81 MG tablet Take 81 mg by mouth daily.      . B Complex Vitamins (VITAMIN-B COMPLEX PO) Take 1 capsule by mouth daily.       . Biotin 1 MG CAPS Take 1 mg by mouth daily.       . Calcium-Vitamin D-Vitamin K (VIACTIV) 546-270-35 MG-UNT-MCG CHEW Chew 1 capsule by mouth 2 (two) times daily.       . Cholecalciferol (VITAMIN D) 2000 UNITS CAPS Take 2,000 Units by mouth daily.       Marland Kitchen EPIPEN 2-PAK 0.3 MG/0.3ML SOAJ injection Inject 0.3 mg into the muscle as directed.       . Flaxseed, Linseed, (FLAXSEED OIL PO) Take 1,400 mg by mouth daily.      Marland Kitchen  glucosamine-chondroitin 500-400 MG tablet Take 1 tablet by mouth daily.      . magnesium oxide (MAG-OX) 400 MG tablet Take 400 mg by mouth daily.      . NON FORMULARY Inject 1 each into the muscle once a week. Allergy injections once weekly      . pravastatin (PRAVACHOL) 20 MG tablet Take 20 mg by mouth daily.      . VENTOLIN HFA 108 (90 BASE) MCG/ACT inhaler Inhale 1-2 puffs into the lungs every 6 (six) hours as needed for wheezing or shortness of breath.       . Zoledronic Acid (RECLAST IV) Inject into the vein See admin instructions. Uses every other year      . PREMARIN vaginal cream Place 1 Applicatorful vaginally 2 (two) times a week. Pt states she has not started this medication yet (10/08/13)       No current facility-administered medications for this visit.     ALLERGIES: Codeine;  Dilaudid; and Morphine and related  Family History  Problem Relation Age of Onset  . Varicose Veins Mother   . Heart attack Father   . Cancer Brother     colon cancer (polyps)    History   Social History  . Marital Status: Divorced    Spouse Name: N/A    Number of Children: N/A  . Years of Education: N/A   Occupational History  . Not on file.   Social History Main Topics  . Smoking status: Former Smoker -- 1.00 packs/day for 30 years    Types: Cigarettes    Quit date: 07/02/1988  . Smokeless tobacco: Never Used  . Alcohol Use: 0.5 oz/week    1 drink(s) per week     Comment: Socially   . Drug Use: No  . Sexual Activity: No     Comment: TAH/BSO   Other Topics Concern  . Not on file   Social History Narrative  . No narrative on file    ROS:  Pertinent items are noted in HPI.  PHYSICAL EXAMINATION:    BP 138/82  Pulse 64  Ht 5\' 3"  (1.6 m)  Wt 139 lb 12.8 oz (63.413 kg)  BMI 24.77 kg/m2  LMP 02/27/2000     General appearance: alert, cooperative and appears stated age Abdomen: pfannenstiel incision, soft, non-tender; no masses,  no organomegaly    Pelvic: External genitalia:  no lesions              Urethra:  Periurethral atrophy with no masses, tenderness or lesions              Bartholins and Skenes: normal                   Vagina: normal appearing vagina with normal color and discharge,  Minimal granulation tissue of the anterior midvaginal wall.  Treated with AgNO3.  Excellent support and caliber of the vagina. No mesh exposure or erosion.               Cervix: absent                   Bimanual Exam:  Uterus:  absent                                      Adnexa:  no masses  Rectovaginal:  No.  ASSESSMENT  Doing well post op.  Minimal granulation tissue of the vagina treated with AgNO3. Urethral postmenopausal atrophy.  PLAN  Use Estrogen cream pea size amount to urethral twice weekly. May return to full  activity from gynecologic standpoint.  Patient will need to check with cardiologist regarding any physical restrictions.   Return to usual gynecologic care and prn.   An After Visit Summary was printed and given to the patient.  ___15___ minutes face to face time of which over 50% was spent in counseling.

## 2013-10-29 ENCOUNTER — Ambulatory Visit (INDEPENDENT_AMBULATORY_CARE_PROVIDER_SITE_OTHER): Payer: Medicare HMO | Admitting: *Deleted

## 2013-10-29 DIAGNOSIS — R55 Syncope and collapse: Secondary | ICD-10-CM

## 2013-10-29 LAB — MDC_IDC_ENUM_SESS_TYPE_INCLINIC

## 2013-10-29 NOTE — Progress Notes (Signed)
wound check-ILR.  Steri strips removed, wound well healed.  No episodes noted.  R-waves 0.44mV.

## 2013-10-30 ENCOUNTER — Telehealth: Payer: Self-pay | Admitting: Obstetrics & Gynecology

## 2013-10-30 NOTE — Telephone Encounter (Signed)
Pt is calling asking for her print out for what she has paid and what insurance has paid

## 2013-11-16 ENCOUNTER — Encounter: Payer: Self-pay | Admitting: Internal Medicine

## 2013-11-20 ENCOUNTER — Ambulatory Visit (INDEPENDENT_AMBULATORY_CARE_PROVIDER_SITE_OTHER): Payer: Medicare HMO | Admitting: *Deleted

## 2013-11-20 DIAGNOSIS — I480 Paroxysmal atrial fibrillation: Secondary | ICD-10-CM

## 2013-11-20 DIAGNOSIS — I4891 Unspecified atrial fibrillation: Secondary | ICD-10-CM

## 2013-12-04 LAB — MDC_IDC_ENUM_SESS_TYPE_REMOTE

## 2013-12-04 NOTE — Progress Notes (Signed)
Loop recorder 

## 2013-12-09 NOTE — Telephone Encounter (Signed)
Yes, please close note

## 2013-12-09 NOTE — Telephone Encounter (Signed)
Okay to close

## 2013-12-21 ENCOUNTER — Ambulatory Visit (INDEPENDENT_AMBULATORY_CARE_PROVIDER_SITE_OTHER): Payer: Medicare HMO | Admitting: *Deleted

## 2013-12-21 DIAGNOSIS — I480 Paroxysmal atrial fibrillation: Secondary | ICD-10-CM

## 2013-12-25 NOTE — Progress Notes (Signed)
Loop recorder 

## 2013-12-28 ENCOUNTER — Encounter: Payer: Self-pay | Admitting: Obstetrics and Gynecology

## 2013-12-31 ENCOUNTER — Encounter: Payer: Self-pay | Admitting: Internal Medicine

## 2013-12-31 LAB — MDC_IDC_ENUM_SESS_TYPE_REMOTE
Date Time Interrogation Session: 20151026041522
Zone Setting Detection Interval: 2000 ms
Zone Setting Detection Interval: 3000 ms
Zone Setting Detection Interval: 380 ms

## 2014-01-19 ENCOUNTER — Ambulatory Visit (INDEPENDENT_AMBULATORY_CARE_PROVIDER_SITE_OTHER): Payer: Medicare HMO | Admitting: *Deleted

## 2014-01-19 DIAGNOSIS — I480 Paroxysmal atrial fibrillation: Secondary | ICD-10-CM

## 2014-01-25 ENCOUNTER — Encounter: Payer: Medicare HMO | Admitting: Internal Medicine

## 2014-01-27 NOTE — Progress Notes (Signed)
Loop recorder 

## 2014-02-02 ENCOUNTER — Ambulatory Visit (INDEPENDENT_AMBULATORY_CARE_PROVIDER_SITE_OTHER): Payer: Medicare HMO | Admitting: Internal Medicine

## 2014-02-02 ENCOUNTER — Encounter: Payer: Self-pay | Admitting: Internal Medicine

## 2014-02-02 VITALS — BP 124/70 | HR 76 | Ht 63.5 in | Wt 145.6 lb

## 2014-02-02 DIAGNOSIS — I480 Paroxysmal atrial fibrillation: Secondary | ICD-10-CM

## 2014-02-02 DIAGNOSIS — I469 Cardiac arrest, cause unspecified: Secondary | ICD-10-CM

## 2014-02-02 LAB — MDC_IDC_ENUM_SESS_TYPE_INCLINIC

## 2014-02-02 NOTE — Assessment & Plan Note (Signed)
She has had no recurrent episodes of syncope, and her loop recorder demonstrates no episodes of bradycardia.

## 2014-02-02 NOTE — Assessment & Plan Note (Signed)
She has had no episode of atrial fibrillation over the past several months. We will continue watchful waiting. I discussed the possibility of atrial fibrillation for which we are able to review her episodes after several days, and also the very unlikely possibility of a thromboembolic event from this, which occurs because she is not on systemic anticoagulation. She understands this, and would like to continue taking low-dose aspirin alone.

## 2014-02-02 NOTE — Patient Instructions (Addendum)
Your physician wants you to follow-up in: 6 months with Dr Taylor You will receive a reminder letter in the mail two months in advance. If you don't receive a letter, please call our office to schedule the follow-up appointment.  

## 2014-02-02 NOTE — Progress Notes (Signed)
HPI Danielle Lucas returns today for ongoing evaluation and management of atrial fibrillation.  I recommended anti-coagulation at the time of her atrial fib and she declined. She had an implantable loop recorder placed, and returns today for follow-up. She has had no atrial fibrillation. She feels well, and denies chest pain, or shortness of breath. No peripheral edema. Allergies  Allergen Reactions  . Codeine Nausea Only  . Dilaudid [Hydromorphone Hcl] Nausea And Vomiting  . Morphine And Related Nausea Only     Current Outpatient Prescriptions  Medication Sig Dispense Refill  . aspirin EC 81 MG tablet Take 81 mg by mouth daily.    . B Complex Vitamins (VITAMIN-B COMPLEX PO) Take 1 capsule by mouth daily.     . Biotin 1 MG CAPS Take 1 mg by mouth daily.     . Calcium-Vitamin D-Vitamin K (VIACTIV) 350-093-81 MG-UNT-MCG CHEW Chew 1 capsule by mouth 2 (two) times daily.     . Cholecalciferol (VITAMIN D) 2000 UNITS CAPS Take 2,000 Units by mouth daily.     . Flaxseed, Linseed, (FLAXSEED OIL PO) Take 1,400 mg by mouth daily.    Marland Kitchen glucosamine-chondroitin 500-400 MG tablet Take 1 tablet by mouth daily.    . NON FORMULARY Inject 1 each into the muscle once a week. Allergy injections once weekly    . pravastatin (PRAVACHOL) 20 MG tablet Take 20 mg by mouth daily.    . VENTOLIN HFA 108 (90 BASE) MCG/ACT inhaler Inhale 1-2 puffs into the lungs every 6 (six) hours as needed for wheezing or shortness of breath.     . Zoledronic Acid (RECLAST IV) Inject into the vein See admin instructions. Uses every other year    . EPIPEN 2-PAK 0.3 MG/0.3ML SOAJ injection Inject 0.3 mg into the muscle as directed.     . magnesium oxide (MAG-OX) 400 MG tablet Take 400 mg by mouth daily.    Marland Kitchen PREMARIN vaginal cream Place 1 application into the vagina 2 times a week as needed     No current facility-administered medications for this visit.     Past Medical History  Diagnosis Date  . Depression   . PONV  (postoperative nausea and vomiting)     pt. thinks it was from dilaudid  . Hx of colonic polyps   . Asthma     as a child  . Osteopenia   . Atrial fibrillation   . Asystole   . Acute respiratory failure with hypoxia   . Status post placement of implantable loop recorder     ROS:   All systems reviewed and negative except as noted in the HPI.   Past Surgical History  Procedure Laterality Date  . Appendectomy    . Tubal ligation    . Cholecystectomy    . Left wrist Left     left wrist, plate inserted  . Abdominal hysterectomy N/A 07/28/2013    Procedure: HYSTERECTOMY ABDOMINAL with SALPINGO OOPHORECTOMY;  Surgeon: Lyman Speller, MD;  Location: [facility];  Service: Gynecology;  Laterality: N/A;  request 4.5 hours  . Bladder suspension N/A 07/28/2013    Procedure: TRANSVAGINAL TAPE (TVT) PROCEDURE midurethral sling;  Surgeon: Everardo All Amundson de Berton Lan, MD;  Location: [facility];  Service: Gynecology;  Laterality: N/A;  . Abdominal sacrocolpopexy N/A 07/28/2013    Procedure: ABDOMINO SACROCOLPOPEXY;  Surgeon: Jamey Reas de Berton Lan, MD;  Location: [facility];  Service: Gynecology;  Laterality: N/A;  . Cystoscopy  N/A 07/28/2013    Procedure: CYSTOSCOPY;  Surgeon: Jamey Reas de Berton Lan, MD;  Location: [facility];  Service: Gynecology;  Laterality: N/A;     Family History  Problem Relation Age of Onset  . Varicose Veins Mother   . Heart attack Father   . Cancer Brother     colon cancer (polyps)     History   Social History  . Marital Status: Divorced    Spouse Name: N/A    Number of Children: N/A  . Years of Education: N/A   Occupational History  . Not on file.   Social History Main Topics  . Smoking status: Former Smoker -- 1.00 packs/day for 30 years    Types: Cigarettes    Quit date: 07/02/1988  . Smokeless tobacco: Never Used  . Alcohol Use: 0.5 oz/week    1 drink(s) per week     Comment: Socially   . Drug Use: No  . Sexual Activity:  No     Comment: TAH/BSO   Other Topics Concern  . Not on file   Social History Narrative     BP 124/70 mmHg  Pulse 76  Ht 5' 3.5" (1.613 m)  Wt 145 lb 9.6 oz (66.044 kg)  BMI 25.38 kg/m2  LMP 02/27/2000  Physical Exam:  Well appearing 73 year old woman, NAD HEENT: Unremarkable Neck:  No JVD, no thyromegally Back:  No CVA tenderness Lungs:  Clear with no wheezes HEART:  Regular rate rhythm, no murmurs, no rubs, no clicks Abd:  soft, positive bowel sounds, no organomegally, no rebound, no guarding Ext:  2 plus pulses, no edema, no cyanosis, no clubbing Skin:  No rashes no nodules Neuro:  CN II through XII intact, motor grossly intact  Implantable loop recorder interrogation - no atrial fibrillation noted.   Assess/Plan:

## 2014-02-04 ENCOUNTER — Encounter: Payer: Self-pay | Admitting: Internal Medicine

## 2014-02-04 ENCOUNTER — Encounter (HOSPITAL_COMMUNITY): Payer: Self-pay | Admitting: Cardiovascular Disease

## 2014-02-08 ENCOUNTER — Encounter: Payer: Self-pay | Admitting: Internal Medicine

## 2014-02-09 ENCOUNTER — Encounter: Payer: Self-pay | Admitting: Obstetrics & Gynecology

## 2014-02-09 ENCOUNTER — Ambulatory Visit (INDEPENDENT_AMBULATORY_CARE_PROVIDER_SITE_OTHER): Payer: Medicare HMO | Admitting: Obstetrics & Gynecology

## 2014-02-09 VITALS — BP 140/82 | HR 80 | Resp 16 | Ht 63.5 in | Wt 146.0 lb

## 2014-02-09 DIAGNOSIS — N390 Urinary tract infection, site not specified: Secondary | ICD-10-CM

## 2014-02-09 DIAGNOSIS — Z0000 Encounter for general adult medical examination without abnormal findings: Secondary | ICD-10-CM

## 2014-02-09 DIAGNOSIS — R319 Hematuria, unspecified: Secondary | ICD-10-CM

## 2014-02-09 DIAGNOSIS — Z01419 Encounter for gynecological examination (general) (routine) without abnormal findings: Secondary | ICD-10-CM

## 2014-02-09 LAB — POCT URINALYSIS DIPSTICK
Bilirubin, UA: NEGATIVE
Glucose, UA: NEGATIVE
Ketones, UA: NEGATIVE
Nitrite, UA: POSITIVE
Protein, UA: NEGATIVE
Urobilinogen, UA: NEGATIVE
pH, UA: 6

## 2014-02-09 NOTE — Progress Notes (Signed)
73 y.o. [id number] DivorcedCaucasianF here for annual exam.  Had afib and asystole after surgery done earlier in the year.  Had implantable device to monitor heart rate placed in September.  This was done in place of Xarelto.  Pt really doesn't want to be on Xarelto so this is the compromise she has made with Dr. Lovena Le.    No virginal bleeding.  No UTIs.  Pt reports last week having some urinary urgency.  Then it went away on it's own.   Patient's last menstrual period was 02/27/2000.          Sexually active: No.  The current method of family planning is status post hysterectomy.    Exercising: Yes.    Aerobics, yoga, stretch daily.  Smoker:  no  Health Maintenance: Pap:  10/2010 Neg History of abnormal Pap:  no MMG:  10/16/12 BIRADS2: Benign Colonoscopy:  2011 BMD:   08/2010 with Dr. Joylene Draft TDaP:  2007 Screening Labs: PCP, Hb today: PCP, Urine today: RBC=trace, Nitrite Positive, WBC=Large.    reports that she quit smoking about 25 years ago. Her smoking use included Cigarettes. She has a 30 pack-year smoking history. She has never used smokeless tobacco. She reports that she drinks about 0.5 oz of alcohol per week. She reports that she does not use illicit drugs.  Past Medical History  Diagnosis Date  . Depression   . PONV (postoperative nausea and vomiting)     pt. thinks it was from dilaudid  . Hx of colonic polyps   . Asthma     as a child  . Osteopenia   . Atrial fibrillation   . Asystole   . Acute respiratory failure with hypoxia   . Status post placement of implantable loop recorder     Past Surgical History  Procedure Laterality Date  . Appendectomy    . Tubal ligation    . Cholecystectomy    . Left wrist Left     left wrist, plate inserted  . Abdominal hysterectomy N/A 07/28/2013    Procedure: HYSTERECTOMY ABDOMINAL with SALPINGO OOPHORECTOMY;  Surgeon: Lyman Speller, MD;  Location: [facility];  Service: Gynecology;  Laterality: N/A;  request 4.5 hours  . Bladder  suspension N/A 07/28/2013    Procedure: TRANSVAGINAL TAPE (TVT) PROCEDURE midurethral sling;  Surgeon: Everardo All Amundson de Berton Lan, MD;  Location: [facility];  Service: Gynecology;  Laterality: N/A;  . Abdominal sacrocolpopexy N/A 07/28/2013    Procedure: ABDOMINO SACROCOLPOPEXY;  Surgeon: Jamey Reas de Berton Lan, MD;  Location: [facility];  Service: Gynecology;  Laterality: N/A;  . Cystoscopy N/A 07/28/2013    Procedure: CYSTOSCOPY;  Surgeon: Jamey Reas de Berton Lan, MD;  Location: [facility];  Service: Gynecology;  Laterality: N/A;  . Temporary pacemaker insertion N/A 07/29/2013    Procedure: TEMPORARY PACEMAKER INSERTION;  Surgeon: Wellington Hampshire, MD;  Location: [facility];  Service: Cardiovascular;  Laterality: N/A;  . Loop recorder implant N/A 10/21/2013    Procedure: LOOP RECORDER IMPLANT;  Surgeon: Evans Lance, MD;  Location: [facility];  Service: Cardiovascular;  Laterality: N/A;    Current Outpatient Prescriptions  Medication Sig Dispense Refill  . aspirin EC 81 MG tablet Take 81 mg by mouth daily.    . B Complex Vitamins (VITAMIN-B COMPLEX PO) Take 1 capsule by mouth daily.     . Biotin 1 MG CAPS Take 1 mg by mouth daily.     . Calcium-Vitamin D-Vitamin K (  VIACTIV) 032-122-48 MG-UNT-MCG CHEW Chew 1 capsule by mouth 2 (two) times daily.     . Cholecalciferol (VITAMIN D) 2000 UNITS CAPS Take 2,000 Units by mouth daily.     Marland Kitchen escitalopram (LEXAPRO) 10 MG tablet Take 5 mg by mouth daily.    . Flaxseed, Linseed, (FLAXSEED OIL PO) Take 1,400 mg by mouth daily.    Marland Kitchen glucosamine-chondroitin 500-400 MG tablet Take 1 tablet by mouth daily.    . magnesium oxide (MAG-OX) 400 MG tablet Take 400 mg by mouth daily.    . NON FORMULARY Inject 1 each into the muscle once a week. Allergy injections once weekly    . pravastatin (PRAVACHOL) 20 MG tablet Take 20 mg by mouth daily.    Marland Kitchen PREMARIN vaginal cream Place 1 application into the vagina 2 times a week as needed    . VENTOLIN  HFA 108 (90 BASE) MCG/ACT inhaler Inhale 1-2 puffs into the lungs every 6 (six) hours as needed for wheezing or shortness of breath.     . Zoledronic Acid (RECLAST IV) Inject into the vein See admin instructions. Uses every other year    . EPIPEN 2-PAK 0.3 MG/0.3ML SOAJ injection Inject 0.3 mg into the muscle as directed.      No current facility-administered medications for this visit.    Family History  Problem Relation Age of Onset  . Varicose Veins Mother   . Heart attack Father   . Cancer Brother     colon cancer (polyps)    ROS:  Pertinent items are noted in HPI.  Otherwise, a comprehensive ROS was negative.  Exam:   BP 140/82 mmHg  Pulse 80  Resp 16  Ht 5' 3.5" (1.613 m)  Wt 146 lb (66.225 kg)  BMI 25.45 kg/m2  LMP 02/27/2000    Height: 5' 3.5" (161.3 cm)  Ht Readings from Last 3 Encounters:  02/09/14 5' 3.5" (1.613 m)  02/02/14 5' 3.5" (1.613 m)  10/26/13 5\' 3"  (1.6 m)    General appearance: alert, cooperative and appears stated age Head: Normocephalic, without obvious abnormality, atraumatic Neck: no adenopathy, supple, symmetrical, trachea midline and thyroid normal to inspection and palpation Lungs: clear to auscultation bilaterally Breasts: normal appearance, no masses or tenderness Heart: regular rate and rhythm Abdomen: soft, non-tender; bowel sounds normal; no masses,  no organomegaly Extremities: extremities normal, atraumatic, no cyanosis or edema Skin: Skin color, texture, turgor normal. No rashes or lesions Lymph nodes: Cervical, supraclavicular, and axillary nodes normal. No abnormal inguinal nodes palpated Neurologic: Grossly normal   Pelvic: External genitalia:  no lesions              Urethra:  normal appearing urethra with no masses, tenderness or lesions              Bartholins and Skenes: normal                 Vagina: normal appearing vagina with normal color and discharge, no lesions, 1st degree cystocel              Cervix: absent               Pap taken: No. Bimanual Exam:  Uterus:  uterus absent              Adnexa: normal adnexa and no mass, fullness, tenderness               Rectovaginal: Confirms  Anus:  normal sphincter tone, no lesions  Chaperone was present for exam.  A:  Well Woman with normal exam PMP, no HRT S/p TAH/BSO/abdomino-sacro-colpopexy, cystoscopy Osteopenia--followed by Dr. Joylene Draft Dense breasts H/O colonic polyps  P: Mammogram yearly. D/W 3D MMG No pap smear today Labs all with Dr. Joylene Draft.  Has appointment next week.   return annually or prn

## 2014-02-09 NOTE — Addendum Note (Signed)
Addended by: Robley Fries on: 02/09/2014 05:11 PM   Modules accepted: Orders, SmartSet

## 2014-02-10 LAB — URINALYSIS, MICROSCOPIC ONLY
Casts: NONE SEEN
Crystals: NONE SEEN
Squamous Epithelial / LPF: NONE SEEN
WBC, UA: >50 WBC/hpf — AB (ref <3)

## 2014-02-10 MED ORDER — NITROFURANTOIN MONOHYD MACRO 100 MG PO CAPS: 100.0000 mg | ORAL_CAPSULE | Freq: Two times a day (BID) | ORAL | Status: AC

## 2014-02-10 NOTE — Addendum Note (Signed)
Addended by: Graylon Good on: 02/10/2014 09:15 AM   Modules accepted: Orders, SmartSet

## 2014-02-12 LAB — URINE CULTURE: Colony Count: >=100000

## 2014-02-16 ENCOUNTER — Telehealth: Payer: Self-pay

## 2014-02-16 NOTE — Telephone Encounter (Signed)
Patient notified of results. Appointment scheduled for urine TOC.//kn

## 2014-02-16 NOTE — Telephone Encounter (Signed)
Lmtcb//kn 

## 2014-02-16 NOTE — Telephone Encounter (Signed)
-----   Message from Lyman Speller, MD sent at 02/15/2014  5:00 PM EST ----- Please inform pt urine culture was positive for e coli.  Finish all of the macrobid.  Needs TOC in aobut 2 weeks.

## 2014-02-18 ENCOUNTER — Ambulatory Visit (INDEPENDENT_AMBULATORY_CARE_PROVIDER_SITE_OTHER): Payer: Medicare HMO | Admitting: *Deleted

## 2014-02-18 DIAGNOSIS — I480 Paroxysmal atrial fibrillation: Secondary | ICD-10-CM

## 2014-02-18 LAB — MDC_IDC_ENUM_SESS_TYPE_REMOTE
Date Time Interrogation Session: 20151220062454
Zone Setting Detection Interval: 2000 ms
Zone Setting Detection Interval: 3000 ms
Zone Setting Detection Interval: 380 ms

## 2014-02-23 LAB — MDC_IDC_ENUM_SESS_TYPE_REMOTE
Date Time Interrogation Session: 20151123063618
Zone Setting Detection Interval: 2000 ms
Zone Setting Detection Interval: 3000 ms
Zone Setting Detection Interval: 380 ms

## 2014-02-24 NOTE — Progress Notes (Signed)
Loop recorder 

## 2014-03-04 ENCOUNTER — Ambulatory Visit (INDEPENDENT_AMBULATORY_CARE_PROVIDER_SITE_OTHER): Payer: PPO | Admitting: *Deleted

## 2014-03-04 VITALS — BP 126/80 | HR 78 | Resp 16 | Ht 63.5 in | Wt 146.2 lb

## 2014-03-04 DIAGNOSIS — R319 Hematuria, unspecified: Secondary | ICD-10-CM

## 2014-03-04 DIAGNOSIS — N390 Urinary tract infection, site not specified: Secondary | ICD-10-CM

## 2014-03-04 NOTE — Progress Notes (Signed)
Patient is here for Urine TOC for UTI that was treated 02/10/14.  Patient states she is feeling better Urine culture tubed up and sent to lab Patient aware someone will contact her with results.  Routed to provider for review, encounter closed.

## 2014-03-04 NOTE — Progress Notes (Signed)
Reviewed personally.  M. Suzanne Julienne Vogler, MD.  

## 2014-03-06 LAB — URINE CULTURE
Colony Count: NO GROWTH
Organism ID, Bacteria: NO GROWTH

## 2014-03-09 ENCOUNTER — Encounter: Payer: Self-pay | Admitting: Internal Medicine

## 2014-03-19 ENCOUNTER — Ambulatory Visit (INDEPENDENT_AMBULATORY_CARE_PROVIDER_SITE_OTHER): Payer: PPO | Admitting: *Deleted

## 2014-03-19 DIAGNOSIS — I480 Paroxysmal atrial fibrillation: Secondary | ICD-10-CM

## 2014-03-25 NOTE — Progress Notes (Signed)
Loop recorder 

## 2014-04-02 ENCOUNTER — Telehealth: Payer: Self-pay | Admitting: Internal Medicine

## 2014-04-02 NOTE — Telephone Encounter (Signed)
Noted in paceart.  

## 2014-04-02 NOTE — Telephone Encounter (Signed)
New Message         Pt calling stating that [place] and wanted to make [place] aware that her transmitter won't be downloading any information during that time. If you have any questions, please call pt.

## 2014-04-11 LAB — MDC_IDC_ENUM_SESS_TYPE_REMOTE
Date Time Interrogation Session: 20160125071532
Zone Setting Detection Interval: 2000 ms
Zone Setting Detection Interval: 3000 ms
Zone Setting Detection Interval: 380 ms

## 2014-04-19 ENCOUNTER — Ambulatory Visit (INDEPENDENT_AMBULATORY_CARE_PROVIDER_SITE_OTHER): Payer: PPO | Admitting: *Deleted

## 2014-04-19 DIAGNOSIS — I480 Paroxysmal atrial fibrillation: Secondary | ICD-10-CM

## 2014-04-20 ENCOUNTER — Encounter: Payer: Self-pay | Admitting: Internal Medicine

## 2014-04-20 NOTE — Progress Notes (Signed)
Loop recorder 

## 2014-05-05 LAB — MDC_IDC_ENUM_SESS_TYPE_REMOTE
Date Time Interrogation Session: 20160302052150
Zone Setting Detection Interval: 2000 ms
Zone Setting Detection Interval: 3000 ms
Zone Setting Detection Interval: 380 ms

## 2014-05-18 ENCOUNTER — Encounter: Payer: Self-pay | Admitting: Internal Medicine

## 2014-05-19 ENCOUNTER — Ambulatory Visit (INDEPENDENT_AMBULATORY_CARE_PROVIDER_SITE_OTHER): Payer: PPO | Admitting: *Deleted

## 2014-05-19 DIAGNOSIS — I480 Paroxysmal atrial fibrillation: Secondary | ICD-10-CM

## 2014-05-19 LAB — MDC_IDC_ENUM_SESS_TYPE_REMOTE
Date Time Interrogation Session: 20160405180102
Zone Setting Detection Interval: 2000 ms
Zone Setting Detection Interval: 3000 ms
Zone Setting Detection Interval: 380 ms

## 2014-05-25 NOTE — Progress Notes (Signed)
Loop recorder 

## 2014-06-18 ENCOUNTER — Ambulatory Visit (INDEPENDENT_AMBULATORY_CARE_PROVIDER_SITE_OTHER): Payer: PPO | Admitting: *Deleted

## 2014-06-18 DIAGNOSIS — I480 Paroxysmal atrial fibrillation: Secondary | ICD-10-CM

## 2014-06-18 NOTE — Progress Notes (Signed)
Loop recorder 

## 2014-06-25 ENCOUNTER — Encounter: Payer: Self-pay | Admitting: Internal Medicine

## 2014-07-14 LAB — CUP PACEART REMOTE DEVICE CHECK
Date Time Interrogation Session: 20160418031728
Zone Setting Detection Interval: 2000 ms
Zone Setting Detection Interval: 3000 ms
Zone Setting Detection Interval: 380 ms

## 2014-07-19 ENCOUNTER — Ambulatory Visit (INDEPENDENT_AMBULATORY_CARE_PROVIDER_SITE_OTHER): Payer: PPO | Admitting: *Deleted

## 2014-07-19 DIAGNOSIS — I480 Paroxysmal atrial fibrillation: Secondary | ICD-10-CM | POA: Diagnosis not present

## 2014-07-20 NOTE — Progress Notes (Signed)
Loop recorder 

## 2014-07-22 ENCOUNTER — Encounter: Payer: Self-pay | Admitting: Internal Medicine

## 2014-07-30 LAB — CUP PACEART REMOTE DEVICE CHECK
Date Time Interrogation Session: 20160510012636
Zone Setting Detection Interval: 2000 ms
Zone Setting Detection Interval: 3000 ms
Zone Setting Detection Interval: 380 ms

## 2014-08-16 ENCOUNTER — Encounter: Payer: Self-pay | Admitting: Internal Medicine

## 2014-08-17 ENCOUNTER — Ambulatory Visit (INDEPENDENT_AMBULATORY_CARE_PROVIDER_SITE_OTHER): Payer: PPO | Admitting: *Deleted

## 2014-08-17 DIAGNOSIS — I480 Paroxysmal atrial fibrillation: Secondary | ICD-10-CM

## 2014-08-18 NOTE — Progress Notes (Signed)
Loop recorder 

## 2014-08-23 ENCOUNTER — Other Ambulatory Visit: Payer: Self-pay

## 2014-08-24 LAB — CUP PACEART REMOTE DEVICE CHECK: Date Time Interrogation Session: 20160628093637

## 2014-09-08 ENCOUNTER — Encounter: Payer: Self-pay | Admitting: Internal Medicine

## 2014-09-16 ENCOUNTER — Encounter: Payer: Self-pay | Admitting: Internal Medicine

## 2014-09-16 ENCOUNTER — Ambulatory Visit (INDEPENDENT_AMBULATORY_CARE_PROVIDER_SITE_OTHER): Payer: PPO | Admitting: *Deleted

## 2014-09-16 DIAGNOSIS — I480 Paroxysmal atrial fibrillation: Secondary | ICD-10-CM | POA: Diagnosis not present

## 2014-09-16 NOTE — Progress Notes (Signed)
Loop recorder 

## 2014-09-17 LAB — CUP PACEART REMOTE DEVICE CHECK: Date Time Interrogation Session: 20160722122131

## 2014-10-07 ENCOUNTER — Encounter: Payer: Self-pay | Admitting: Internal Medicine

## 2014-10-15 ENCOUNTER — Ambulatory Visit (INDEPENDENT_AMBULATORY_CARE_PROVIDER_SITE_OTHER): Payer: PPO | Admitting: *Deleted

## 2014-10-15 DIAGNOSIS — I480 Paroxysmal atrial fibrillation: Secondary | ICD-10-CM | POA: Diagnosis not present

## 2014-10-25 LAB — CUP PACEART REMOTE DEVICE CHECK: Date Time Interrogation Session: 20160829084742

## 2014-10-28 ENCOUNTER — Encounter: Payer: Self-pay | Admitting: Cardiology

## 2014-11-03 ENCOUNTER — Encounter: Payer: Self-pay | Admitting: Internal Medicine

## 2014-11-15 ENCOUNTER — Ambulatory Visit (INDEPENDENT_AMBULATORY_CARE_PROVIDER_SITE_OTHER): Payer: PPO | Admitting: *Deleted

## 2014-11-15 DIAGNOSIS — I480 Paroxysmal atrial fibrillation: Secondary | ICD-10-CM

## 2014-11-17 NOTE — Progress Notes (Signed)
Loop recorder 

## 2014-11-20 LAB — CUP PACEART REMOTE DEVICE CHECK: Date Time Interrogation Session: 20160919164016

## 2014-11-20 NOTE — Progress Notes (Signed)
Carelink summary report received. Battery status OK. Normal device function. No new symptom episodes, tachy episodes, brady, or pause episodes. No new AF episodes. Monthly summary reports and ROV with GT on 02/03/15 at 2:15pm.

## 2014-12-02 ENCOUNTER — Encounter: Payer: Self-pay | Admitting: Internal Medicine

## 2014-12-15 ENCOUNTER — Ambulatory Visit (INDEPENDENT_AMBULATORY_CARE_PROVIDER_SITE_OTHER): Payer: PPO | Admitting: *Deleted

## 2014-12-15 DIAGNOSIS — I480 Paroxysmal atrial fibrillation: Secondary | ICD-10-CM | POA: Diagnosis not present

## 2014-12-22 NOTE — Progress Notes (Signed)
Loop recorder 

## 2015-01-04 LAB — CUP PACEART REMOTE DEVICE CHECK: Date Time Interrogation Session: 20161019170957

## 2015-01-04 NOTE — Progress Notes (Signed)
Carelink summary report received. Battery status OK. Normal device function. No new symptom episodes, tachy episodes, brady, or pause episodes. No new AF episodes. Monthly summary reports and ROV with GT on 01/14/15 at 8:45am.

## 2015-01-14 ENCOUNTER — Ambulatory Visit (INDEPENDENT_AMBULATORY_CARE_PROVIDER_SITE_OTHER): Payer: PPO | Admitting: *Deleted

## 2015-01-14 DIAGNOSIS — I480 Paroxysmal atrial fibrillation: Secondary | ICD-10-CM

## 2015-01-14 NOTE — Progress Notes (Signed)
carelink summary report / loop recorder

## 2015-02-03 ENCOUNTER — Encounter: Payer: Self-pay | Admitting: Internal Medicine

## 2015-02-03 ENCOUNTER — Ambulatory Visit (INDEPENDENT_AMBULATORY_CARE_PROVIDER_SITE_OTHER): Payer: PPO | Admitting: Internal Medicine

## 2015-02-03 VITALS — BP 128/78 | HR 77 | Ht 63.5 in | Wt 137.0 lb

## 2015-02-03 DIAGNOSIS — I480 Paroxysmal atrial fibrillation: Secondary | ICD-10-CM | POA: Diagnosis not present

## 2015-02-03 DIAGNOSIS — I469 Cardiac arrest, cause unspecified: Secondary | ICD-10-CM

## 2015-02-03 NOTE — Assessment & Plan Note (Signed)
She has had no atrial fib in over a year. She would like to hold off on anti-coagulation for now but states that she might consider if she has more sustained atrial fib.

## 2015-02-03 NOTE — Patient Instructions (Addendum)

## 2015-02-03 NOTE — Assessment & Plan Note (Signed)
She is currently asymptomatic. She will undergo watchful waiting. 

## 2015-02-03 NOTE — Progress Notes (Signed)
HPI [name] returns today for ongoing evaluation and management of atrial fibrillation.  I recommended anti-coagulation at the time of her atrial fib and she declined. She had an implantable loop recorder placed, and returns today for follow-up. She has had no atrial fibrillation since we saw her last over a year ago. She feels well, and denies chest pain, or shortness of breath. No peripheral edema. Allergies  Allergen Reactions  . Codeine Nausea Only  . Dilaudid [Hydromorphone Hcl] Nausea And Vomiting  . Morphine And Related Nausea Only     Current Outpatient Prescriptions  Medication Sig Dispense Refill  . aspirin EC 81 MG tablet Take 81 mg by mouth daily.    . B Complex Vitamins (VITAMIN-B COMPLEX PO) Take 1 capsule by mouth daily.     . Biotin 1 MG CAPS Take 1 mg by mouth daily.     . Calcium-Vitamin D-Vitamin K (VIACTIV) [id number] MG-UNT-MCG CHEW Chew 1 capsule by mouth 2 (two) times daily.     . Cholecalciferol (VITAMIN D) 2000 UNITS CAPS Take 2,000 Units by mouth daily.     Marland Kitchen EPIPEN 2-PAK 0.3 MG/0.3ML SOAJ injection Inject 0.3 mg into the muscle as directed.     . escitalopram (LEXAPRO) 10 MG tablet Take 5 mg by mouth daily.    . Flaxseed, Linseed, (FLAXSEED OIL PO) Take 1,400 mg by mouth daily.    . magnesium oxide (MAG-OX) 400 MG tablet Take 400 mg by mouth daily.    . NON FORMULARY Inject 1 each into the muscle once a week. Allergy injections once weekly    . pravastatin (PRAVACHOL) 20 MG tablet Take 20 mg by mouth daily.    Marland Kitchen PREMARIN vaginal cream Place 1 application into the vagina 2 times a week as needed    . VENTOLIN HFA 108 (90 BASE) MCG/ACT inhaler Inhale 1-2 puffs into the lungs every 6 (six) hours as needed for wheezing or shortness of breath.     . Zoledronic Acid (RECLAST IV) Inject into the vein See admin instructions. Uses every other year     No current facility-administered medications for this visit.     Past Medical History  Diagnosis  Date  . Depression   . PONV (postoperative nausea and vomiting)     pt. thinks it was from dilaudid  . Hx of colonic polyps   . Asthma     as a child  . Osteopenia   . Atrial fibrillation ([place])   . Asystole ([place])   . Acute respiratory failure with hypoxia ([place])   . Status post placement of implantable loop recorder     ROS:   All systems reviewed and negative except as noted in the HPI.   Past Surgical History  Procedure Laterality Date  . Appendectomy    . Tubal ligation    . Cholecystectomy    . Left wrist Left     left wrist, plate inserted  . Abdominal hysterectomy N/A 07/28/2013    Procedure: HYSTERECTOMY ABDOMINAL with SALPINGO OOPHORECTOMY;  Surgeon: Lyman Speller, MD;  Location: [facility];  Service: Gynecology;  Laterality: N/A;  request 4.5 hours  . Bladder suspension N/A 07/28/2013    Procedure: TRANSVAGINAL TAPE (TVT) PROCEDURE midurethral sling;  Surgeon: Everardo All Amundson de Berton Lan, MD;  Location: [facility];  Service: Gynecology;  Laterality: N/A;  . Abdominal sacrocolpopexy N/A 07/28/2013    Procedure: ABDOMINO SACROCOLPOPEXY;  Surgeon: Jamey Reas de Berton Lan, MD;  Location: [facility];  Service: Gynecology;  Laterality: N/A;  . Cystoscopy N/A 07/28/2013    Procedure: CYSTOSCOPY;  Surgeon: Jamey Reas de Berton Lan, MD;  Location: [facility];  Service: Gynecology;  Laterality: N/A;  . Temporary pacemaker insertion N/A 07/29/2013    Procedure: TEMPORARY PACEMAKER INSERTION;  Surgeon: Wellington Hampshire, MD;  Location: [facility];  Service: Cardiovascular;  Laterality: N/A;  . Loop recorder implant N/A 10/21/2013    Procedure: LOOP RECORDER IMPLANT;  Surgeon: Evans Lance, MD;  Location: [facility] CATH LAB;  Service: Cardiovascular;  Laterality: N/A;     Family History  Problem Relation Age of Onset  . Varicose Veins Mother   . Heart attack Father   . Cancer Brother     colon cancer (polyps)     Social History   Social History  . Marital  Status: Divorced    Spouse Name: N/A  . Number of Children: N/A  . Years of Education: N/A   Occupational History  . Not on file.   Social History Main Topics  . Smoking status: Former Smoker -- 1.00 packs/day for 30 years    Types: Cigarettes    Quit date: 07/02/1988  . Smokeless tobacco: Never Used  . Alcohol Use: 0.6 oz/week    1 Standard drinks or equivalent per week     Comment: Socially   . Drug Use: No  . Sexual Activity: No     Comment: TAH/BSO   Other Topics Concern  . Not on file   Social History Narrative     BP 128/78 mmHg  Pulse 77  Ht 5' 3.5" (1.613 m)  Wt 137 lb (62.143 kg)  BMI 23.88 kg/m2  SpO2 95%  LMP 02/27/2000  Physical Exam:  Well appearing 74 year old woman, NAD HEENT: Unremarkable Neck:  6 cm JVD, no thyromegally Back:  No CVA tenderness Lungs:  Clear with no wheezes HEART:  Regular rate rhythm, no murmurs, no rubs, no clicks Abd:  soft, positive bowel sounds, no organomegally, no rebound, no guarding Ext:  2 plus pulses, no edema, no cyanosis, no clubbing Skin:  No rashes no nodules Neuro:  CN II through XII intact, motor grossly intact  Implantable loop recorder interrogation - no atrial fibrillation noted.   Assess/Plan:

## 2015-02-14 ENCOUNTER — Ambulatory Visit (INDEPENDENT_AMBULATORY_CARE_PROVIDER_SITE_OTHER): Payer: PPO | Admitting: *Deleted

## 2015-02-14 DIAGNOSIS — I480 Paroxysmal atrial fibrillation: Secondary | ICD-10-CM

## 2015-02-14 LAB — CUP PACEART REMOTE DEVICE CHECK: Date Time Interrogation Session: 20161118173755

## 2015-02-15 NOTE — Progress Notes (Signed)
Carelink Summary Report / Loop Recorder 

## 2015-03-01 DIAGNOSIS — R7301 Impaired fasting glucose: Secondary | ICD-10-CM | POA: Diagnosis not present

## 2015-03-01 DIAGNOSIS — E785 Hyperlipidemia, unspecified: Secondary | ICD-10-CM | POA: Diagnosis not present

## 2015-03-01 DIAGNOSIS — J301 Allergic rhinitis due to pollen: Secondary | ICD-10-CM | POA: Diagnosis not present

## 2015-03-01 DIAGNOSIS — E784 Other hyperlipidemia: Secondary | ICD-10-CM | POA: Diagnosis not present

## 2015-03-01 DIAGNOSIS — J3089 Other allergic rhinitis: Secondary | ICD-10-CM | POA: Diagnosis not present

## 2015-03-01 DIAGNOSIS — M859 Disorder of bone density and structure, unspecified: Secondary | ICD-10-CM | POA: Diagnosis not present

## 2015-03-01 DIAGNOSIS — M8580 Other specified disorders of bone density and structure, unspecified site: Secondary | ICD-10-CM | POA: Diagnosis not present

## 2015-03-01 DIAGNOSIS — Z0000 Encounter for general adult medical examination without abnormal findings: Secondary | ICD-10-CM | POA: Diagnosis not present

## 2015-03-03 DIAGNOSIS — I480 Paroxysmal atrial fibrillation: Secondary | ICD-10-CM | POA: Diagnosis not present

## 2015-03-03 DIAGNOSIS — J302 Other seasonal allergic rhinitis: Secondary | ICD-10-CM | POA: Diagnosis not present

## 2015-03-03 DIAGNOSIS — J45909 Unspecified asthma, uncomplicated: Secondary | ICD-10-CM | POA: Diagnosis not present

## 2015-03-03 DIAGNOSIS — M810 Age-related osteoporosis without current pathological fracture: Secondary | ICD-10-CM | POA: Diagnosis not present

## 2015-03-03 DIAGNOSIS — Z0000 Encounter for general adult medical examination without abnormal findings: Secondary | ICD-10-CM | POA: Diagnosis not present

## 2015-03-03 DIAGNOSIS — R69 Illness, unspecified: Secondary | ICD-10-CM | POA: Diagnosis not present

## 2015-03-03 DIAGNOSIS — E784 Other hyperlipidemia: Secondary | ICD-10-CM | POA: Diagnosis not present

## 2015-03-03 DIAGNOSIS — M25511 Pain in right shoulder: Secondary | ICD-10-CM | POA: Diagnosis not present

## 2015-03-03 DIAGNOSIS — J301 Allergic rhinitis due to pollen: Secondary | ICD-10-CM | POA: Diagnosis not present

## 2015-03-03 DIAGNOSIS — M1990 Unspecified osteoarthritis, unspecified site: Secondary | ICD-10-CM | POA: Diagnosis not present

## 2015-03-03 DIAGNOSIS — R7301 Impaired fasting glucose: Secondary | ICD-10-CM | POA: Diagnosis not present

## 2015-03-10 DIAGNOSIS — J301 Allergic rhinitis due to pollen: Secondary | ICD-10-CM | POA: Diagnosis not present

## 2015-03-10 DIAGNOSIS — Z1212 Encounter for screening for malignant neoplasm of rectum: Secondary | ICD-10-CM | POA: Diagnosis not present

## 2015-03-10 DIAGNOSIS — J3089 Other allergic rhinitis: Secondary | ICD-10-CM | POA: Diagnosis not present

## 2015-03-15 ENCOUNTER — Ambulatory Visit (INDEPENDENT_AMBULATORY_CARE_PROVIDER_SITE_OTHER): Payer: Medicare HMO | Admitting: *Deleted

## 2015-03-15 DIAGNOSIS — I480 Paroxysmal atrial fibrillation: Secondary | ICD-10-CM

## 2015-03-15 NOTE — Progress Notes (Signed)
Carelink Summary Report / Loop Recorder 

## 2015-03-25 DIAGNOSIS — H2513 Age-related nuclear cataract, bilateral: Secondary | ICD-10-CM | POA: Diagnosis not present

## 2015-03-25 DIAGNOSIS — J3089 Other allergic rhinitis: Secondary | ICD-10-CM | POA: Diagnosis not present

## 2015-03-25 DIAGNOSIS — J301 Allergic rhinitis due to pollen: Secondary | ICD-10-CM | POA: Diagnosis not present

## 2015-03-25 DIAGNOSIS — H5203 Hypermetropia, bilateral: Secondary | ICD-10-CM | POA: Diagnosis not present

## 2015-03-28 DIAGNOSIS — J301 Allergic rhinitis due to pollen: Secondary | ICD-10-CM | POA: Diagnosis not present

## 2015-03-28 DIAGNOSIS — J3089 Other allergic rhinitis: Secondary | ICD-10-CM | POA: Diagnosis not present

## 2015-03-30 ENCOUNTER — Other Ambulatory Visit (HOSPITAL_COMMUNITY): Payer: Self-pay | Admitting: Internal Medicine

## 2015-04-01 ENCOUNTER — Encounter (HOSPITAL_COMMUNITY): Payer: Self-pay

## 2015-04-01 ENCOUNTER — Ambulatory Visit (HOSPITAL_COMMUNITY)
Admission: RE | Admit: 2015-04-01 | Discharge: 2015-04-01 | Disposition: A | Discharge status: Home / Self Care | Payer: Medicare HMO | Source: Ambulatory Visit | Attending: Internal Medicine | Admitting: Internal Medicine

## 2015-04-01 DIAGNOSIS — M810 Age-related osteoporosis without current pathological fracture: Secondary | ICD-10-CM | POA: Diagnosis not present

## 2015-04-01 MED ORDER — SODIUM CHLORIDE 0.9 % IV SOLN
Freq: Once | INTRAVENOUS | Status: AC
  Administered 2015-04-01: 15:00:00 via INTRAVENOUS

## 2015-04-01 MED ORDER — ZOLEDRONIC ACID 5 MG/100ML IV SOLN
5.0000 mg | Freq: Once | INTRAVENOUS | Status: AC
  Administered 2015-04-01: 5 mg via INTRAVENOUS
  Filled 2015-04-01: qty 100

## 2015-04-01 NOTE — Discharge Instructions (Signed)

## 2015-04-01 NOTE — Progress Notes (Signed)
Tolerated reclast w/o immediate rxn

## 2015-04-02 LAB — CUP PACEART REMOTE DEVICE CHECK: Date Time Interrogation Session: 20161218180816

## 2015-04-05 DIAGNOSIS — J3089 Other allergic rhinitis: Secondary | ICD-10-CM | POA: Diagnosis not present

## 2015-04-05 DIAGNOSIS — J301 Allergic rhinitis due to pollen: Secondary | ICD-10-CM | POA: Diagnosis not present

## 2015-04-07 DIAGNOSIS — J3089 Other allergic rhinitis: Secondary | ICD-10-CM | POA: Diagnosis not present

## 2015-04-07 DIAGNOSIS — J301 Allergic rhinitis due to pollen: Secondary | ICD-10-CM | POA: Diagnosis not present

## 2015-04-11 DIAGNOSIS — J301 Allergic rhinitis due to pollen: Secondary | ICD-10-CM | POA: Diagnosis not present

## 2015-04-11 DIAGNOSIS — J3089 Other allergic rhinitis: Secondary | ICD-10-CM | POA: Diagnosis not present

## 2015-04-12 ENCOUNTER — Encounter: Payer: Self-pay | Admitting: Obstetrics & Gynecology

## 2015-04-12 ENCOUNTER — Ambulatory Visit (INDEPENDENT_AMBULATORY_CARE_PROVIDER_SITE_OTHER): Payer: Medicare HMO | Admitting: Obstetrics & Gynecology

## 2015-04-12 VITALS — BP 120/78 | HR 62 | Resp 14 | Ht 63.0 in | Wt 132.0 lb

## 2015-04-12 DIAGNOSIS — Z23 Encounter for immunization: Secondary | ICD-10-CM | POA: Diagnosis not present

## 2015-04-12 DIAGNOSIS — Z01419 Encounter for gynecological examination (general) (routine) without abnormal findings: Secondary | ICD-10-CM | POA: Diagnosis not present

## 2015-04-12 NOTE — Progress Notes (Signed)
75 y.o. [id number] DivorcedCaucasianF here for annual exam.  Doing well.  No vaginal bleeding.  Has seen her cardiology in December.  Still not on anti-coagulation.    No vaginal bleeding.  Reports she does not have any urinary incontinence.  Does have urgency but this is not new.  Needs colonoscopy this year.  Pt is aware and states she will schedule this.  Needs tetanus and requests for [place] to do it today.  Patient's last menstrual period was 02/27/2000.          Sexually active: No.  The current method of family planning is status post hysterectomy.    Exercising: Yes.    Aerobics, yoga, zumba Smoker:  no  Health Maintenance: Pap:  01/15/13 Neg  History of abnormal Pap:  yes MMG:  11/18/14 BIRADS1:neg Colonoscopy: 07/2009 Normal - due this year.  Dr. Joylene Draft will refer her. BMD:  11/2014 Osteopenia.  She is on reclast. TDaP: w/ 2007  Screening Labs: PCP, Hb today: PCP, Urine today: PCP   reports that she quit smoking about 26 years ago. Her smoking use included Cigarettes. She has a 30 pack-year smoking history. She has never used smokeless tobacco. She reports that she drinks about 0.6 oz of alcohol per week. She reports that she does not use illicit drugs.  Past Medical History  Diagnosis Date  . Depression   . PONV (postoperative nausea and vomiting)     pt. thinks it was from dilaudid  . Hx of colonic polyps   . Asthma     as a child  . Osteopenia   . Atrial fibrillation ([place])   . Asystole ([place])   . Acute respiratory failure with hypoxia ([place])   . Status post placement of implantable loop recorder     Past Surgical History  Procedure Laterality Date  . Appendectomy    . Tubal ligation    . Cholecystectomy    . Left wrist Left     left wrist, plate inserted  . Abdominal hysterectomy N/A 07/28/2013    Procedure: HYSTERECTOMY ABDOMINAL with SALPINGO OOPHORECTOMY;  Surgeon: Lyman Speller, MD;  Location: [facility];  Service: Gynecology;  Laterality: N/A;  request 4.5 hours   . Bladder suspension N/A 07/28/2013    Procedure: TRANSVAGINAL TAPE (TVT) PROCEDURE midurethral sling;  Surgeon: Everardo All Amundson de Berton Lan, MD;  Location: [facility];  Service: Gynecology;  Laterality: N/A;  . Abdominal sacrocolpopexy N/A 07/28/2013    Procedure: ABDOMINO SACROCOLPOPEXY;  Surgeon: Jamey Reas de Berton Lan, MD;  Location: [facility];  Service: Gynecology;  Laterality: N/A;  . Cystoscopy N/A 07/28/2013    Procedure: CYSTOSCOPY;  Surgeon: Jamey Reas de Berton Lan, MD;  Location: [facility];  Service: Gynecology;  Laterality: N/A;  . Temporary pacemaker insertion N/A 07/29/2013    Procedure: TEMPORARY PACEMAKER INSERTION;  Surgeon: Wellington Hampshire, MD;  Location: [facility];  Service: Cardiovascular;  Laterality: N/A;  . Loop recorder implant N/A 10/21/2013    Procedure: LOOP RECORDER IMPLANT;  Surgeon: Evans Lance, MD;  Location: [facility];  Service: Cardiovascular;  Laterality: N/A;    Current Outpatient Prescriptions  Medication Sig Dispense Refill  . aspirin EC 81 MG tablet Take 81 mg by mouth daily.    . B Complex Vitamins (VITAMIN-B COMPLEX PO) Take 1 capsule by mouth daily.     . Biotin 1 MG CAPS Take 1 mg by mouth daily.     . Calcium-Vitamin D-Vitamin  K (VIACTIV) [id number] MG-UNT-MCG CHEW Chew 1 capsule by mouth 2 (two) times daily.     . Cholecalciferol (VITAMIN D) 2000 UNITS CAPS Take 2,000 Units by mouth daily.     . Coenzyme Q10 (CO Q 10) 10 MG CAPS Take by mouth daily.    Marland Kitchen escitalopram (LEXAPRO) 10 MG tablet Take 5 mg by mouth daily.    . Flaxseed, Linseed, (FLAXSEED OIL PO) Take 1,400 mg by mouth daily.    . NON FORMULARY Inject 1 each into the muscle once a week. Allergy injections once weekly    . pravastatin (PRAVACHOL) 20 MG tablet Take 20 mg by mouth daily.    . Turmeric 450 MG CAPS Take by mouth daily.    . VENTOLIN HFA 108 (90 BASE) MCG/ACT inhaler Inhale 1-2 puffs into the lungs every 6 (six) hours as needed for wheezing or  shortness of breath.     . Zoledronic Acid (RECLAST IV) Inject into the vein See admin instructions. Uses every other year    . EPIPEN 2-PAK 0.3 MG/0.3ML SOAJ injection Inject 0.3 mg into the muscle as directed. Reported on 04/12/2015     No current facility-administered medications for this visit.    Family History  Problem Relation Age of Onset  . Varicose Veins Mother   . Heart attack Father   . Cancer Brother     colon cancer (polyps)    ROS:  Pertinent items are noted in HPI.  Otherwise, a comprehensive ROS was negative.  Exam:   BP 120/78 mmHg  Pulse 62  Resp 14  Ht 5\' 3"  (1.6 m)  Wt 132 lb (59.875 kg)  BMI 23.39 kg/m2  LMP 02/27/2000  Weight change: -14#  Height: 5\' 3"  (160 cm)  Ht Readings from Last 3 Encounters:  04/12/15 5\' 3"  (1.6 m)  04/01/15 5' 3.5" (1.613 m)  02/03/15 5' 3.5" (1.613 m)    General appearance: alert, cooperative and appears stated age Head: Normocephalic, without obvious abnormality, atraumatic Neck: no adenopathy, supple, symmetrical, trachea midline and thyroid normal to inspection and palpation Lungs: clear to auscultation bilaterally Breasts: normal appearance, no masses or tenderness Heart: regular rate and rhythm Abdomen: soft, non-tender; bowel sounds normal; no masses,  no organomegaly Extremities: extremities normal, atraumatic, no cyanosis or edema Skin: Skin color, texture, turgor normal. No rashes or lesions Lymph nodes: Cervical, supraclavicular, and axillary nodes normal. No abnormal inguinal nodes palpated Neurologic: Grossly normal   Pelvic: External genitalia:  no lesions              Urethra:  normal appearing urethra with no masses, tenderness or lesions              Bartholins and Skenes: normal                 Vagina: normal appearing vagina with normal color and discharge, no lesions              Cervix: absent              Pap taken: No. Bimanual Exam:  Uterus:  uterus absent              Adnexa: normal adnexa and  no mass, fullness, tenderness               Rectovaginal: Confirms               Anus:  normal sphincter tone, no lesions  Chaperone was present for exam.  A:  Well Woman with normal exam PMP, no HRT S/p TAH/BSO/abdomino-sacro-colpopexy, cystoscopy 2016 Osteopenia--followed by Dr. Joylene Draft.  Doing Reclast. Dense breasts H/O colonic polyps.  Colonoscopy due.  P: Mammogram yearly. Doing 3D MMG. No pap smear today Labs all with Dr. Joylene Draft. Has follow-up labs this summer. Tdap today. return annually or prn

## 2015-04-14 ENCOUNTER — Ambulatory Visit (INDEPENDENT_AMBULATORY_CARE_PROVIDER_SITE_OTHER): Payer: Medicare HMO | Admitting: *Deleted

## 2015-04-14 DIAGNOSIS — I480 Paroxysmal atrial fibrillation: Secondary | ICD-10-CM

## 2015-04-15 NOTE — Progress Notes (Signed)
Carelink Summary Report / Loop Recorder 

## 2015-04-19 DIAGNOSIS — J301 Allergic rhinitis due to pollen: Secondary | ICD-10-CM | POA: Diagnosis not present

## 2015-04-19 DIAGNOSIS — J3089 Other allergic rhinitis: Secondary | ICD-10-CM | POA: Diagnosis not present

## 2015-04-21 DIAGNOSIS — J3089 Other allergic rhinitis: Secondary | ICD-10-CM | POA: Diagnosis not present

## 2015-04-21 DIAGNOSIS — J3081 Allergic rhinitis due to animal (cat) (dog) hair and dander: Secondary | ICD-10-CM | POA: Diagnosis not present

## 2015-04-21 DIAGNOSIS — J301 Allergic rhinitis due to pollen: Secondary | ICD-10-CM | POA: Diagnosis not present

## 2015-04-24 ENCOUNTER — Encounter: Payer: Self-pay | Admitting: Internal Medicine

## 2015-05-02 DIAGNOSIS — J301 Allergic rhinitis due to pollen: Secondary | ICD-10-CM | POA: Diagnosis not present

## 2015-05-02 DIAGNOSIS — J3089 Other allergic rhinitis: Secondary | ICD-10-CM | POA: Diagnosis not present

## 2015-05-02 LAB — CUP PACEART REMOTE DEVICE CHECK: Date Time Interrogation Session: 20170117180941

## 2015-05-02 NOTE — Progress Notes (Signed)
Carelink summary report received. Battery status OK. Normal device function. No new symptom episodes, tachy episodes, brady, or pause episodes. No new AF episodes. Monthly summary reports and ROV/PRN 

## 2015-05-03 ENCOUNTER — Telehealth: Payer: Self-pay | Admitting: *Deleted

## 2015-05-03 NOTE — Telephone Encounter (Signed)
Called patient regarding AF and pause episodes on LINQ from 04/23/15.  Patient reports that she was "up all night" throwing up on 04/23/15 and that she became dehydrated and passed out, hitting her head.  She did not seek medical attention and does not feel that she needs to follow-up with her PCP regarding her head.  She denies dizziness, blurred vision, headache, or other neurological symptoms.  She states that the stomach flu has gradually gotten better.  Encouraged hydration and salt intake while she is experiencing nausea/vomiting.  Advised patient that she should follow-up with her PCP for her head and with Dr. Lovena Le regarding the AF/pause episodes.  She states that Dr. Lovena Le "will just want to put me on Xarelto and I'm not going to take it".  Encouraged patient to consider appointment and she agrees to appointment with Dr. Lovena Le on 05/10/15 at 2:30pm.  Reviewed purpose of ILR monitoring and advised that it does not replace emergency medical attention.  Strongly advised that patient call 911 if she experiences another episode in the interim.  Also encouraged patient to call with symptom episodes.  Patient verbalizes understanding of instructions and denies any additional questions or concerns at this time.

## 2015-05-04 LAB — CUP PACEART REMOTE DEVICE CHECK: Date Time Interrogation Session: 20170216183623

## 2015-05-04 NOTE — Progress Notes (Signed)
Carelink summary report received. Battery status OK. Normal device function. No new symptom episodes, tachy episodes, brady, or pause episodes. No new AF episodes. Monthly summary reports and ROV/PRN 

## 2015-05-05 DIAGNOSIS — J301 Allergic rhinitis due to pollen: Secondary | ICD-10-CM | POA: Diagnosis not present

## 2015-05-05 DIAGNOSIS — J3089 Other allergic rhinitis: Secondary | ICD-10-CM | POA: Diagnosis not present

## 2015-05-06 NOTE — Telephone Encounter (Signed)
Per Dr. Lovena Le, ok to keep appointment on 05/10/15.

## 2015-05-10 ENCOUNTER — Ambulatory Visit (INDEPENDENT_AMBULATORY_CARE_PROVIDER_SITE_OTHER): Payer: Medicare HMO | Admitting: Internal Medicine

## 2015-05-10 ENCOUNTER — Encounter: Payer: Self-pay | Admitting: Internal Medicine

## 2015-05-10 VITALS — BP 124/60 | HR 66 | Ht 63.0 in | Wt 132.0 lb

## 2015-05-10 DIAGNOSIS — I480 Paroxysmal atrial fibrillation: Secondary | ICD-10-CM | POA: Diagnosis not present

## 2015-05-10 LAB — CUP PACEART INCLINIC DEVICE CHECK: Date Time Interrogation Session: 20170314152227

## 2015-05-10 NOTE — Patient Instructions (Addendum)
Medication Instructions:  Your physician recommends that you continue on your current medications as directed. Please refer to the Current Medication list given to you today.   Labwork: Your physician recommends that you return for lab work on 06/09/15 at [place] do not have to be fasting    Testing/Procedures: Your physician has recommended that you have a pacemaker inserted. A pacemaker is a small device that is placed under the skin of your chest or abdomen to help control abnormal heart rhythms. This device uses electrical pulses to prompt the heart to beat at a normal rate. Pacemakers are used to treat heart rhythms that are too slow. Wire (leads) are attached to the pacemaker that goes into the chambers of you heart. This is done in the hospital and usually requires and overnight stay. Please see the instruction sheet given to you today for more information.  Please arrive at the [facility] main Entrance of [facility] on 06/16/15 at 5:30am.  Do not eat or drink after midnight and do not take any medications the morning of your procedure.  Plan for one night stay.      Follow-Up:  Your physician recommends that you schedule a follow-up appointment in: 10-14 days from 06/16/15 with device clinic for wound check and 3 months from 06/16/15 with Dr Lovena Le     Any Other Special Instructions Will Be Listed Below (If Applicable).  Pre-cert sent     If you need a refill on your cardiac medications before your next appointment, please call your pharmacy.

## 2015-05-10 NOTE — Progress Notes (Signed)
HPI Danielle Lucas returns today for ongoing evaluation and management of atrial fibrillation and syncope. When I last saw her I had recommended initiating systemic anti-coagulation but she refused. In the interim, she has continued to have episodes of atrial fib, documented by her ILR, and in addition has had 2 episodes of syncope. One was associated with nausea and vomiting and she had over a 10 second pause with CHB. She awoke confused and disoriented. Allergies  Allergen Reactions  . Codeine Nausea Only  . Dilaudid [Hydromorphone Hcl] Nausea And Vomiting  . Morphine And Related Nausea Only     Current Outpatient Prescriptions  Medication Sig Dispense Refill  . aspirin EC 81 MG tablet Take 81 mg by mouth daily.    . B Complex Vitamins (VITAMIN-B COMPLEX PO) Take 1 capsule by mouth daily.     . Biotin 1 MG CAPS Take 1 mg by mouth daily.     . Calcium-Vitamin D-Vitamin K (VIACTIV) [id number] MG-UNT-MCG CHEW Chew 1 capsule by mouth 2 (two) times daily.     . Cholecalciferol (VITAMIN D) 2000 UNITS CAPS Take 2,000 Units by mouth daily.     . Coenzyme Q10 (CO Q 10) 10 MG CAPS Take 1 tablet by mouth daily.     Marland Kitchen EPIPEN 2-PAK 0.3 MG/0.3ML SOAJ injection Inject 0.3 mg into the muscle as directed. Reported on 04/12/2015    . escitalopram (LEXAPRO) 10 MG tablet Take 5 mg by mouth daily.    . Flaxseed, Linseed, (FLAXSEED OIL PO) Take 1,400 mg by mouth daily.    . NON FORMULARY Inject 1 each into the muscle once a week. Allergy injections once weekly    . pravastatin (PRAVACHOL) 20 MG tablet Take 20 mg by mouth daily.    . Turmeric 450 MG CAPS Take 1 capsule by mouth daily.     . VENTOLIN HFA 108 (90 BASE) MCG/ACT inhaler Inhale 1-2 puffs into the lungs every 6 (six) hours as needed for wheezing or shortness of breath.     . Zoledronic Acid (RECLAST IV) Inject into the vein See admin instructions. Uses every other year     No current facility-administered medications for this visit.      Past Medical History  Diagnosis Date  . Depression   . PONV (postoperative nausea and vomiting)     pt. thinks it was from dilaudid  . Hx of colonic polyps   . Asthma     as a child  . Osteopenia   . Atrial fibrillation ([place])   . Asystole ([place])   . Acute respiratory failure with hypoxia ([place])   . Status post placement of implantable loop recorder     ROS:   All systems reviewed and negative except as noted in the HPI.   Past Surgical History  Procedure Laterality Date  . Appendectomy    . Tubal ligation    . Cholecystectomy    . Left wrist Left     left wrist, plate inserted  . Abdominal hysterectomy N/A 07/28/2013    Procedure: HYSTERECTOMY ABDOMINAL with SALPINGO OOPHORECTOMY;  Surgeon: Lyman Speller, MD;  Location: [facility];  Service: Gynecology;  Laterality: N/A;  request 4.5 hours  . Bladder suspension N/A 07/28/2013    Procedure: TRANSVAGINAL TAPE (TVT) PROCEDURE midurethral sling;  Surgeon: Everardo All Amundson de Berton Lan, MD;  Location: [facility];  Service: Gynecology;  Laterality: N/A;  . Abdominal sacrocolpopexy N/A 07/28/2013    Procedure: ABDOMINO SACROCOLPOPEXY;  Surgeon: Jamey Reas de Berton Lan, MD;  Location: [facility];  Service: Gynecology;  Laterality: N/A;  . Cystoscopy N/A 07/28/2013    Procedure: CYSTOSCOPY;  Surgeon: Jamey Reas de Berton Lan, MD;  Location: [facility];  Service: Gynecology;  Laterality: N/A;  . Temporary pacemaker insertion N/A 07/29/2013    Procedure: TEMPORARY PACEMAKER INSERTION;  Surgeon: Wellington Hampshire, MD;  Location: [facility] CATH LAB;  Service: Cardiovascular;  Laterality: N/A;  . Loop recorder implant N/A 10/21/2013    Procedure: LOOP RECORDER IMPLANT;  Surgeon: Evans Lance, MD;  Location: [facility] CATH LAB;  Service: Cardiovascular;  Laterality: N/A;     Family History  Problem Relation Age of Onset  . Varicose Veins Mother   . Heart attack Father   . Cancer Brother     colon cancer (polyps)     Social  History   Social History  . Marital Status: Divorced    Spouse Name: N/A  . Number of Children: N/A  . Years of Education: N/A   Occupational History  . Not on file.   Social History Main Topics  . Smoking status: Former Smoker -- 1.00 packs/day for 30 years    Types: Cigarettes    Quit date: 07/02/1988  . Smokeless tobacco: Never Used  . Alcohol Use: 0.6 oz/week    1 Standard drinks or equivalent per week     Comment: Socially   . Drug Use: No  . Sexual Activity: No     Comment: TAH/BSO   Other Topics Concern  . Not on file   Social History Narrative     BP 124/60 mmHg  Pulse 66  Ht 5\' 3"  (1.6 m)  Wt 132 lb (59.875 kg)  BMI 23.39 kg/m2  LMP 02/27/2000  Physical Exam:  Well appearing 75 year old woman, NAD HEENT: Unremarkable Neck:  6 cm JVD, no thyromegally Back:  No CVA tenderness Lungs:  Clear with no wheezes HEART:  Regular rate rhythm, no murmurs, no rubs, no clicks Abd:  soft, positive bowel sounds, no organomegally, no rebound, no guarding Ext:  2 plus pulses, no edema, no cyanosis, no clubbing Skin:  No rashes no nodules Neuro:  CN II through XII intact, motor grossly intact  Implantable loop recorder interrogation - atrial fibrillation  With pauses as noted.   Assess/Plan: 1. Syncope - she is having long episodes of asystole with CHB. These may be vagally mediated but she really has malignant cardiac inhibition. I have recommended that she underog PPM with CLS.  2. PAF - I have again recommended starting anti-coagulation. She is considering her options. She may ultimately require medical therapy. 3. HTN - her blood pressure is well controlled. Will follow. 4. Nausea - hopefully we can work on this once her PPM is in place.  Danielle Lucas.D.

## 2015-05-16 ENCOUNTER — Ambulatory Visit (INDEPENDENT_AMBULATORY_CARE_PROVIDER_SITE_OTHER): Payer: Medicare HMO | Admitting: *Deleted

## 2015-05-16 DIAGNOSIS — I480 Paroxysmal atrial fibrillation: Secondary | ICD-10-CM | POA: Diagnosis not present

## 2015-05-17 NOTE — Progress Notes (Signed)
Carelink Summary Report / Loop Recorder 

## 2015-05-20 DIAGNOSIS — J3089 Other allergic rhinitis: Secondary | ICD-10-CM | POA: Diagnosis not present

## 2015-05-20 DIAGNOSIS — J301 Allergic rhinitis due to pollen: Secondary | ICD-10-CM | POA: Diagnosis not present

## 2015-05-28 DIAGNOSIS — I442 Atrioventricular block, complete: Secondary | ICD-10-CM

## 2015-05-28 HISTORY — DX: Atrioventricular block, complete: I44.2

## 2015-05-30 DIAGNOSIS — J3089 Other allergic rhinitis: Secondary | ICD-10-CM | POA: Diagnosis not present

## 2015-05-30 DIAGNOSIS — J301 Allergic rhinitis due to pollen: Secondary | ICD-10-CM | POA: Diagnosis not present

## 2015-06-06 DIAGNOSIS — J301 Allergic rhinitis due to pollen: Secondary | ICD-10-CM | POA: Diagnosis not present

## 2015-06-06 DIAGNOSIS — J3089 Other allergic rhinitis: Secondary | ICD-10-CM | POA: Diagnosis not present

## 2015-06-09 ENCOUNTER — Other Ambulatory Visit (INDEPENDENT_AMBULATORY_CARE_PROVIDER_SITE_OTHER): Payer: Medicare HMO | Admitting: *Deleted

## 2015-06-09 DIAGNOSIS — I4891 Unspecified atrial fibrillation: Secondary | ICD-10-CM | POA: Diagnosis not present

## 2015-06-09 DIAGNOSIS — I480 Paroxysmal atrial fibrillation: Secondary | ICD-10-CM

## 2015-06-09 LAB — CBC WITH DIFFERENTIAL/PLATELET
Basophils Absolute: 124 cells/uL (ref 0–200)
Basophils Relative: 2 %
Eosinophils Absolute: 310 cells/uL (ref 15–500)
Eosinophils Relative: 5 %
HCT: 34.3 % — ABNORMAL LOW (ref 35.0–45.0)
Hemoglobin: 11.3 g/dL — ABNORMAL LOW (ref 11.7–15.5)
Lymphocytes Relative: 30 %
Lymphs Abs: 1860 cells/uL (ref 850–3900)
MCH: 28.7 pg (ref 27.0–33.0)
MCHC: 32.9 g/dL (ref 32.0–36.0)
MCV: 87.1 fL (ref 80.0–100.0)
MPV: 10.7 fL (ref 7.5–12.5)
Monocytes Absolute: 372 cells/uL (ref 200–950)
Monocytes Relative: 6 %
Neutro Abs: 3534 cells/uL (ref 1500–7800)
Neutrophils Relative %: 57 %
Platelets: 279 10*3/uL (ref 140–400)
RBC: 3.94 MIL/uL (ref 3.80–5.10)
RDW: 14.1 % (ref 11.0–15.0)
WBC: 6.2 10*3/uL (ref 3.8–10.8)

## 2015-06-09 LAB — BASIC METABOLIC PANEL
BUN: 14 mg/dL (ref 7–25)
CO2: 29 mmol/L (ref 20–31)
Calcium: 9.3 mg/dL (ref 8.6–10.4)
Chloride: 103 mmol/L (ref 98–110)
Creat: 0.76 mg/dL (ref 0.60–0.93)
Glucose, Bld: 107 mg/dL — ABNORMAL HIGH (ref 65–99)
Potassium: 3.9 mmol/L (ref 3.5–5.3)
Sodium: 141 mmol/L (ref 135–146)

## 2015-06-13 ENCOUNTER — Ambulatory Visit (INDEPENDENT_AMBULATORY_CARE_PROVIDER_SITE_OTHER): Payer: Medicare HMO | Admitting: *Deleted

## 2015-06-13 DIAGNOSIS — I480 Paroxysmal atrial fibrillation: Secondary | ICD-10-CM

## 2015-06-14 NOTE — Progress Notes (Signed)
Carelink Summary Report / Loop Recorder 

## 2015-06-16 ENCOUNTER — Encounter (HOSPITAL_COMMUNITY)
Admission: RE | Disposition: A | Discharge status: Home / Self Care | Payer: Self-pay | Source: Ambulatory Visit | Attending: Internal Medicine

## 2015-06-16 ENCOUNTER — Encounter (HOSPITAL_COMMUNITY): Payer: Self-pay | Admitting: General Practice

## 2015-06-16 ENCOUNTER — Ambulatory Visit (HOSPITAL_COMMUNITY)
Admission: RE | Admit: 2015-06-16 | Discharge: 2015-06-17 | Disposition: A | Discharge status: Home / Self Care | Payer: Medicare HMO | Source: Ambulatory Visit | Attending: Internal Medicine | Admitting: Internal Medicine

## 2015-06-16 DIAGNOSIS — Z950 Presence of cardiac pacemaker: Secondary | ICD-10-CM

## 2015-06-16 DIAGNOSIS — I495 Sick sinus syndrome: Secondary | ICD-10-CM | POA: Diagnosis not present

## 2015-06-16 DIAGNOSIS — Z7982 Long term (current) use of aspirin: Secondary | ICD-10-CM | POA: Diagnosis not present

## 2015-06-16 DIAGNOSIS — I442 Atrioventricular block, complete: Secondary | ICD-10-CM | POA: Diagnosis not present

## 2015-06-16 DIAGNOSIS — Z4509 Encounter for adjustment and management of other cardiac device: Secondary | ICD-10-CM | POA: Diagnosis not present

## 2015-06-16 DIAGNOSIS — M8580 Other specified disorders of bone density and structure, unspecified site: Secondary | ICD-10-CM | POA: Insufficient documentation

## 2015-06-16 DIAGNOSIS — Z87891 Personal history of nicotine dependence: Secondary | ICD-10-CM | POA: Diagnosis not present

## 2015-06-16 DIAGNOSIS — I480 Paroxysmal atrial fibrillation: Secondary | ICD-10-CM | POA: Insufficient documentation

## 2015-06-16 DIAGNOSIS — I10 Essential (primary) hypertension: Secondary | ICD-10-CM | POA: Insufficient documentation

## 2015-06-16 DIAGNOSIS — F329 Major depressive disorder, single episode, unspecified: Secondary | ICD-10-CM | POA: Diagnosis not present

## 2015-06-16 DIAGNOSIS — Z7983 Long term (current) use of bisphosphonates: Secondary | ICD-10-CM | POA: Insufficient documentation

## 2015-06-16 DIAGNOSIS — Z79899 Other long term (current) drug therapy: Secondary | ICD-10-CM | POA: Insufficient documentation

## 2015-06-16 DIAGNOSIS — Z8674 Personal history of sudden cardiac arrest: Secondary | ICD-10-CM | POA: Diagnosis not present

## 2015-06-16 DIAGNOSIS — R69 Illness, unspecified: Secondary | ICD-10-CM | POA: Diagnosis not present

## 2015-06-16 DIAGNOSIS — I469 Cardiac arrest, cause unspecified: Secondary | ICD-10-CM | POA: Diagnosis present

## 2015-06-16 HISTORY — PX: LOOP RECORDER REMOVAL: SHX6723

## 2015-06-16 HISTORY — PX: EP IMPLANTABLE DEVICE: SHX172B

## 2015-06-16 HISTORY — DX: Presence of cardiac pacemaker: Z95.0

## 2015-06-16 HISTORY — DX: Atrioventricular block, complete: I44.2

## 2015-06-16 HISTORY — PX: INSERT / REPLACE / REMOVE PACEMAKER: SUR710

## 2015-06-16 HISTORY — DX: Exercise induced bronchospasm: J45.990

## 2015-06-16 LAB — SURGICAL PCR SCREEN
MRSA, PCR: NEGATIVE
Staphylococcus aureus: NEGATIVE

## 2015-06-16 SURGERY — PACEMAKER IMPLANT

## 2015-06-16 MED ORDER — CEFAZOLIN SODIUM-DEXTROSE 2-3 GM-% IV SOLR
INTRAVENOUS | Status: AC
  Filled 2015-06-16: qty 50

## 2015-06-16 MED ORDER — MUPIROCIN 2 % EX OINT
1.0000 "application " | TOPICAL_OINTMENT | Freq: Once | CUTANEOUS | Status: AC
  Administered 2015-06-16: 1 via TOPICAL
  Filled 2015-06-16: qty 22

## 2015-06-16 MED ORDER — SODIUM CHLORIDE 0.9 % IV SOLN
INTRAVENOUS | Status: DC
  Administered 2015-06-16: 07:00:00 via INTRAVENOUS

## 2015-06-16 MED ORDER — ASPIRIN EC 81 MG PO TBEC
81.0000 mg | DELAYED_RELEASE_TABLET | Freq: Every day | ORAL | Status: DC
  Administered 2015-06-16 – 2015-06-17 (×2): 81 mg via ORAL
  Filled 2015-06-16 (×2): qty 1

## 2015-06-16 MED ORDER — LORATADINE 10 MG PO TABS
10.0000 mg | ORAL_TABLET | Freq: Every day | ORAL | Status: DC
  Administered 2015-06-16 – 2015-06-17 (×2): 10 mg via ORAL
  Filled 2015-06-16 (×2): qty 1

## 2015-06-16 MED ORDER — SODIUM CHLORIDE 0.9 % IR SOLN
80.0000 mg | Status: AC
  Administered 2015-06-16: 80 mg
  Filled 2015-06-16: qty 2

## 2015-06-16 MED ORDER — MIDAZOLAM HCL 5 MG/5ML IJ SOLN
INTRAMUSCULAR | Status: DC | PRN
  Administered 2015-06-16 (×4): 1 mg via INTRAVENOUS

## 2015-06-16 MED ORDER — PRAVASTATIN SODIUM 40 MG PO TABS
20.0000 mg | ORAL_TABLET | ORAL | Status: DC
  Administered 2015-06-16: 16:00:00 20 mg via ORAL
  Filled 2015-06-16: qty 1

## 2015-06-16 MED ORDER — LIDOCAINE HCL (PF) 1 % IJ SOLN
INTRAMUSCULAR | Status: AC
  Filled 2015-06-16: qty 30

## 2015-06-16 MED ORDER — CEFAZOLIN SODIUM-DEXTROSE 2-4 GM/100ML-% IV SOLN
2.0000 g | INTRAVENOUS | Status: AC
  Administered 2015-06-16: 2 g via INTRAVENOUS
  Filled 2015-06-16: qty 100

## 2015-06-16 MED ORDER — MIDAZOLAM HCL 5 MG/5ML IJ SOLN
INTRAMUSCULAR | Status: AC
  Filled 2015-06-16: qty 5

## 2015-06-16 MED ORDER — YOU HAVE A PACEMAKER BOOK
Freq: Once | Status: AC
  Administered 2015-06-16: 23:00:00
  Filled 2015-06-16: qty 1

## 2015-06-16 MED ORDER — ALBUTEROL SULFATE (2.5 MG/3ML) 0.083% IN NEBU: 2.5000 mg | INHALATION_SOLUTION | Freq: Every day | RESPIRATORY_TRACT | Status: DC | PRN

## 2015-06-16 MED ORDER — LIDOCAINE HCL (PF) 1 % IJ SOLN
INTRAMUSCULAR | Status: DC | PRN
  Administered 2015-06-16: 50 mL
  Administered 2015-06-16: 10 mL

## 2015-06-16 MED ORDER — SODIUM CHLORIDE 0.9 % IR SOLN
Status: AC
  Filled 2015-06-16: qty 2

## 2015-06-16 MED ORDER — ACETAMINOPHEN 325 MG PO TABS: 325.0000 mg | ORAL_TABLET | ORAL | Status: DC | PRN

## 2015-06-16 MED ORDER — MUPIROCIN 2 % EX OINT
TOPICAL_OINTMENT | CUTANEOUS | Status: AC
  Administered 2015-06-16: 1 via TOPICAL
  Filled 2015-06-16: qty 22

## 2015-06-16 MED ORDER — ONDANSETRON HCL 4 MG/2ML IJ SOLN: 4.0000 mg | Freq: Four times a day (QID) | INTRAMUSCULAR | Status: DC | PRN

## 2015-06-16 MED ORDER — FENTANYL CITRATE (PF) 100 MCG/2ML IJ SOLN
INTRAMUSCULAR | Status: AC
  Filled 2015-06-16: qty 2

## 2015-06-16 MED ORDER — CEFAZOLIN SODIUM-DEXTROSE 2-4 GM/100ML-% IV SOLN
2.0000 g | Freq: Four times a day (QID) | INTRAVENOUS | Status: AC
  Administered 2015-06-16 – 2015-06-17 (×3): 2 g via INTRAVENOUS
  Filled 2015-06-16 (×4): qty 100

## 2015-06-16 MED ORDER — HEPARIN (PORCINE) IN NACL 2-0.9 UNIT/ML-% IJ SOLN
INTRAMUSCULAR | Status: AC
  Filled 2015-06-16: qty 500

## 2015-06-16 MED ORDER — HEPARIN (PORCINE) IN NACL 2-0.9 UNIT/ML-% IJ SOLN
INTRAMUSCULAR | Status: DC | PRN
  Administered 2015-06-16: 08:00:00

## 2015-06-16 MED ORDER — ESCITALOPRAM OXALATE 10 MG PO TABS
5.0000 mg | ORAL_TABLET | Freq: Every day | ORAL | Status: DC
  Administered 2015-06-17: 5 mg via ORAL
  Filled 2015-06-16 (×2): qty 1

## 2015-06-16 MED ORDER — CHLORHEXIDINE GLUCONATE 4 % EX LIQD
60.0000 mL | Freq: Once | CUTANEOUS | Status: DC
  Filled 2015-06-16: qty 60

## 2015-06-16 MED ORDER — FENTANYL CITRATE (PF) 100 MCG/2ML IJ SOLN
INTRAMUSCULAR | Status: DC | PRN
  Administered 2015-06-16 (×2): 12.5 ug via INTRAVENOUS
  Administered 2015-06-16: 25 ug via INTRAVENOUS

## 2015-06-16 SURGICAL SUPPLY — 7 items
CABLE SURGICAL S-101-97-12 (CABLE) ×1 IMPLANT
LEAD SOLIA S PRO MRI 45 (Lead) ×1 IMPLANT
LEAD SOLIA S PRO MRI 53 (Lead) ×1 IMPLANT
PAD DEFIB LIFELINK (PAD) ×1 IMPLANT
PPM ELUNA DR-T 394969 (Pacemaker) ×1 IMPLANT
SHEATH CLASSIC 7F (SHEATH) ×2 IMPLANT
TRAY PACEMAKER INSERTION (PACKS) ×1 IMPLANT

## 2015-06-16 NOTE — H&P (Signed)
HPI [name] returns today for ongoing evaluation and management of atrial fibrillation and syncope. When I last saw her I had recommended initiating systemic anti-coagulation but she refused. In the interim, she has continued to have episodes of atrial fib, documented by her ILR, and in addition has had 2 episodes of syncope. One was associated with nausea and vomiting and she had over a 10 second pause with CHB. She awoke confused and disoriented. Allergies  Allergen Reactions  . Codeine Nausea Only  . Dilaudid [Hydromorphone Hcl] Nausea And Vomiting  . Morphine And Related Nausea Only     Current Outpatient Prescriptions  Medication Sig Dispense Refill  . aspirin EC 81 MG tablet Take 81 mg by mouth daily.    . B Complex Vitamins (VITAMIN-B COMPLEX PO) Take 1 capsule by mouth daily.     . Biotin 1 MG CAPS Take 1 mg by mouth daily.     . Calcium-Vitamin D-Vitamin K (VIACTIV) [id number] MG-UNT-MCG CHEW Chew 1 capsule by mouth 2 (two) times daily.     . Cholecalciferol (VITAMIN D) 2000 UNITS CAPS Take 2,000 Units by mouth daily.     . Coenzyme Q10 (CO Q 10) 10 MG CAPS Take 1 tablet by mouth daily.     Marland Kitchen EPIPEN 2-PAK 0.3 MG/0.3ML SOAJ injection Inject 0.3 mg into the muscle as directed. Reported on 04/12/2015    . escitalopram (LEXAPRO) 10 MG tablet Take 5 mg by mouth daily.    . Flaxseed, Linseed, (FLAXSEED OIL PO) Take 1,400 mg by mouth daily.    . NON FORMULARY Inject 1 each into the muscle once a week. Allergy injections once weekly    . pravastatin (PRAVACHOL) 20 MG tablet Take 20 mg by mouth daily.    . Turmeric 450 MG CAPS Take 1 capsule by mouth daily.     . VENTOLIN HFA 108 (90 BASE) MCG/ACT inhaler Inhale 1-2 puffs into the lungs every 6 (six) hours as needed for wheezing or shortness of breath.     . Zoledronic Acid (RECLAST IV) Inject into the vein See admin instructions. Uses every other  year     No current facility-administered medications for this visit.     Past Medical History  Diagnosis Date  . Depression   . PONV (postoperative nausea and vomiting)     pt. thinks it was from dilaudid  . Hx of colonic polyps   . Asthma     as a child  . Osteopenia   . Atrial fibrillation ([place])   . Asystole ([place])   . Acute respiratory failure with hypoxia ([place])   . Status post placement of implantable loop recorder     ROS:  All systems reviewed and negative except as noted in the HPI.   Past Surgical History  Procedure Laterality Date  . Appendectomy    . Tubal ligation    . Cholecystectomy    . Left wrist Left     left wrist, plate inserted  . Abdominal hysterectomy N/A 07/28/2013    Procedure: HYSTERECTOMY ABDOMINAL with SALPINGO OOPHORECTOMY; Surgeon: Lyman Speller, MD; Location: [facility]; Service: Gynecology; Laterality: N/A; request 4.5 hours  . Bladder suspension N/A 07/28/2013    Procedure: TRANSVAGINAL TAPE (TVT) PROCEDURE midurethral sling; Surgeon: Everardo All Amundson de Berton Lan, MD; Location: [facility]; Service: Gynecology; Laterality: N/A;  . Abdominal sacrocolpopexy N/A 07/28/2013    Procedure: ABDOMINO SACROCOLPOPEXY; Surgeon: Jamey Reas de Berton Lan, MD; Location: [facility]; Service: Gynecology; Laterality:  N/A;  . Cystoscopy N/A 07/28/2013    Procedure: CYSTOSCOPY; Surgeon: Jamey Reas de Berton Lan, MD; Location: [facility]; Service: Gynecology; Laterality: N/A;  . Temporary pacemaker insertion N/A 07/29/2013    Procedure: TEMPORARY PACEMAKER INSERTION; Surgeon: Wellington Hampshire, MD; Location: [place] [facility]; Service: Cardiovascular; Laterality: N/A;  . Loop recorder implant N/A 10/21/2013    Procedure: LOOP RECORDER IMPLANT; Surgeon: Evans Lance, MD; Location: [facility]; Service: Cardiovascular;  Laterality: N/A;     Family History  Problem Relation Age of Onset  . Varicose Veins Mother   . Heart attack Father   . Cancer Brother     colon cancer (polyps)     Social History   Social History  . Marital Status: Divorced    Spouse Name: N/A  . Number of Children: N/A  . Years of Education: N/A   Occupational History  . Not on file.   Social History Main Topics  . Smoking status: Former Smoker -- 1.00 packs/day for 30 years    Types: Cigarettes    Quit date: 07/02/1988  . Smokeless tobacco: Never Used  . Alcohol Use: 0.6 oz/week    1 Standard drinks or equivalent per week     Comment: Socially   . Drug Use: No  . Sexual Activity: No     Comment: TAH/BSO   Other Topics Concern  . Not on file   Social History Narrative     BP 124/60 mmHg  Pulse 66  Ht 5\' 3"  (1.6 m)  Wt 132 lb (59.875 kg)  BMI 23.39 kg/m2  LMP 02/27/2000  Physical Exam:  Well appearing 75 year old woman, NAD HEENT: Unremarkable Neck: 6 cm JVD, no thyromegally Back: No CVA tenderness Lungs: Clear with no wheezes HEART: Regular rate rhythm, no murmurs, no rubs, no clicks Abd: soft, positive bowel sounds, no organomegally, no rebound, no guarding Ext: 2 plus pulses, no edema, no cyanosis, no clubbing Skin: No rashes no nodules Neuro: CN II through XII intact, motor grossly intact  Implantable loop recorder interrogation - atrial fibrillation With pauses as noted.   Assess/Plan: 1. Syncope - she is having long episodes of asystole with CHB. These may be vagally mediated but she really has malignant cardiac inhibition. I have recommended that she underog PPM with CLS.  2. PAF - I have again recommended starting anti-coagulation. She is considering her options. She may ultimately require medical therapy. 3. HTN - her blood pressure is well controlled. Will follow. 4. Nausea - hopefully  we can work on this once her PPM is in place.  Ponciano Ort.         EP Attending  Patient seen and examined. Since last clinic visit, no change in the history, exam and plan. She is stable for PPM insertion. Will plan to remove her ILR as well.  Mikle Bosworth.D

## 2015-06-17 ENCOUNTER — Encounter (HOSPITAL_COMMUNITY): Payer: Self-pay | Admitting: Internal Medicine

## 2015-06-17 ENCOUNTER — Ambulatory Visit (HOSPITAL_COMMUNITY): Payer: Medicare HMO

## 2015-06-17 DIAGNOSIS — I442 Atrioventricular block, complete: Secondary | ICD-10-CM | POA: Diagnosis not present

## 2015-06-17 DIAGNOSIS — Z4509 Encounter for adjustment and management of other cardiac device: Secondary | ICD-10-CM | POA: Diagnosis not present

## 2015-06-17 DIAGNOSIS — I480 Paroxysmal atrial fibrillation: Secondary | ICD-10-CM | POA: Diagnosis not present

## 2015-06-17 DIAGNOSIS — I495 Sick sinus syndrome: Secondary | ICD-10-CM | POA: Diagnosis not present

## 2015-06-17 DIAGNOSIS — R531 Weakness: Secondary | ICD-10-CM | POA: Diagnosis not present

## 2015-06-17 NOTE — Discharge Instructions (Signed)
° °  Supplemental Discharge Instructions for  Pacemaker/Defibrillator Patients  Activity No heavy lifting or vigorous activity with your left/right arm for 6 to 8 weeks.  Do not raise your left/right arm above your head for one week.  Gradually raise your affected arm as drawn below.                       04/25                     04/26                              04/27                            04/28            NO DRIVING for  1 week    ; you may begin driving on     [id number]     . WOUND CARE - Keep the wound area clean and dry.  Do not get this area wet for one week. No showers for one week; you may shower on      04/28       . - The tape/steri-strips on your wound will fall off; do not pull them off.  No bandage is needed on the site.  DO  NOT apply any creams, oils, or ointments to the wound area. - If you notice any drainage or discharge from the wound, any swelling or bruising at the site, or you develop a fever > 101? F after you are discharged home, call the office at once.  Special Instructions - You are still able to use cellular telephones; use the ear opposite the side where you have your pacemaker/defibrillator.  Avoid carrying your cellular phone near your device. - When traveling through airports, show security personnel your identification card to avoid being screened in the metal detectors.  Ask the security personnel to use the hand wand. - Avoid arc welding equipment, MRI testing (magnetic resonance imaging), TENS units (transcutaneous nerve stimulators).  Call the office for questions about other devices. - Avoid electrical appliances that are in poor condition or are not properly grounded. - Microwave ovens are safe to be near or to operate.  Additional information for defibrillator patients should your device go off: - If your device goes off ONCE and you feel fine afterward, notify the device clinic nurses. - If your device goes off ONCE and you do not feel well  afterward, call 911. - If your device goes off TWICE, call 911. - If your device goes off THREE times in one day, call 911.  DO NOT DRIVE YOURSELF OR A FAMILY MEMBER WITH A DEFIBRILLATOR TO THE HOSPITAL--CALL 911.

## 2015-06-17 NOTE — Care Management Note (Signed)
Case Management Note  Patient Details  Name: Danielle Lucas MRN: [id number] Date of Birth: Jul 31, 1940  Subjective/Objective:  Patient from home s/p pacemaker, for dc today, no needs.                   Action/Plan:   Expected Discharge Date:                  Expected Discharge Plan:  Home/Self Care  In-House Referral:     Discharge planning Services  CM Consult  Post Acute Care Choice:    Choice offered to:     DME Arranged:    DME Agency:     HH Arranged:    Pace Agency:     Status of Service:  Completed, signed off  Medicare Important Message Given:    Date Medicare IM Given:    Medicare IM give by:    Date Additional Medicare IM Given:    Additional Medicare Important Message give by:     If discussed at [place] of Stay Meetings, dates discussed:    Additional Comments:  Zenon Mayo, RN 06/17/2015, 10:24 AM

## 2015-06-17 NOTE — Discharge Summary (Signed)
Discharge Summary    Patient ID: Danielle Lucas,  MRN: [id number], DOB/AGE: 74-28-1942 74 y.o.  Admit date: 06/16/2015 Discharge date: 06/17/2015  Primary Care Provider: Crist Lucas A Primary Cardiologist: Dr Danielle Lucas  Discharge Diagnoses    Active Problems:   Vagally Mediated Asystole   Sinus node dysfunction (HCC)   Allergies Allergies  Allergen Reactions  . Other Anaphylaxis    Cat dander  . Codeine Nausea And Vomiting  . Dilaudid [Hydromorphone Hcl] Nausea And Vomiting  . Morphine And Related Nausea And Vomiting    Diagnostic Studies/Procedures    PROCEDURES:  1. Loop recorder removal.  2. Pacemaker implantation.  3. F/u CXR _____________   History of Present Illness     75 yo female w/ hx atrial fib and syncope, refusing anticoagulation. She had episodes of atrial fib, documented by her ILR, and in addition has had 2 episodes of syncope. One was associated with nausea and vomiting and she had over a 10 second pause with CHB. She has malignant cardiac inhibition, PPM recommended. She was admitted for this 04/20.  Hospital Course     Consultants: None   Biotronik (serial number [id number]) pacemaker was inserted without complication. She did well overnight. On 04/21, she was seen by Dr Danielle Lucas. Her PPM was interrogated and functioning normally. Her CXR was reviewed, results below. She is not having any symptoms of any infectious/inflammatory pulmonary process and is a never-smoker with no significant risk factors for lung cancer. She will get a f/u CXR in 4 weeks and a CT at that time if there is no improvement.   [name] had no problems with her PPM site. She was ambulating and tolerating it well. No further inpatient workup was indicated and she is considered stable for discharge, to follow up as an outpatient.  _____________  Discharge Vitals Blood pressure 134/63, pulse 83, temperature 98.3 F (36.8 C), temperature source Oral, resp. rate 18,  height 5\' 3"  (1.6 m), weight 132 lb 11.5 oz (60.2 kg), last menstrual period 02/27/2000, SpO2 98 %.  Filed Weights   06/16/15 0613 06/17/15 0428  Weight: 130 lb (58.968 kg) 132 lb 11.5 oz (60.2 kg)  General: Well developed, well nourished, female in no acute distress Head: Eyes PERRLA, No xanthomas.   Normocephalic and atraumatic  Lungs: decreased BS bases; PPM site without hematoma or ecchymosis. Heart: HRRR S1 S2, without MRG.  Pulses are 2+ & equal. No JVD. Abdomen: Bowel sounds are present, abdomen soft and non-tender without masses or  hernias noted. Msk: Normal strength and tone for age. Extremities: No clubbing, cyanosis or edema.    Skin:  No rashes or lesions noted. Neuro: Alert and oriented X 3. Psych:  Good affect, responds appropriately   Labs & Radiologic Studies    Dg Chest 2 View 06/17/2015  CLINICAL DATA:  75 year old female with a history of weakness EXAM: CHEST - 2 VIEW COMPARISON:  07/30/2013, 07/29/2013 FINDINGS: Cardiomediastinal silhouette unchanged. Interval placement of left chest wall cardiac pacing device with 2 leads in place. Compared to prior plain film there is improved aeration at the bilateral lung bases with resolution of the pleural fluid. Opacity at the base of the lung on the lateral view projecting over the spine. No pleural effusion or pneumothorax. Chronic interstitial opacities, with evidence of emphysema. No displaced fracture. Unremarkable appearance of the upper abdomen. IMPRESSION: Improved aeration of the lungs from the comparison of 2015, however, there is focal opacity at the posterior base on the  lateral view. If the patient is not presenting with a concern for pneumonia/ pneumonitis, further evaluation with chest CT is recommended. If the patient requires treatment for pneumonia, follow-up chest x-ray in 4 weeks-6 weeks is recommended to assure resolution. These results will be called to the ordering clinician or representative by the Radiologist  Assistant, and communication documented in the PACS or zVision Dashboard. Interval placement of left chest wall cardiac pacing device. Developing emphysema. Signed, Danielle Lucas. Danielle Newport, DO [facility] Radiology Electronically Signed   By: Danielle Lucas D.O.   On: 06/17/2015 08:01   __________   Disposition   Pt is being discharged home today in good condition.  Follow-up Plans & Appointments    Follow-up Information    Follow up with [facility] On 06/29/2015.   Specialty:  Cardiology   Why:  Please arrive at 3:15 pm for a 3:30 pm wound check.   Contact information:   [address] [place] [phone]       Discharge Medications   Current Discharge Medication List    CONTINUE these medications which have NOT CHANGED   Details  aspirin EC 81 MG tablet Take 81 mg by mouth daily.    B Complex Vitamins (VITAMIN-B COMPLEX PO) Take 1 capsule by mouth daily.     Biotin 1 MG CAPS Take 1 mg by mouth daily.     Calcium-Vitamin D-Vitamin K (VIACTIV) [id number] MG-UNT-MCG CHEW Chew 1 capsule by mouth 2 (two) times daily.     Cholecalciferol (VITAMIN D) 2000 UNITS CAPS Take 2,000 Units by mouth daily.     Coenzyme Q10 (CO Q 10) 10 MG CAPS Take 10 mg by mouth daily.     EPIPEN 2-PAK 0.3 MG/0.3ML SOAJ injection Inject 0.3 mg into the muscle as directed. Reported on 04/12/2015    escitalopram (LEXAPRO) 10 MG tablet Take 5 mg by mouth daily.    Flaxseed, Linseed, (FLAXSEED OIL PO) Take 1,400 mg by mouth daily.    NON FORMULARY Inject 1 each into the muscle once a week. Allergy injections once weekly    Turmeric 450 MG CAPS Take 450 mg by mouth daily.     pravastatin (PRAVACHOL) 20 MG tablet Take 20 mg by mouth 2 (two) times a week.     VENTOLIN HFA 108 (90 BASE) MCG/ACT inhaler Inhale 1-2 puffs into the lungs daily as needed for wheezing or shortness of breath.     Zoledronic  Acid (RECLAST IV) Inject into the vein See admin instructions. Uses every other year          Outstanding Labs/Studies   None  Duration of Discharge Encounter   Greater than 30 minutes including physician time.  Jonetta Speak NP 06/17/2015, 8:25 AM  EP Attending  Patient seen and examined. Agree with the findings as noted above. The patient is stable for discharge. Will followup CXR. My clinical suspicion for anything bad is low on the CXR. Her DDD PM is working normally.  Mikle Bosworth.D.

## 2015-06-23 DIAGNOSIS — J301 Allergic rhinitis due to pollen: Secondary | ICD-10-CM | POA: Diagnosis not present

## 2015-06-23 DIAGNOSIS — J3089 Other allergic rhinitis: Secondary | ICD-10-CM | POA: Diagnosis not present

## 2015-06-29 ENCOUNTER — Ambulatory Visit (INDEPENDENT_AMBULATORY_CARE_PROVIDER_SITE_OTHER): Payer: Medicare HMO | Admitting: *Deleted

## 2015-06-29 ENCOUNTER — Encounter: Payer: Self-pay | Admitting: Internal Medicine

## 2015-06-29 DIAGNOSIS — I469 Cardiac arrest, cause unspecified: Secondary | ICD-10-CM | POA: Diagnosis not present

## 2015-06-29 DIAGNOSIS — I480 Paroxysmal atrial fibrillation: Secondary | ICD-10-CM

## 2015-06-29 DIAGNOSIS — R55 Syncope and collapse: Secondary | ICD-10-CM

## 2015-06-29 LAB — CUP PACEART INCLINIC DEVICE CHECK
Brady Statistic RA Percent Paced: 41 %
Brady Statistic RV Percent Paced: 0 %
Date Time Interrogation Session: 20170503193900
Implantable Lead Implant Date: 20170420
Implantable Lead Implant Date: 20170420
Implantable Lead Location: 753859
Implantable Lead Location: 753860
Implantable Lead Model: 377
Implantable Lead Model: 377
Implantable Lead Serial Number: 49378160
Implantable Lead Serial Number: 49421706
Lead Channel Impedance Value: 448 Ohm
Lead Channel Impedance Value: 702 Ohm
Lead Channel Pacing Threshold Amplitude: 0.8 V
Lead Channel Pacing Threshold Amplitude: 0.8 V
Lead Channel Pacing Threshold Amplitude: 0.8 V
Lead Channel Pacing Threshold Amplitude: 1 V
Lead Channel Pacing Threshold Amplitude: 1 V
Lead Channel Pacing Threshold Amplitude: 1 V
Lead Channel Pacing Threshold Pulse Width: 0.4 ms
Lead Channel Pacing Threshold Pulse Width: 0.4 ms
Lead Channel Pacing Threshold Pulse Width: 0.4 ms
Lead Channel Pacing Threshold Pulse Width: 0.4 ms
Lead Channel Pacing Threshold Pulse Width: 0.4 ms
Lead Channel Pacing Threshold Pulse Width: 0.4 ms
Lead Channel Sensing Intrinsic Amplitude: 13.6 mV
Lead Channel Sensing Intrinsic Amplitude: 14 mV
Lead Channel Sensing Intrinsic Amplitude: 4.6 mV
Lead Channel Sensing Intrinsic Amplitude: 4.8 mV
Lead Channel Setting Pacing Amplitude: 3 V
Lead Channel Setting Pacing Amplitude: 3 V
Lead Channel Setting Pacing Pulse Width: 0.4 ms
Pulse Gen Model: 394969
Pulse Gen Serial Number: 68713503

## 2015-06-29 NOTE — Progress Notes (Signed)
Wound check appointment s/p linq explant and PPM implant 06/16/15. Steri-strips removed. Wounds without redness or edema. Incision edges approximated, wounds well healed. Normal device function. Thresholds, sensing, and impedances consistent with implant measurements. Device programmed at 3V for extra safety margin until 3 month visit. Histogram distribution appropriate for patient and level of activity. No mode switches or high ventricular rates noted. Patient educated about wound care, arm mobility, lifting restrictions. ROV with GT 09/22/15.

## 2015-07-04 DIAGNOSIS — J301 Allergic rhinitis due to pollen: Secondary | ICD-10-CM | POA: Diagnosis not present

## 2015-07-04 DIAGNOSIS — J3089 Other allergic rhinitis: Secondary | ICD-10-CM | POA: Diagnosis not present

## 2015-07-11 DIAGNOSIS — J3089 Other allergic rhinitis: Secondary | ICD-10-CM | POA: Diagnosis not present

## 2015-07-11 DIAGNOSIS — J301 Allergic rhinitis due to pollen: Secondary | ICD-10-CM | POA: Diagnosis not present

## 2015-07-14 DIAGNOSIS — R69 Illness, unspecified: Secondary | ICD-10-CM | POA: Diagnosis not present

## 2015-07-22 DIAGNOSIS — J3089 Other allergic rhinitis: Secondary | ICD-10-CM | POA: Diagnosis not present

## 2015-07-22 LAB — CUP PACEART REMOTE DEVICE CHECK: Date Time Interrogation Session: 20170318190826

## 2015-07-25 LAB — CUP PACEART REMOTE DEVICE CHECK: Date Time Interrogation Session: 20170417193523

## 2015-07-25 NOTE — Progress Notes (Signed)
Carelink summary report received. Battery status OK. Normal device function. No new symptom episodes, tachy episodes, brady, or pause episodes. No new AF episodes. Monthly summary reports and ROV/PRN 

## 2015-07-29 ENCOUNTER — Other Ambulatory Visit: Payer: Self-pay | Admitting: Internal Medicine

## 2015-07-29 DIAGNOSIS — L918 Other hypertrophic disorders of the skin: Secondary | ICD-10-CM | POA: Diagnosis not present

## 2015-07-29 DIAGNOSIS — D2372 Other benign neoplasm of skin of left lower limb, including hip: Secondary | ICD-10-CM | POA: Diagnosis not present

## 2015-07-29 DIAGNOSIS — Z87891 Personal history of nicotine dependence: Secondary | ICD-10-CM

## 2015-07-29 DIAGNOSIS — L821 Other seborrheic keratosis: Secondary | ICD-10-CM | POA: Diagnosis not present

## 2015-07-29 DIAGNOSIS — L720 Epidermal cyst: Secondary | ICD-10-CM | POA: Diagnosis not present

## 2015-07-29 DIAGNOSIS — D224 Melanocytic nevi of scalp and neck: Secondary | ICD-10-CM | POA: Diagnosis not present

## 2015-07-29 DIAGNOSIS — L814 Other melanin hyperpigmentation: Secondary | ICD-10-CM | POA: Diagnosis not present

## 2015-08-01 DIAGNOSIS — J301 Allergic rhinitis due to pollen: Secondary | ICD-10-CM | POA: Diagnosis not present

## 2015-08-01 DIAGNOSIS — J3089 Other allergic rhinitis: Secondary | ICD-10-CM | POA: Diagnosis not present

## 2015-08-05 ENCOUNTER — Inpatient Hospital Stay: Admission: RE | Admit: 2015-08-05 | Payer: Medicare HMO | Source: Ambulatory Visit

## 2015-08-08 DIAGNOSIS — J301 Allergic rhinitis due to pollen: Secondary | ICD-10-CM | POA: Diagnosis not present

## 2015-08-08 DIAGNOSIS — Z800 Family history of malignant neoplasm of digestive organs: Secondary | ICD-10-CM | POA: Diagnosis not present

## 2015-08-08 DIAGNOSIS — J3089 Other allergic rhinitis: Secondary | ICD-10-CM | POA: Diagnosis not present

## 2015-08-08 DIAGNOSIS — R001 Bradycardia, unspecified: Secondary | ICD-10-CM | POA: Diagnosis not present

## 2015-08-08 DIAGNOSIS — I480 Paroxysmal atrial fibrillation: Secondary | ICD-10-CM | POA: Diagnosis not present

## 2015-09-05 DIAGNOSIS — J3089 Other allergic rhinitis: Secondary | ICD-10-CM | POA: Diagnosis not present

## 2015-09-05 DIAGNOSIS — J301 Allergic rhinitis due to pollen: Secondary | ICD-10-CM | POA: Diagnosis not present

## 2015-09-15 DIAGNOSIS — K6389 Other specified diseases of intestine: Secondary | ICD-10-CM | POA: Diagnosis not present

## 2015-09-15 DIAGNOSIS — Z800 Family history of malignant neoplasm of digestive organs: Secondary | ICD-10-CM | POA: Diagnosis not present

## 2015-09-15 DIAGNOSIS — Z1211 Encounter for screening for malignant neoplasm of colon: Secondary | ICD-10-CM | POA: Diagnosis not present

## 2015-09-15 DIAGNOSIS — K635 Polyp of colon: Secondary | ICD-10-CM | POA: Diagnosis not present

## 2015-09-15 DIAGNOSIS — D122 Benign neoplasm of ascending colon: Secondary | ICD-10-CM | POA: Diagnosis not present

## 2015-09-22 ENCOUNTER — Encounter: Payer: Self-pay | Admitting: Internal Medicine

## 2015-09-22 ENCOUNTER — Ambulatory Visit (INDEPENDENT_AMBULATORY_CARE_PROVIDER_SITE_OTHER): Payer: Medicare HMO | Admitting: Internal Medicine

## 2015-09-22 ENCOUNTER — Other Ambulatory Visit: Payer: Self-pay

## 2015-09-22 VITALS — BP 138/84 | HR 72 | Ht 63.5 in | Wt 133.4 lb

## 2015-09-22 DIAGNOSIS — I495 Sick sinus syndrome: Secondary | ICD-10-CM | POA: Diagnosis not present

## 2015-09-22 LAB — CUP PACEART INCLINIC DEVICE CHECK
Date Time Interrogation Session: 20170727170034
Implantable Lead Implant Date: 20170420
Implantable Lead Implant Date: 20170420
Implantable Lead Location: 753859
Implantable Lead Location: 753860
Implantable Lead Model: 377
Implantable Lead Model: 377
Implantable Lead Serial Number: 49378160
Implantable Lead Serial Number: 49421706
Lead Channel Impedance Value: 468 Ohm
Lead Channel Impedance Value: 721 Ohm
Lead Channel Pacing Threshold Amplitude: 0.6 V
Lead Channel Pacing Threshold Amplitude: 1 V
Lead Channel Pacing Threshold Pulse Width: 0.4 ms
Lead Channel Pacing Threshold Pulse Width: 0.4 ms
Lead Channel Sensing Intrinsic Amplitude: 15 mV
Lead Channel Sensing Intrinsic Amplitude: 5.3 mV
Lead Channel Setting Pacing Amplitude: 2 V
Lead Channel Setting Pacing Amplitude: 2.4 V
Lead Channel Setting Pacing Pulse Width: 0.4 ms
Pulse Gen Model: 394969
Pulse Gen Serial Number: 68713503

## 2015-09-22 NOTE — Progress Notes (Signed)
HPI Danielle Lucas returns today for ongoing evaluation and management of atrial fibrillation and syncope. When I last saw her I had recommended initiating systemic anti-coagulation but she refused. In the interim, she had a 10 second pause and underwent insertion of a PPM. Since then she has done well and denies chest pain or sob. No palpitations.  Allergies  Allergen Reactions  . Other Anaphylaxis    Cat dander  . Codeine Nausea And Vomiting  . Dilaudid [Hydromorphone Hcl] Nausea And Vomiting  . Morphine And Related Nausea And Vomiting     Current Outpatient Prescriptions  Medication Sig Dispense Refill  . aspirin EC 81 MG tablet Take 81 mg by mouth daily.    . B Complex Vitamins (VITAMIN-B COMPLEX PO) Take 1 capsule by mouth daily.     . Biotin 1 MG CAPS Take 1 mg by mouth daily.     . Calcium-Vitamin D-Vitamin K (VIACTIV) 557-322-02 MG-UNT-MCG CHEW Chew 1 capsule by mouth 2 (two) times daily.     . Cholecalciferol (VITAMIN D) 2000 UNITS CAPS Take 2,000 Units by mouth daily.     . Coenzyme Q10 (CO Q 10) 10 MG CAPS Take 10 mg by mouth daily.     Marland Kitchen EPIPEN 2-PAK 0.3 MG/0.3ML SOAJ injection Inject 0.3 mg into the muscle as directed. Reported on 04/12/2015    . escitalopram (LEXAPRO) 10 MG tablet Take 5 mg by mouth daily.    . Flaxseed, Linseed, (FLAXSEED OIL PO) Take 1,400 mg by mouth daily.    . NON FORMULARY Inject 1 each into the muscle once a week. Allergy injections once weekly    . pravastatin (PRAVACHOL) 20 MG tablet Take 20 mg by mouth 2 (two) times a week.     . Turmeric 450 MG CAPS Take 450 mg by mouth daily.     . VENTOLIN HFA 108 (90 BASE) MCG/ACT inhaler Inhale 1-2 puffs into the lungs daily as needed for wheezing or shortness of breath.     . Zoledronic Acid (RECLAST IV) Inject into the vein See admin instructions. Uses every other year     No current facility-administered medications for this visit.      Past Medical History:  Diagnosis Date  . Acute  respiratory failure with hypoxia ([place])   . Asthma    "I'll have an attack if I'm around cat or dust"  . Asystole ([place])   . Atrial fibrillation ([place])   . CHB (complete heart block) ([place]) 05/2015   s/p Biotronik (serial number [id number]) dual lead pacemaker  . Depression   . Exercise-induced asthma   . Hx of colonic polyps   . Osteopenia   . PONV (postoperative nausea and vomiting)    pt. thinks it was from dilaudid  . Presence of permanent cardiac pacemaker     ROS:   All systems reviewed and negative except as noted in the HPI.   Past Surgical History:  Procedure Laterality Date  . ABDOMINAL HYSTERECTOMY N/A 07/28/2013   Procedure: HYSTERECTOMY ABDOMINAL with SALPINGO OOPHORECTOMY;  Surgeon: Lyman Speller, MD;  Location: [facility];  Service: Gynecology;  Laterality: N/A;  request 4.5 hours  . ABDOMINAL SACROCOLPOPEXY N/A 07/28/2013   Procedure: ABDOMINO SACROCOLPOPEXY;  Surgeon: Jamey Reas de Berton Lan, MD;  Location: [facility];  Service: Gynecology;  Laterality: N/A;  . APPENDECTOMY    . BLADDER SUSPENSION N/A 07/28/2013   Procedure: TRANSVAGINAL TAPE (TVT) PROCEDURE midurethral sling;  Surgeon: Jamey Reas  de Berton Lan, MD;  Location: [facility];  Service: Gynecology;  Laterality: N/A;  . CHOLECYSTECTOMY OPEN    . CYSTOSCOPY N/A 07/28/2013   Procedure: CYSTOSCOPY;  Surgeon: Jamey Reas de Berton Lan, MD;  Location: [facility];  Service: Gynecology;  Laterality: N/A;  . EP IMPLANTABLE DEVICE N/A 06/16/2015   Procedure: Pacemaker Implant;  Surgeon: Evans Lance, MD; Biotronik (serial number [id number]) pacemaker; Laterality: Left  . EP IMPLANTABLE DEVICE N/A 06/16/2015   Procedure: Loop Recorder Removal;  Surgeon: Evans Lance, MD;  Location: [place] CV LAB;  Service: Cardiovascular;  Laterality: N/A;  . FRACTURE SURGERY    . INSERT / REPLACE / REMOVE PACEMAKER  06/16/2015  . LOOP RECORDER IMPLANT N/A 10/21/2013   Procedure: LOOP RECORDER IMPLANT;   Surgeon: Evans Lance, MD;  Location: [facility] CATH LAB;  Service: Cardiovascular;  Laterality: N/A;  . LOOP RECORDER REMOVAL  06/16/2015  . TEMPORARY PACEMAKER INSERTION N/A 07/29/2013   Procedure: TEMPORARY PACEMAKER INSERTION;  Surgeon: Wellington Hampshire, MD;  Location: [place] CATH LAB;  Service: Cardiovascular;  Laterality: N/A;  . TONSILLECTOMY  1940s  . TUBAL LIGATION    . WRIST FRACTURE SURGERY Left    plate inserted     Family History  Problem Relation Age of Onset  . Varicose Veins Mother   . Heart attack Father   . Cancer Brother     colon cancer (polyps)     Social History   Social History  . Marital status: Divorced    Spouse name: N/A  . Number of children: N/A  . Years of education: N/A   Occupational History  . Not on file.   Social History Main Topics  . Smoking status: Former Smoker    Packs/day: 1.00    Years: 30.00    Types: Cigarettes    Quit date: 07/02/1988  . Smokeless tobacco: Never Used  . Alcohol use 0.6 oz/week    1 Standard drinks or equivalent per week     Comment: 06/16/2015 "probably have a couple drinks q other weekend"  . Drug use: No  . Sexual activity: Not Currently    Partners: Male    Birth control/ protection: Post-menopausal, Surgical     Comment: TAH/BSO   Other Topics Concern  . Not on file   Social History Narrative  . No narrative on file     BP 138/84   Pulse 72   Ht 5' 3.5" (1.613 m)   Wt 133 lb 6.4 oz (60.5 kg)   LMP 02/27/2000   BMI 23.26 kg/m   Physical Exam:  Well appearing 75 year old woman, NAD HEENT: Unremarkable Neck:  6 cm JVD, no thyromegally Back:  No CVA tenderness Lungs:  Clear with no wheezes HEART:  Regular rate rhythm, no murmurs, no rubs, no clicks Abd:  soft, positive bowel sounds, no organomegally, no rebound, no guarding Ext:  2 plus pulses, no edema, no cyanosis, no clubbing Skin:  No rashes no nodules Neuro:  CN II through XII intact, motor grossly intact  Device interogation - normal  biotronik DDD pm function   Assess/Plan: 1. Syncope - she was having long episodes of asystole with CHB. She has undergone insertion of a Biotronik DDD PM and has had no more syncope.  2. PAF - I have again recommended starting anti-coagulation. She has not had any atrial fib since her device was placed. She is considering her options but at this point refuses anti-coagulation. She may ultimately  require medical therapy. 3. HTN - her blood pressure is well controlled. Will follow. 4. PPM - her Biotronik PPM is working normally. Will recheck in several months.  Mikle Bosworth.D.

## 2015-09-22 NOTE — Patient Instructions (Addendum)
Medication Instructions:  Your physician recommends that you continue on your current medications as directed. Please refer to the Current Medication list given to you today.   Labwork: None ordered   Testing/Procedures: None ordered   Follow-Up: Your physician wants you to follow-up in: April 2018 with Dr Knox Saliva will receive a reminder letter in the mail two months in advance. If you don't receive a letter, please call our office to schedule the follow-up appointment.   Remote monitoring is used to monitor your Pacemaker from home. This monitoring reduces the number of office visits required to check your device to one time per year. It allows [place] to keep an eye on the functioning of your device to ensure it is working properly. You are scheduled for a device check from home on 12/22/15. You may send your transmission at any time that day. If you have a wireless device, the transmission will be sent automatically. After your physician reviews your transmission, you will receive a postcard with your next transmission date.     Any Other Special Instructions Will Be Listed Below (If Applicable).     If you need a refill on your cardiac medications before your next appointment, please call your pharmacy.

## 2015-09-26 ENCOUNTER — Encounter: Payer: Self-pay | Admitting: Internal Medicine

## 2015-09-26 DIAGNOSIS — J3089 Other allergic rhinitis: Secondary | ICD-10-CM | POA: Diagnosis not present

## 2015-09-26 DIAGNOSIS — J301 Allergic rhinitis due to pollen: Secondary | ICD-10-CM | POA: Diagnosis not present

## 2015-10-10 DIAGNOSIS — J301 Allergic rhinitis due to pollen: Secondary | ICD-10-CM | POA: Diagnosis not present

## 2015-10-10 DIAGNOSIS — J3089 Other allergic rhinitis: Secondary | ICD-10-CM | POA: Diagnosis not present

## 2015-10-17 DIAGNOSIS — J301 Allergic rhinitis due to pollen: Secondary | ICD-10-CM | POA: Diagnosis not present

## 2015-10-17 DIAGNOSIS — J3089 Other allergic rhinitis: Secondary | ICD-10-CM | POA: Diagnosis not present

## 2015-10-19 ENCOUNTER — Telehealth: Payer: Self-pay | Admitting: Obstetrics & Gynecology

## 2015-10-19 NOTE — Telephone Encounter (Signed)
Erroneous encounter.   Encounter closed.  

## 2015-10-19 NOTE — Telephone Encounter (Signed)
[facility] is requesting [place] code from [facility] 04/12/15.

## 2015-11-01 DIAGNOSIS — J301 Allergic rhinitis due to pollen: Secondary | ICD-10-CM | POA: Diagnosis not present

## 2015-11-01 DIAGNOSIS — J3089 Other allergic rhinitis: Secondary | ICD-10-CM | POA: Diagnosis not present

## 2015-11-21 DIAGNOSIS — J3089 Other allergic rhinitis: Secondary | ICD-10-CM | POA: Diagnosis not present

## 2015-11-21 DIAGNOSIS — J301 Allergic rhinitis due to pollen: Secondary | ICD-10-CM | POA: Diagnosis not present

## 2015-11-21 DIAGNOSIS — Z1231 Encounter for screening mammogram for malignant neoplasm of breast: Secondary | ICD-10-CM | POA: Diagnosis not present

## 2015-11-25 DIAGNOSIS — J301 Allergic rhinitis due to pollen: Secondary | ICD-10-CM | POA: Diagnosis not present

## 2015-11-30 DIAGNOSIS — J3089 Other allergic rhinitis: Secondary | ICD-10-CM | POA: Diagnosis not present

## 2015-11-30 DIAGNOSIS — J301 Allergic rhinitis due to pollen: Secondary | ICD-10-CM | POA: Diagnosis not present

## 2015-12-05 DIAGNOSIS — J301 Allergic rhinitis due to pollen: Secondary | ICD-10-CM | POA: Diagnosis not present

## 2015-12-05 DIAGNOSIS — J3089 Other allergic rhinitis: Secondary | ICD-10-CM | POA: Diagnosis not present

## 2015-12-10 DIAGNOSIS — Z23 Encounter for immunization: Secondary | ICD-10-CM | POA: Diagnosis not present

## 2015-12-22 ENCOUNTER — Ambulatory Visit (INDEPENDENT_AMBULATORY_CARE_PROVIDER_SITE_OTHER): Payer: Medicare HMO | Admitting: *Deleted

## 2015-12-22 DIAGNOSIS — I495 Sick sinus syndrome: Secondary | ICD-10-CM

## 2015-12-22 DIAGNOSIS — J301 Allergic rhinitis due to pollen: Secondary | ICD-10-CM | POA: Diagnosis not present

## 2015-12-22 DIAGNOSIS — J3089 Other allergic rhinitis: Secondary | ICD-10-CM | POA: Diagnosis not present

## 2015-12-22 NOTE — Progress Notes (Signed)
Remote pacemaker transmission.   

## 2015-12-30 ENCOUNTER — Encounter: Payer: Self-pay | Admitting: Cardiology

## 2016-01-09 DIAGNOSIS — J3089 Other allergic rhinitis: Secondary | ICD-10-CM | POA: Diagnosis not present

## 2016-01-09 DIAGNOSIS — J301 Allergic rhinitis due to pollen: Secondary | ICD-10-CM | POA: Diagnosis not present

## 2016-01-12 DIAGNOSIS — J301 Allergic rhinitis due to pollen: Secondary | ICD-10-CM | POA: Diagnosis not present

## 2016-01-12 DIAGNOSIS — J3089 Other allergic rhinitis: Secondary | ICD-10-CM | POA: Diagnosis not present

## 2016-01-16 DIAGNOSIS — J3089 Other allergic rhinitis: Secondary | ICD-10-CM | POA: Diagnosis not present

## 2016-01-16 DIAGNOSIS — J301 Allergic rhinitis due to pollen: Secondary | ICD-10-CM | POA: Diagnosis not present

## 2016-01-19 LAB — CUP PACEART REMOTE DEVICE CHECK
Brady Statistic RA Percent Paced: 43 %
Brady Statistic RV Percent Paced: 0 %
Date Time Interrogation Session: 20171123133948
Implantable Lead Implant Date: 20170420
Implantable Lead Implant Date: 20170420
Implantable Lead Location: 753859
Implantable Lead Location: 753860
Implantable Lead Model: 377
Implantable Lead Model: 377
Implantable Lead Serial Number: 49378160
Implantable Lead Serial Number: 49421706
Implantable Pulse Generator Implant Date: 20170420
Lead Channel Impedance Value: 449 Ohm
Lead Channel Impedance Value: 683 Ohm
Lead Channel Pacing Threshold Amplitude: 0.8 V
Lead Channel Pacing Threshold Amplitude: 0.8 V
Lead Channel Pacing Threshold Pulse Width: 0.4 ms
Lead Channel Pacing Threshold Pulse Width: 0.4 ms
Lead Channel Sensing Intrinsic Amplitude: 12.1 mV
Lead Channel Sensing Intrinsic Amplitude: 4.4 mV
Lead Channel Setting Pacing Amplitude: 2 V
Lead Channel Setting Pacing Amplitude: 2.4 V
Lead Channel Setting Pacing Pulse Width: 0.4 ms
Pulse Gen Model: 394969
Pulse Gen Serial Number: 68713503

## 2016-01-24 DIAGNOSIS — J301 Allergic rhinitis due to pollen: Secondary | ICD-10-CM | POA: Diagnosis not present

## 2016-01-24 DIAGNOSIS — J3089 Other allergic rhinitis: Secondary | ICD-10-CM | POA: Diagnosis not present

## 2016-01-30 DIAGNOSIS — J3089 Other allergic rhinitis: Secondary | ICD-10-CM | POA: Diagnosis not present

## 2016-01-30 DIAGNOSIS — J301 Allergic rhinitis due to pollen: Secondary | ICD-10-CM | POA: Diagnosis not present

## 2016-02-06 DIAGNOSIS — J301 Allergic rhinitis due to pollen: Secondary | ICD-10-CM | POA: Diagnosis not present

## 2016-02-06 DIAGNOSIS — J3089 Other allergic rhinitis: Secondary | ICD-10-CM | POA: Diagnosis not present

## 2016-02-13 DIAGNOSIS — J3089 Other allergic rhinitis: Secondary | ICD-10-CM | POA: Diagnosis not present

## 2016-02-13 DIAGNOSIS — J301 Allergic rhinitis due to pollen: Secondary | ICD-10-CM | POA: Diagnosis not present

## 2016-02-23 DIAGNOSIS — J301 Allergic rhinitis due to pollen: Secondary | ICD-10-CM | POA: Diagnosis not present

## 2016-02-23 DIAGNOSIS — J3089 Other allergic rhinitis: Secondary | ICD-10-CM | POA: Diagnosis not present

## 2016-02-28 DIAGNOSIS — E784 Other hyperlipidemia: Secondary | ICD-10-CM | POA: Diagnosis not present

## 2016-02-28 DIAGNOSIS — N390 Urinary tract infection, site not specified: Secondary | ICD-10-CM | POA: Diagnosis not present

## 2016-02-28 DIAGNOSIS — R7301 Impaired fasting glucose: Secondary | ICD-10-CM | POA: Diagnosis not present

## 2016-02-28 DIAGNOSIS — M810 Age-related osteoporosis without current pathological fracture: Secondary | ICD-10-CM | POA: Diagnosis not present

## 2016-02-28 DIAGNOSIS — R8299 Other abnormal findings in urine: Secondary | ICD-10-CM | POA: Diagnosis not present

## 2016-03-05 DIAGNOSIS — M25511 Pain in right shoulder: Secondary | ICD-10-CM | POA: Diagnosis not present

## 2016-03-05 DIAGNOSIS — Z0000 Encounter for general adult medical examination without abnormal findings: Secondary | ICD-10-CM | POA: Diagnosis not present

## 2016-03-05 DIAGNOSIS — J302 Other seasonal allergic rhinitis: Secondary | ICD-10-CM | POA: Diagnosis not present

## 2016-03-05 DIAGNOSIS — M810 Age-related osteoporosis without current pathological fracture: Secondary | ICD-10-CM | POA: Diagnosis not present

## 2016-03-05 DIAGNOSIS — M79672 Pain in left foot: Secondary | ICD-10-CM | POA: Diagnosis not present

## 2016-03-05 DIAGNOSIS — Z1231 Encounter for screening mammogram for malignant neoplasm of breast: Secondary | ICD-10-CM | POA: Diagnosis not present

## 2016-03-05 DIAGNOSIS — E784 Other hyperlipidemia: Secondary | ICD-10-CM | POA: Diagnosis not present

## 2016-03-05 DIAGNOSIS — I480 Paroxysmal atrial fibrillation: Secondary | ICD-10-CM | POA: Diagnosis not present

## 2016-03-05 DIAGNOSIS — R69 Illness, unspecified: Secondary | ICD-10-CM | POA: Diagnosis not present

## 2016-03-05 DIAGNOSIS — R7301 Impaired fasting glucose: Secondary | ICD-10-CM | POA: Diagnosis not present

## 2016-03-06 DIAGNOSIS — J3089 Other allergic rhinitis: Secondary | ICD-10-CM | POA: Diagnosis not present

## 2016-03-06 DIAGNOSIS — J301 Allergic rhinitis due to pollen: Secondary | ICD-10-CM | POA: Diagnosis not present

## 2016-03-20 DIAGNOSIS — J3089 Other allergic rhinitis: Secondary | ICD-10-CM | POA: Diagnosis not present

## 2016-03-20 DIAGNOSIS — J301 Allergic rhinitis due to pollen: Secondary | ICD-10-CM | POA: Diagnosis not present

## 2016-03-22 ENCOUNTER — Ambulatory Visit (INDEPENDENT_AMBULATORY_CARE_PROVIDER_SITE_OTHER): Payer: Medicare HMO | Admitting: *Deleted

## 2016-03-22 DIAGNOSIS — I495 Sick sinus syndrome: Secondary | ICD-10-CM

## 2016-03-22 DIAGNOSIS — R69 Illness, unspecified: Secondary | ICD-10-CM | POA: Diagnosis not present

## 2016-03-22 NOTE — Progress Notes (Signed)
Remote pacemaker transmission.   

## 2016-03-23 ENCOUNTER — Encounter: Payer: Self-pay | Admitting: Cardiology

## 2016-03-23 DIAGNOSIS — J3081 Allergic rhinitis due to animal (cat) (dog) hair and dander: Secondary | ICD-10-CM | POA: Diagnosis not present

## 2016-03-23 DIAGNOSIS — R05 Cough: Secondary | ICD-10-CM | POA: Diagnosis not present

## 2016-03-23 DIAGNOSIS — J301 Allergic rhinitis due to pollen: Secondary | ICD-10-CM | POA: Diagnosis not present

## 2016-03-23 DIAGNOSIS — J4520 Mild intermittent asthma, uncomplicated: Secondary | ICD-10-CM | POA: Diagnosis not present

## 2016-03-26 DIAGNOSIS — H5203 Hypermetropia, bilateral: Secondary | ICD-10-CM | POA: Diagnosis not present

## 2016-03-26 DIAGNOSIS — H25813 Combined forms of age-related cataract, bilateral: Secondary | ICD-10-CM | POA: Diagnosis not present

## 2016-03-27 LAB — CUP PACEART REMOTE DEVICE CHECK
Brady Statistic RA Percent Paced: 41 %
Brady Statistic RV Percent Paced: 0 %
Date Time Interrogation Session: 20180130095849
Implantable Lead Implant Date: 20170420
Implantable Lead Implant Date: 20170420
Implantable Lead Location: 753859
Implantable Lead Location: 753860
Implantable Lead Model: 377
Implantable Lead Model: 377
Implantable Lead Serial Number: 49378160
Implantable Lead Serial Number: 49421706
Implantable Pulse Generator Implant Date: 20170420
Lead Channel Impedance Value: 468 Ohm
Lead Channel Impedance Value: 663 Ohm
Lead Channel Pacing Threshold Amplitude: 0.8 V
Lead Channel Pacing Threshold Amplitude: 0.8 V
Lead Channel Pacing Threshold Pulse Width: 0.4 ms
Lead Channel Pacing Threshold Pulse Width: 0.4 ms
Lead Channel Sensing Intrinsic Amplitude: 12.6 mV
Lead Channel Sensing Intrinsic Amplitude: 5 mV
Lead Channel Setting Pacing Amplitude: 2 V
Lead Channel Setting Pacing Amplitude: 2.4 V
Lead Channel Setting Pacing Pulse Width: 0.4 ms
Pulse Gen Model: 394969
Pulse Gen Serial Number: 68713503

## 2016-03-30 DIAGNOSIS — J301 Allergic rhinitis due to pollen: Secondary | ICD-10-CM | POA: Diagnosis not present

## 2016-03-30 DIAGNOSIS — J3089 Other allergic rhinitis: Secondary | ICD-10-CM | POA: Diagnosis not present

## 2016-04-03 DIAGNOSIS — Z1212 Encounter for screening for malignant neoplasm of rectum: Secondary | ICD-10-CM | POA: Diagnosis not present

## 2016-04-06 ENCOUNTER — Encounter: Payer: Self-pay | Admitting: Cardiology

## 2016-04-09 DIAGNOSIS — J301 Allergic rhinitis due to pollen: Secondary | ICD-10-CM | POA: Diagnosis not present

## 2016-04-09 DIAGNOSIS — J3089 Other allergic rhinitis: Secondary | ICD-10-CM | POA: Diagnosis not present

## 2016-04-17 DIAGNOSIS — J301 Allergic rhinitis due to pollen: Secondary | ICD-10-CM | POA: Diagnosis not present

## 2016-04-17 DIAGNOSIS — J3089 Other allergic rhinitis: Secondary | ICD-10-CM | POA: Diagnosis not present

## 2016-04-23 DIAGNOSIS — J3089 Other allergic rhinitis: Secondary | ICD-10-CM | POA: Diagnosis not present

## 2016-04-23 DIAGNOSIS — J301 Allergic rhinitis due to pollen: Secondary | ICD-10-CM | POA: Diagnosis not present

## 2016-05-10 DIAGNOSIS — J3089 Other allergic rhinitis: Secondary | ICD-10-CM | POA: Diagnosis not present

## 2016-05-10 DIAGNOSIS — J301 Allergic rhinitis due to pollen: Secondary | ICD-10-CM | POA: Diagnosis not present

## 2016-05-19 DIAGNOSIS — N3001 Acute cystitis with hematuria: Secondary | ICD-10-CM | POA: Diagnosis not present

## 2016-05-24 DIAGNOSIS — J3089 Other allergic rhinitis: Secondary | ICD-10-CM | POA: Diagnosis not present

## 2016-05-24 DIAGNOSIS — J301 Allergic rhinitis due to pollen: Secondary | ICD-10-CM | POA: Diagnosis not present

## 2016-05-31 ENCOUNTER — Encounter: Payer: Self-pay | Admitting: Internal Medicine

## 2016-06-04 DIAGNOSIS — J301 Allergic rhinitis due to pollen: Secondary | ICD-10-CM | POA: Diagnosis not present

## 2016-06-04 DIAGNOSIS — J3089 Other allergic rhinitis: Secondary | ICD-10-CM | POA: Diagnosis not present

## 2016-06-06 DIAGNOSIS — H66001 Acute suppurative otitis media without spontaneous rupture of ear drum, right ear: Secondary | ICD-10-CM | POA: Diagnosis not present

## 2016-06-06 DIAGNOSIS — H6121 Impacted cerumen, right ear: Secondary | ICD-10-CM | POA: Diagnosis not present

## 2016-06-06 DIAGNOSIS — R42 Dizziness and giddiness: Secondary | ICD-10-CM | POA: Diagnosis not present

## 2016-06-18 ENCOUNTER — Ambulatory Visit (INDEPENDENT_AMBULATORY_CARE_PROVIDER_SITE_OTHER): Payer: Medicare HMO | Admitting: Internal Medicine

## 2016-06-18 ENCOUNTER — Encounter: Payer: Self-pay | Admitting: Internal Medicine

## 2016-06-18 VITALS — BP 124/72 | HR 74 | Ht 63.5 in

## 2016-06-18 DIAGNOSIS — I495 Sick sinus syndrome: Secondary | ICD-10-CM | POA: Diagnosis not present

## 2016-06-18 DIAGNOSIS — I480 Paroxysmal atrial fibrillation: Secondary | ICD-10-CM | POA: Diagnosis not present

## 2016-06-18 DIAGNOSIS — J3089 Other allergic rhinitis: Secondary | ICD-10-CM | POA: Diagnosis not present

## 2016-06-18 DIAGNOSIS — J301 Allergic rhinitis due to pollen: Secondary | ICD-10-CM | POA: Diagnosis not present

## 2016-06-18 NOTE — Patient Instructions (Signed)
Medication Instructions:  Your physician recommends that you continue on your current medications as directed. Please refer to the Current Medication list given to you today.   Labwork: None Ordered   Testing/Procedures: None Ordered   Follow-Up: Remote monitoring is used to monitor your Pacemaker from home. This monitoring reduces the number of office visits required to check your device to one time per year. It allows [place] to keep an eye on the functioning of your device to ensure it is working properly. You are scheduled for a device check from home on 09/17/16. You may send your transmission at any time that day. If you have a wireless device, the transmission will be sent automatically. After your physician reviews your transmission, you will receive a postcard with your next transmission date.   Your physician wants you to follow-up in: 1 year with Dr. Lovena Le.  You will receive a reminder letter in the mail two months in advance. If you don't receive a letter, please call our office to schedule the follow-up appointment.   If you need a refill on your cardiac medications before your next appointment, please call your pharmacy.   Thank you for choosing [facility]! Christen Bame, RN [phone]

## 2016-06-18 NOTE — Progress Notes (Signed)
HPI Danielle Lucas returns today for ongoing evaluation and management of atrial fibrillation and syncope. When I last saw her I had recommended initiating systemic anti-coagulation but she refused. In the interim, she had a 10 second pause and underwent insertion of a PPM. Since then she has done well and denies chest pain or sob. No palpitations.  Allergies  Allergen Reactions  . Other Anaphylaxis    Cat dander  . Codeine Nausea And Vomiting  . Dilaudid [Hydromorphone Hcl] Nausea And Vomiting  . Morphine And Related Nausea And Vomiting     Current Outpatient Prescriptions  Medication Sig Dispense Refill  . aspirin EC 81 MG tablet Take 81 mg by mouth daily.    . B Complex Vitamins (VITAMIN-B COMPLEX PO) Take 1 capsule by mouth daily.     . Biotin 1 MG CAPS Take 1 mg by mouth daily.     . Calcium-Vitamin D-Vitamin K (VIACTIV) 557-322-02 MG-UNT-MCG CHEW Chew 1 capsule by mouth 2 (two) times daily.     . Cholecalciferol (VITAMIN D) 2000 UNITS CAPS Take 2,000 Units by mouth daily.     . Coenzyme Q10 (CO Q 10) 10 MG CAPS Take 10 mg by mouth daily.     Marland Kitchen EPIPEN 2-PAK 0.3 MG/0.3ML SOAJ injection Inject 0.3 mg into the muscle as directed. Reported on 04/12/2015    . escitalopram (LEXAPRO) 10 MG tablet Take 5 mg by mouth daily.    . Flaxseed, Linseed, (FLAXSEED OIL PO) Take 1,400 mg by mouth daily.    . NON FORMULARY Inject 1 each into the muscle once a week. Allergy injections once weekly    . pravastatin (PRAVACHOL) 20 MG tablet Take 20 mg by mouth 2 (two) times a week.     . Turmeric 450 MG CAPS Take 450 mg by mouth daily.     . VENTOLIN HFA 108 (90 BASE) MCG/ACT inhaler Inhale 1-2 puffs into the lungs daily as needed for wheezing or shortness of breath.     . Zoledronic Acid (RECLAST IV) Inject into the vein See admin instructions. Uses every other year     No current facility-administered medications for this visit.      Past Medical History:  Diagnosis Date  . Acute  respiratory failure with hypoxia ([place])   . Asthma    "I'll have an attack if I'm around cat or dust"  . Asystole ([place])   . Atrial fibrillation ([place])   . CHB (complete heart block) ([place]) 05/2015   s/p Biotronik (serial number [id number]) dual lead pacemaker  . Depression   . Exercise-induced asthma   . Hx of colonic polyps   . Osteopenia   . PONV (postoperative nausea and vomiting)    pt. thinks it was from dilaudid  . Presence of permanent cardiac pacemaker     ROS:   All systems reviewed and negative except as noted in the HPI.   Past Surgical History:  Procedure Laterality Date  . ABDOMINAL HYSTERECTOMY N/A 07/28/2013   Procedure: HYSTERECTOMY ABDOMINAL with SALPINGO OOPHORECTOMY;  Surgeon: Danielle Speller, MD;  Location: [facility];  Service: Gynecology;  Laterality: N/A;  request 4.5 hours  . ABDOMINAL SACROCOLPOPEXY N/A 07/28/2013   Procedure: ABDOMINO SACROCOLPOPEXY;  Surgeon: Danielle Reas de Berton Lan, MD;  Location: [facility];  Service: Gynecology;  Laterality: N/A;  . APPENDECTOMY    . BLADDER SUSPENSION N/A 07/28/2013   Procedure: TRANSVAGINAL TAPE (TVT) PROCEDURE midurethral sling;  Surgeon: Danielle Reas  de Berton Lan, MD;  Location: [facility];  Service: Gynecology;  Laterality: N/A;  . CHOLECYSTECTOMY OPEN    . CYSTOSCOPY N/A 07/28/2013   Procedure: CYSTOSCOPY;  Surgeon: Danielle Reas de Berton Lan, MD;  Location: [facility];  Service: Gynecology;  Laterality: N/A;  . EP IMPLANTABLE DEVICE N/A 06/16/2015   Procedure: Pacemaker Implant;  Surgeon: Danielle Lance, MD; Biotronik (serial number [id number]) pacemaker; Laterality: Left  . EP IMPLANTABLE DEVICE N/A 06/16/2015   Procedure: Loop Recorder Removal;  Surgeon: Danielle Lance, MD;  Location: [facility];  Service: Cardiovascular;  Laterality: N/A;  . FRACTURE SURGERY    . INSERT / REPLACE / REMOVE PACEMAKER  06/16/2015  . LOOP RECORDER IMPLANT N/A 10/21/2013   Procedure: LOOP RECORDER IMPLANT;   Surgeon: Danielle Lance, MD;  [facility];  Service: Cardiovascular;  Laterality: N/A;  . LOOP RECORDER REMOVAL  06/16/2015  . TEMPORARY PACEMAKER INSERTION N/A 07/29/2013   Procedure: TEMPORARY PACEMAKER INSERTION;  Surgeon: Danielle Hampshire, MD;  Location: [facility];  Service: Cardiovascular;  Laterality: N/A;  . TONSILLECTOMY  1940s  . TUBAL LIGATION    . WRIST FRACTURE SURGERY Left    plate inserted     Family History  Problem Relation Age of Onset  . Varicose Veins Mother   . Heart attack Father   . Cancer Brother     colon cancer (polyps)     Social History   Social History  . Marital status: Divorced    Spouse name: N/A  . Number of children: N/A  . Years of education: N/A   Occupational History  . Not on file.   Social History Main Topics  . Smoking status: Former Smoker    Packs/day: 1.00    Years: 30.00    Types: Cigarettes    Quit date: 07/02/1988  . Smokeless tobacco: Never Used  . Alcohol use 0.6 oz/week    1 Standard drinks or equivalent per week     Comment: 06/16/2015 "probably have a couple drinks q other weekend"  . Drug use: No  . Sexual activity: Not Currently    Partners: Male    Birth control/ protection: Post-menopausal, Surgical     Comment: TAH/BSO   Other Topics Concern  . Not on file   Social History Narrative  . No narrative on file     BP 124/72   Pulse 74   Ht 5' 3.5" (1.613 m)   LMP 02/27/2000   SpO2 96%   Physical Exam:  Well appearing 76 year old woman, NAD HEENT: Unremarkable Neck:  6 cm JVD, no thyromegally Back:  No CVA tenderness Lungs:  Clear with no wheezes HEART:  Regular rate rhythm, no murmurs, no rubs, no clicks Abd:  soft, positive bowel sounds, no organomegally, no rebound, no guarding Ext:  2 plus pulses, no edema, no cyanosis, no clubbing Skin:  No rashes no nodules Neuro:  CN II through XII intact, motor grossly intact  Device interogation - normal biotronik DDD pm  function   Assess/Plan: 1. Syncope - she was having long episodes of asystole with CHB. She has undergone insertion of a Biotronik DDD PM and has had no more syncope.  2. PAF - I have again recommended starting anti-coagulation. She has not had any atrial fib since her device was placed. She is considering her options but at this point refuses anti-coagulation. She may ultimately require medical therapy. 3. HTN - her blood pressure is  well controlled. Will follow. 4. PPM - her Biotronik PPM is working normally. Will recheck in several months.  Mikle Bosworth.D.

## 2016-06-20 DIAGNOSIS — J3089 Other allergic rhinitis: Secondary | ICD-10-CM | POA: Diagnosis not present

## 2016-06-21 LAB — CUP PACEART INCLINIC DEVICE CHECK
Brady Statistic RA Percent Paced: 40 %
Date Time Interrogation Session: 20180423180300
Implantable Lead Implant Date: 20170420
Implantable Lead Implant Date: 20170420
Implantable Lead Location: 753859
Implantable Lead Location: 753860
Implantable Lead Model: 377
Implantable Lead Model: 377
Implantable Lead Serial Number: 49378160
Implantable Lead Serial Number: 49421706
Implantable Pulse Generator Implant Date: 20170420
Lead Channel Impedance Value: 487 Ohm
Lead Channel Impedance Value: 682 Ohm
Lead Channel Pacing Threshold Amplitude: 0.8 V
Lead Channel Pacing Threshold Amplitude: 0.8 V
Lead Channel Pacing Threshold Pulse Width: 0.4 ms
Lead Channel Pacing Threshold Pulse Width: 0.4 ms
Lead Channel Sensing Intrinsic Amplitude: 14.8 mV
Lead Channel Sensing Intrinsic Amplitude: 5.9 mV
Lead Channel Setting Pacing Amplitude: 2 V
Lead Channel Setting Pacing Amplitude: 2.4 V
Lead Channel Setting Pacing Pulse Width: 0.4 ms
Pulse Gen Model: 394969
Pulse Gen Serial Number: 68713503

## 2016-07-05 DIAGNOSIS — J3089 Other allergic rhinitis: Secondary | ICD-10-CM | POA: Diagnosis not present

## 2016-07-05 DIAGNOSIS — J301 Allergic rhinitis due to pollen: Secondary | ICD-10-CM | POA: Diagnosis not present

## 2016-07-24 DIAGNOSIS — J3089 Other allergic rhinitis: Secondary | ICD-10-CM | POA: Diagnosis not present

## 2016-07-24 DIAGNOSIS — J301 Allergic rhinitis due to pollen: Secondary | ICD-10-CM | POA: Diagnosis not present

## 2016-08-01 DIAGNOSIS — J301 Allergic rhinitis due to pollen: Secondary | ICD-10-CM | POA: Diagnosis not present

## 2016-08-01 DIAGNOSIS — J3089 Other allergic rhinitis: Secondary | ICD-10-CM | POA: Diagnosis not present

## 2016-08-06 DIAGNOSIS — N390 Urinary tract infection, site not specified: Secondary | ICD-10-CM | POA: Diagnosis not present

## 2016-08-09 DIAGNOSIS — L814 Other melanin hyperpigmentation: Secondary | ICD-10-CM | POA: Diagnosis not present

## 2016-08-09 DIAGNOSIS — L918 Other hypertrophic disorders of the skin: Secondary | ICD-10-CM | POA: Diagnosis not present

## 2016-08-09 DIAGNOSIS — J301 Allergic rhinitis due to pollen: Secondary | ICD-10-CM | POA: Diagnosis not present

## 2016-08-09 DIAGNOSIS — J3089 Other allergic rhinitis: Secondary | ICD-10-CM | POA: Diagnosis not present

## 2016-08-09 DIAGNOSIS — D1801 Hemangioma of skin and subcutaneous tissue: Secondary | ICD-10-CM | POA: Diagnosis not present

## 2016-08-09 DIAGNOSIS — L720 Epidermal cyst: Secondary | ICD-10-CM | POA: Diagnosis not present

## 2016-08-09 DIAGNOSIS — L700 Acne vulgaris: Secondary | ICD-10-CM | POA: Diagnosis not present

## 2016-08-09 DIAGNOSIS — D2372 Other benign neoplasm of skin of left lower limb, including hip: Secondary | ICD-10-CM | POA: Diagnosis not present

## 2016-08-09 DIAGNOSIS — L84 Corns and callosities: Secondary | ICD-10-CM | POA: Diagnosis not present

## 2016-08-09 DIAGNOSIS — L821 Other seborrheic keratosis: Secondary | ICD-10-CM | POA: Diagnosis not present

## 2016-08-16 DIAGNOSIS — J3089 Other allergic rhinitis: Secondary | ICD-10-CM | POA: Diagnosis not present

## 2016-08-16 DIAGNOSIS — J301 Allergic rhinitis due to pollen: Secondary | ICD-10-CM | POA: Diagnosis not present

## 2016-08-21 DIAGNOSIS — J301 Allergic rhinitis due to pollen: Secondary | ICD-10-CM | POA: Diagnosis not present

## 2016-09-03 DIAGNOSIS — J301 Allergic rhinitis due to pollen: Secondary | ICD-10-CM | POA: Diagnosis not present

## 2016-09-03 DIAGNOSIS — J3089 Other allergic rhinitis: Secondary | ICD-10-CM | POA: Diagnosis not present

## 2016-09-06 DIAGNOSIS — K228 Other specified diseases of esophagus: Secondary | ICD-10-CM | POA: Diagnosis not present

## 2016-09-06 DIAGNOSIS — E7800 Pure hypercholesterolemia, unspecified: Secondary | ICD-10-CM | POA: Diagnosis not present

## 2016-09-06 DIAGNOSIS — R69 Illness, unspecified: Secondary | ICD-10-CM | POA: Diagnosis not present

## 2016-09-06 DIAGNOSIS — G4709 Other insomnia: Secondary | ICD-10-CM | POA: Diagnosis not present

## 2016-09-06 DIAGNOSIS — I480 Paroxysmal atrial fibrillation: Secondary | ICD-10-CM | POA: Diagnosis not present

## 2016-09-06 DIAGNOSIS — Z0000 Encounter for general adult medical examination without abnormal findings: Secondary | ICD-10-CM | POA: Diagnosis not present

## 2016-09-06 DIAGNOSIS — H9110 Presbycusis, unspecified ear: Secondary | ICD-10-CM | POA: Diagnosis not present

## 2016-09-06 DIAGNOSIS — N329 Bladder disorder, unspecified: Secondary | ICD-10-CM | POA: Diagnosis not present

## 2016-09-06 DIAGNOSIS — H259 Unspecified age-related cataract: Secondary | ICD-10-CM | POA: Diagnosis not present

## 2016-09-06 DIAGNOSIS — M545 Low back pain: Secondary | ICD-10-CM | POA: Diagnosis not present

## 2016-09-10 DIAGNOSIS — J3089 Other allergic rhinitis: Secondary | ICD-10-CM | POA: Diagnosis not present

## 2016-09-10 DIAGNOSIS — J301 Allergic rhinitis due to pollen: Secondary | ICD-10-CM | POA: Diagnosis not present

## 2016-09-13 DIAGNOSIS — J3089 Other allergic rhinitis: Secondary | ICD-10-CM | POA: Diagnosis not present

## 2016-09-13 DIAGNOSIS — J301 Allergic rhinitis due to pollen: Secondary | ICD-10-CM | POA: Diagnosis not present

## 2016-09-17 ENCOUNTER — Ambulatory Visit (INDEPENDENT_AMBULATORY_CARE_PROVIDER_SITE_OTHER): Payer: Medicare HMO | Admitting: *Deleted

## 2016-09-17 DIAGNOSIS — I495 Sick sinus syndrome: Secondary | ICD-10-CM

## 2016-09-17 DIAGNOSIS — J3089 Other allergic rhinitis: Secondary | ICD-10-CM | POA: Diagnosis not present

## 2016-09-17 DIAGNOSIS — J301 Allergic rhinitis due to pollen: Secondary | ICD-10-CM | POA: Diagnosis not present

## 2016-09-19 DIAGNOSIS — J301 Allergic rhinitis due to pollen: Secondary | ICD-10-CM | POA: Diagnosis not present

## 2016-09-19 NOTE — Progress Notes (Signed)
Remote pacemaker transmission.   

## 2016-09-20 ENCOUNTER — Encounter: Payer: Self-pay | Admitting: Cardiology

## 2016-09-25 DIAGNOSIS — H9193 Unspecified hearing loss, bilateral: Secondary | ICD-10-CM | POA: Diagnosis not present

## 2016-09-25 DIAGNOSIS — H9313 Tinnitus, bilateral: Secondary | ICD-10-CM | POA: Diagnosis not present

## 2016-09-25 DIAGNOSIS — R69 Illness, unspecified: Secondary | ICD-10-CM | POA: Diagnosis not present

## 2016-09-25 DIAGNOSIS — H9190 Unspecified hearing loss, unspecified ear: Secondary | ICD-10-CM | POA: Insufficient documentation

## 2016-09-25 DIAGNOSIS — R42 Dizziness and giddiness: Secondary | ICD-10-CM | POA: Diagnosis not present

## 2016-09-25 DIAGNOSIS — J3089 Other allergic rhinitis: Secondary | ICD-10-CM | POA: Diagnosis not present

## 2016-09-25 DIAGNOSIS — F458 Other somatoform disorders: Secondary | ICD-10-CM | POA: Insufficient documentation

## 2016-09-25 DIAGNOSIS — R7301 Impaired fasting glucose: Secondary | ICD-10-CM | POA: Diagnosis not present

## 2016-09-25 DIAGNOSIS — Z950 Presence of cardiac pacemaker: Secondary | ICD-10-CM | POA: Diagnosis not present

## 2016-09-25 DIAGNOSIS — Z6823 Body mass index (BMI) 23.0-23.9, adult: Secondary | ICD-10-CM | POA: Diagnosis not present

## 2016-09-25 DIAGNOSIS — J301 Allergic rhinitis due to pollen: Secondary | ICD-10-CM | POA: Diagnosis not present

## 2016-09-27 DIAGNOSIS — R69 Illness, unspecified: Secondary | ICD-10-CM | POA: Diagnosis not present

## 2016-10-04 DIAGNOSIS — J3089 Other allergic rhinitis: Secondary | ICD-10-CM | POA: Diagnosis not present

## 2016-10-04 DIAGNOSIS — J301 Allergic rhinitis due to pollen: Secondary | ICD-10-CM | POA: Diagnosis not present

## 2016-10-11 DIAGNOSIS — H903 Sensorineural hearing loss, bilateral: Secondary | ICD-10-CM | POA: Diagnosis not present

## 2016-10-16 LAB — CUP PACEART REMOTE DEVICE CHECK
Date Time Interrogation Session: 20180821094610
Implantable Lead Implant Date: 20170420
Implantable Lead Implant Date: 20170420
Implantable Lead Location: 753859
Implantable Lead Location: 753860
Implantable Lead Model: 377
Implantable Lead Model: 377
Implantable Lead Serial Number: 49378160
Implantable Lead Serial Number: 49421706
Implantable Pulse Generator Implant Date: 20170420
Lead Channel Setting Pacing Amplitude: 2 V
Lead Channel Setting Pacing Amplitude: 2.4 V
Lead Channel Setting Pacing Pulse Width: 0.4 ms
Pulse Gen Model: 394969
Pulse Gen Serial Number: 68713503

## 2016-10-22 DIAGNOSIS — J3089 Other allergic rhinitis: Secondary | ICD-10-CM | POA: Diagnosis not present

## 2016-10-22 DIAGNOSIS — J301 Allergic rhinitis due to pollen: Secondary | ICD-10-CM | POA: Diagnosis not present

## 2016-10-30 DIAGNOSIS — J3089 Other allergic rhinitis: Secondary | ICD-10-CM | POA: Diagnosis not present

## 2016-10-30 DIAGNOSIS — J301 Allergic rhinitis due to pollen: Secondary | ICD-10-CM | POA: Diagnosis not present

## 2016-11-12 DIAGNOSIS — M810 Age-related osteoporosis without current pathological fracture: Secondary | ICD-10-CM | POA: Diagnosis not present

## 2016-11-12 DIAGNOSIS — J3089 Other allergic rhinitis: Secondary | ICD-10-CM | POA: Diagnosis not present

## 2016-11-12 DIAGNOSIS — J301 Allergic rhinitis due to pollen: Secondary | ICD-10-CM | POA: Diagnosis not present

## 2016-11-21 DIAGNOSIS — J301 Allergic rhinitis due to pollen: Secondary | ICD-10-CM | POA: Diagnosis not present

## 2016-11-21 DIAGNOSIS — J3089 Other allergic rhinitis: Secondary | ICD-10-CM | POA: Diagnosis not present

## 2016-12-03 DIAGNOSIS — J3089 Other allergic rhinitis: Secondary | ICD-10-CM | POA: Diagnosis not present

## 2016-12-03 DIAGNOSIS — J301 Allergic rhinitis due to pollen: Secondary | ICD-10-CM | POA: Diagnosis not present

## 2016-12-03 DIAGNOSIS — Z1231 Encounter for screening mammogram for malignant neoplasm of breast: Secondary | ICD-10-CM | POA: Diagnosis not present

## 2016-12-14 DIAGNOSIS — M9903 Segmental and somatic dysfunction of lumbar region: Secondary | ICD-10-CM | POA: Diagnosis not present

## 2016-12-14 DIAGNOSIS — M9901 Segmental and somatic dysfunction of cervical region: Secondary | ICD-10-CM | POA: Diagnosis not present

## 2016-12-14 DIAGNOSIS — M9902 Segmental and somatic dysfunction of thoracic region: Secondary | ICD-10-CM | POA: Diagnosis not present

## 2016-12-14 DIAGNOSIS — M5030 Other cervical disc degeneration, unspecified cervical region: Secondary | ICD-10-CM | POA: Diagnosis not present

## 2016-12-14 DIAGNOSIS — M5136 Other intervertebral disc degeneration, lumbar region: Secondary | ICD-10-CM | POA: Diagnosis not present

## 2016-12-14 DIAGNOSIS — M9904 Segmental and somatic dysfunction of sacral region: Secondary | ICD-10-CM | POA: Diagnosis not present

## 2016-12-17 ENCOUNTER — Ambulatory Visit (INDEPENDENT_AMBULATORY_CARE_PROVIDER_SITE_OTHER): Payer: Medicare HMO | Admitting: *Deleted

## 2016-12-17 ENCOUNTER — Telehealth: Payer: Self-pay | Admitting: Cardiology

## 2016-12-17 DIAGNOSIS — I495 Sick sinus syndrome: Secondary | ICD-10-CM | POA: Diagnosis not present

## 2016-12-17 DIAGNOSIS — M9904 Segmental and somatic dysfunction of sacral region: Secondary | ICD-10-CM | POA: Diagnosis not present

## 2016-12-17 DIAGNOSIS — M5030 Other cervical disc degeneration, unspecified cervical region: Secondary | ICD-10-CM | POA: Diagnosis not present

## 2016-12-17 DIAGNOSIS — M9902 Segmental and somatic dysfunction of thoracic region: Secondary | ICD-10-CM | POA: Diagnosis not present

## 2016-12-17 DIAGNOSIS — M9901 Segmental and somatic dysfunction of cervical region: Secondary | ICD-10-CM | POA: Diagnosis not present

## 2016-12-17 DIAGNOSIS — M9903 Segmental and somatic dysfunction of lumbar region: Secondary | ICD-10-CM | POA: Diagnosis not present

## 2016-12-17 DIAGNOSIS — M5136 Other intervertebral disc degeneration, lumbar region: Secondary | ICD-10-CM | POA: Diagnosis not present

## 2016-12-17 DIAGNOSIS — J301 Allergic rhinitis due to pollen: Secondary | ICD-10-CM | POA: Diagnosis not present

## 2016-12-17 DIAGNOSIS — J3089 Other allergic rhinitis: Secondary | ICD-10-CM | POA: Diagnosis not present

## 2016-12-17 NOTE — Telephone Encounter (Signed)
LMOVM reminding pt to send remote transmission.   

## 2016-12-19 DIAGNOSIS — M9904 Segmental and somatic dysfunction of sacral region: Secondary | ICD-10-CM | POA: Diagnosis not present

## 2016-12-19 DIAGNOSIS — M9903 Segmental and somatic dysfunction of lumbar region: Secondary | ICD-10-CM | POA: Diagnosis not present

## 2016-12-19 DIAGNOSIS — M5030 Other cervical disc degeneration, unspecified cervical region: Secondary | ICD-10-CM | POA: Diagnosis not present

## 2016-12-19 DIAGNOSIS — M9902 Segmental and somatic dysfunction of thoracic region: Secondary | ICD-10-CM | POA: Diagnosis not present

## 2016-12-19 DIAGNOSIS — M9901 Segmental and somatic dysfunction of cervical region: Secondary | ICD-10-CM | POA: Diagnosis not present

## 2016-12-19 DIAGNOSIS — M5136 Other intervertebral disc degeneration, lumbar region: Secondary | ICD-10-CM | POA: Diagnosis not present

## 2016-12-19 NOTE — Progress Notes (Signed)
Remote pacemaker transmission.   

## 2016-12-20 LAB — CUP PACEART REMOTE DEVICE CHECK
Date Time Interrogation Session: 20181025080702
Implantable Lead Implant Date: 20170420
Implantable Lead Implant Date: 20170420
Implantable Lead Location: 753859
Implantable Lead Location: 753860
Implantable Lead Model: 377
Implantable Lead Model: 377
Implantable Lead Serial Number: 49378160
Implantable Lead Serial Number: 49421706
Implantable Pulse Generator Implant Date: 20170420
Pulse Gen Model: 394969
Pulse Gen Serial Number: 68713503

## 2016-12-21 ENCOUNTER — Encounter: Payer: Self-pay | Admitting: Cardiology

## 2016-12-21 DIAGNOSIS — R69 Illness, unspecified: Secondary | ICD-10-CM | POA: Diagnosis not present

## 2016-12-21 DIAGNOSIS — M5030 Other cervical disc degeneration, unspecified cervical region: Secondary | ICD-10-CM | POA: Diagnosis not present

## 2016-12-21 DIAGNOSIS — M5136 Other intervertebral disc degeneration, lumbar region: Secondary | ICD-10-CM | POA: Diagnosis not present

## 2016-12-21 DIAGNOSIS — M9903 Segmental and somatic dysfunction of lumbar region: Secondary | ICD-10-CM | POA: Diagnosis not present

## 2016-12-21 DIAGNOSIS — M9904 Segmental and somatic dysfunction of sacral region: Secondary | ICD-10-CM | POA: Diagnosis not present

## 2016-12-21 DIAGNOSIS — M9902 Segmental and somatic dysfunction of thoracic region: Secondary | ICD-10-CM | POA: Diagnosis not present

## 2016-12-21 DIAGNOSIS — M9901 Segmental and somatic dysfunction of cervical region: Secondary | ICD-10-CM | POA: Diagnosis not present

## 2016-12-26 ENCOUNTER — Encounter: Payer: Self-pay | Admitting: Internal Medicine

## 2017-01-02 DIAGNOSIS — M5430 Sciatica, unspecified side: Secondary | ICD-10-CM | POA: Insufficient documentation

## 2017-01-03 ENCOUNTER — Ambulatory Visit (INDEPENDENT_AMBULATORY_CARE_PROVIDER_SITE_OTHER): Payer: Medicare HMO | Admitting: Obstetrics and Gynecology

## 2017-01-03 ENCOUNTER — Encounter: Payer: Self-pay | Admitting: Obstetrics and Gynecology

## 2017-01-03 ENCOUNTER — Other Ambulatory Visit: Payer: Self-pay

## 2017-01-03 VITALS — BP 140/80 | HR 80 | Ht 63.0 in | Wt 138.0 lb

## 2017-01-03 DIAGNOSIS — R350 Frequency of micturition: Secondary | ICD-10-CM | POA: Diagnosis not present

## 2017-01-03 DIAGNOSIS — R8290 Unspecified abnormal findings in urine: Secondary | ICD-10-CM | POA: Diagnosis not present

## 2017-01-03 LAB — POCT URINALYSIS DIPSTICK
Bilirubin, UA: NEGATIVE
Glucose, UA: NEGATIVE
Ketones, UA: NEGATIVE
Nitrite, UA: NEGATIVE
Protein, UA: NEGATIVE
Urobilinogen, UA: 0.2 E.U./dL
pH, UA: 5 (ref 5.0–8.0)

## 2017-01-03 MED ORDER — ESTROGENS, CONJUGATED 0.625 MG/GM VA CREA: 1.0000 | TOPICAL_CREAM | Freq: Every day | VAGINAL | 0 refills | Status: DC

## 2017-01-03 MED ORDER — PHENAZOPYRIDINE HCL 100 MG PO TABS: 100.0000 mg | ORAL_TABLET | Freq: Three times a day (TID) | ORAL | 0 refills | Status: DC | PRN

## 2017-01-03 MED ORDER — SULFAMETHOXAZOLE-TRIMETHOPRIM 800-160 MG PO TABS: 1.0000 | ORAL_TABLET | Freq: Two times a day (BID) | ORAL | 0 refills | Status: DC

## 2017-01-03 NOTE — Patient Instructions (Addendum)
Use the Premarin cream, pea sized amount to the urethra twice a week.   Urinary Frequency, Adult Urinary frequency means urinating more often than usual. People with urinary frequency urinate at least 8 times in 24 hours, even if they drink a normal amount of fluid. Although they urinate more often than normal, the total amount of urine produced in a day may be normal. Urinary frequency is also called pollakiuria. What are the causes? This condition may be caused by:  A urinary tract infection.  Obesity.  Bladder problems, such as bladder stones.  Caffeine or alcohol.  Eating food or drinking fluids that irritate the bladder. These include coffee, tea, soda, artificial sweeteners, citrus, tomato-based foods, and chocolate.  Certain medicines, such as medicines that help the body get rid of extra fluid (diuretics).  Muscle or nerve weakness.  Overactive bladder.  Chronic diabetes.  Interstitial cystitis.  In men, problems with the prostate, such as an enlarged prostate.  In women, pregnancy.  In some cases, the cause may not be known. What increases the risk? This condition is more likely to develop in:  Women who have gone through menopause.  Men with prostate problems.  People with a disease or injury that affects the nerves or spinal cord.  People who have or have had a condition that affects the brain, such as a stroke.  What are the signs or symptoms? Symptoms of this condition include:  Feeling an urgent need to urinate often. The stress and anxiety of needing to find a bathroom quickly can make this urge worse.  Urinating 8 or more times in 24 hours.  Urinating as often as every 1 to 2 hours.  How is this diagnosed? This condition is diagnosed based on your symptoms, your medical history, and a physical exam. You may have tests, such as:  Blood tests.  Urine tests.  Imaging tests, such as X-rays or ultrasounds.  A bladder test.  A test of your  neurological system. This is the body system that senses the need to urinate.  A test to check for problems in the urethra and bladder called cystoscopy.  You may also be asked to keep a bladder diary. A bladder diary is a record of what you eat and drink, how often you urinate, and how much you urinate. You may need to see a health care provider who specializes in conditions of the urinary tract (urologist) or kidneys (nephrologist). How is this treated? Treatment for this condition depends on the cause. Sometimes the condition goes away on its own and treatment is not necessary. If treatment is needed, it may include:  Taking medicine.  Learning exercises that strengthen the muscles that help control urination.  Following a bladder training program. This may include: ? Learning to delay going to the bathroom. ? Double urinating (voiding). This helps if you are not completely emptying your bladder. ? Scheduled voiding.  Making diet changes, such as: ? Avoiding caffeine. ? Drinking fewer fluids, especially alcohol. ? Not drinking in the evening. ? Not having foods or drinks that may irritate the bladder. ? Eating foods that help prevent or ease constipation. Constipation can make this condition worse.  Having the nerves in your bladder stimulated. There are two options for stimulating the nerves to your bladder: ? Outpatient electrical nerve stimulation. This is done by your health care provider. ? Surgery to implant a bladder pacemaker. The pacemaker helps to control the urge to urinate.  Follow these instructions at home:  Keep a bladder diary if told to by your health care provider.  Take over-the-counter and prescription medicines only as told by your health care provider.  Do any exercises as told by your health care provider.  Follow a bladder training program as told by your health care provider.  Make any recommended diet changes.  Keep all follow-up visits as told by  your health care provider. This is important. Contact a health care provider if:  You start urinating more often.  You feel pain or irritation when you urinate.  You notice blood in your urine.  Your urine looks cloudy.  You develop a fever.  You begin vomiting. Get help right away if:  You are unable to urinate. This information is not intended to replace advice given to you by your health care provider. Make sure you discuss any questions you have with your health care provider. Document Released: 12/09/2008 Document Revised: 03/16/2015 Document Reviewed: 09/08/2014 Elsevier Interactive Patient Education  [employer].

## 2017-01-03 NOTE — Progress Notes (Signed)
GYNECOLOGY  VISIT   HPI: 76 y.o.   Divorced  Caucasian  female   G2P2002 with Patient's last menstrual period was 02/27/2000.   here for urinary frequency and dysuria. Urination feels hot and painful today.   No hematuria.  No back pain, nausea, vomiting, or fever.   These same symptoms have occurred in the past and [facility].  Last UTI was March or April this year.  This is her third UTI this year.   Drinks hot tea in am, sweet tea in afternoon.  Uses lemon in tea and water.   Urine Dip: 1+WBCs, Trace RBCs.  Dealing with sciatica.  Wants to do dance class.  GYNECOLOGIC HISTORY: Patient's last menstrual period was 02/27/2000. Contraception:  Hysterectomy Menopausal hormone therapy: none Last mammogram: 10/2016 normal per pt:[facility] Last pap smear: 01-15-13 Neg        OB History    Gravida Para Term Preterm AB Living   2 2 2  0 0 2   SAB TAB Ectopic Multiple Live Births   0 0 0 2           Patient Active Problem List   Diagnosis Date Noted  . Sinus node dysfunction ([place]) 06/16/2015  . Paroxysmal atrial fibrillation ([place]) 08/26/2013  . History of atrial fibrillation 08/24/2013  . Vagally Mediated Asystole 07/29/2013  . Acute respiratory failure ([place]) 07/29/2013    Past Medical History:  Diagnosis Date  . Acute respiratory failure with hypoxia ([place])   . Asthma    "I'll have an attack if I'm around cat or dust"  . Asystole ([place])   . Atrial fibrillation ([place])   . CHB (complete heart block) ([place]) 05/2015   s/p Biotronik (serial number [id number]) dual lead pacemaker  . Depression   . Exercise-induced asthma   . Hx of colonic polyps   . Osteopenia   . PONV (postoperative nausea and vomiting)    pt. thinks it was from dilaudid  . Presence of permanent cardiac pacemaker     Past Surgical History:  Procedure Laterality Date  . APPENDECTOMY    . CHOLECYSTECTOMY OPEN    . FRACTURE SURGERY    . INSERT / REPLACE / REMOVE  PACEMAKER  06/16/2015  . LOOP RECORDER REMOVAL  06/16/2015  . TONSILLECTOMY  1940s  . TOTAL ABDOMINAL HYSTERECTOMY W/ BILATERAL SALPINGOOPHORECTOMY  2015   Abdominal sacrocolpopexy, tension-free vaginal tape Exact, cystoscopy   . TUBAL LIGATION    . WRIST FRACTURE SURGERY Left    plate inserted    Current Outpatient Medications  Medication Sig Dispense Refill  . aspirin EC 81 MG tablet Take 81 mg by mouth daily.    . Azelastine HCl 0.15 % SOLN Place 1 spray as needed into both nostrils.    . B Complex Vitamins (VITAMIN-B COMPLEX PO) Take 1 capsule by mouth daily.     . Calcium-Vitamin D-Vitamin K (VIACTIV) 595-638-75 MG-UNT-MCG CHEW Chew 1 capsule by mouth 2 (two) times daily.     . Cholecalciferol (VITAMIN D) 2000 UNITS CAPS Take 2,000 Units by mouth daily.     . Coenzyme Q10 (CO Q 10) 10 MG CAPS Take 10 mg by mouth daily.     Marland Kitchen EPIPEN 2-PAK 0.3 MG/0.3ML SOAJ injection Inject 0.3 mg into the muscle as directed. Reported on 04/12/2015    . escitalopram (LEXAPRO) 10 MG tablet Take 5 mg by mouth. Takes 1 tablet twice weekly    . Flaxseed, Linseed, (  FLAXSEED OIL PO) Take 1,400 mg by mouth daily.    . NON FORMULARY Inject 1 each into the muscle once a week. Allergy injections once weekly    . pravastatin (PRAVACHOL) 20 MG tablet Take 20 mg by mouth 2 (two) times a week.     Marland Kitchen PRESCRIPTION MEDICATION ALLERGY INJECTIONS--takes once weekly    . Turmeric 450 MG CAPS Take 450 mg by mouth daily.     . VENTOLIN HFA 108 (90 BASE) MCG/ACT inhaler Inhale 1-2 puffs into the lungs daily as needed for wheezing or shortness of breath.     . Zoledronic Acid (RECLAST IV) Inject into the vein See admin instructions. Uses every other year    . conjugated estrogens (PREMARIN) vaginal cream Place 1 Applicatorful daily vaginally. Place a pea sized amount to the urethra twice a week. 30 g 0  . phenazopyridine (PYRIDIUM) 100 MG tablet Take 1 tablet (100 mg total) 3 (three) times daily as needed by mouth for pain. 10  tablet 0  . sulfamethoxazole-trimethoprim (BACTRIM DS) 800-160 MG tablet Take 1 tablet 2 (two) times daily by mouth. One PO BID x 3 days 6 tablet 0   No current facility-administered medications for this visit.      ALLERGIES: Other; Codeine; Dilaudid [hydromorphone hcl]; and Morphine and related  Family History  Problem Relation Age of Onset  . Varicose Veins Mother   . Heart attack Father   . Cancer Brother        colon cancer (polyps)    Social History   Socioeconomic History  . Marital status: Divorced    Spouse name: Not on file  . Number of children: Not on file  . Years of education: Not on file  . Highest education level: Not on file  Social Needs  . Financial resource strain: Not on file  . Food insecurity - worry: Not on file  . Food insecurity - inability: Not on file  . Transportation needs - medical: Not on file  . Transportation needs - non-medical: Not on file  Occupational History  . Not on file  Tobacco Use  . Smoking status: Former Smoker    Packs/day: 1.00    Years: 30.00    Pack years: 30.00    Types: Cigarettes    Last attempt to quit: 07/02/1988    Years since quitting: 28.5  . Smokeless tobacco: Never Used  Substance and Sexual Activity  . Alcohol use: Yes    Alcohol/week: 0.6 oz    Types: 1 Standard drinks or equivalent per week    Comment: 06/16/2015 "probably have a couple drinks q other weekend"  . Drug use: No  . Sexual activity: Not Currently    Partners: Male    Birth control/protection: Post-menopausal, Surgical    Comment: TAH/BSO  Other Topics Concern  . Not on file  Social History Narrative  . Not on file    ROS:  Pertinent items are noted in HPI.  PHYSICAL EXAMINATION:    BP 140/80 (BP Location: Right Arm, Patient Position: Sitting, Cuff Size: Normal)   Pulse 80   Ht 5\' 3"  (1.6 m)   Wt 138 lb (62.6 kg)   LMP 02/27/2000   BMI 24.45 kg/m     General appearance: alert, cooperative and appears stated age  Chaperone was  present for exam.  ASSESSMENT  Dysuria.  Recurrent UTI. Status post TAH/BSO/Abdominal sacrocolpopexy, tension-free vaginal tape Exact mid urethral sling, cystoscopy.  PLAN  Urine micro and culture.  Bactrim  DS po bid x 3 days.  Call if not improved. Start Premarin vaginal cream with pea sized amount to the urethra twice weekly.  We talked about potential increased risk of breast cancer with use. Needs annual exam appointment with Dr. Sabra Heck.  I recommended yearly GYN exams. We will need to get a copy of her mammogram from [facility] for this year.    An After Visit Summary was printed and given to the patient.  __15____ minutes face to face time of which over 50% was spent in counseling.

## 2017-01-04 ENCOUNTER — Encounter: Payer: Self-pay | Admitting: Obstetrics and Gynecology

## 2017-01-04 LAB — URINALYSIS, MICROSCOPIC ONLY
Casts: NONE SEEN /lpf
Epithelial Cells (non renal): NONE SEEN /hpf (ref 0–10)
WBC, UA: >30 /hpf — AB (ref 0–?)

## 2017-01-04 MED ORDER — ESTROGENS, CONJUGATED 0.625 MG/GM VA CREA: TOPICAL_CREAM | VAGINAL | 0 refills | Status: DC

## 2017-01-07 DIAGNOSIS — M5416 Radiculopathy, lumbar region: Secondary | ICD-10-CM | POA: Diagnosis not present

## 2017-01-07 LAB — URINE CULTURE

## 2017-01-15 DIAGNOSIS — J301 Allergic rhinitis due to pollen: Secondary | ICD-10-CM | POA: Diagnosis not present

## 2017-01-15 DIAGNOSIS — J3089 Other allergic rhinitis: Secondary | ICD-10-CM | POA: Diagnosis not present

## 2017-01-22 DIAGNOSIS — M5416 Radiculopathy, lumbar region: Secondary | ICD-10-CM | POA: Diagnosis not present

## 2017-01-24 DIAGNOSIS — J3089 Other allergic rhinitis: Secondary | ICD-10-CM | POA: Diagnosis not present

## 2017-01-24 DIAGNOSIS — J301 Allergic rhinitis due to pollen: Secondary | ICD-10-CM | POA: Diagnosis not present

## 2017-02-06 DIAGNOSIS — M5136 Other intervertebral disc degeneration, lumbar region: Secondary | ICD-10-CM | POA: Diagnosis not present

## 2017-02-06 DIAGNOSIS — M9905 Segmental and somatic dysfunction of pelvic region: Secondary | ICD-10-CM | POA: Diagnosis not present

## 2017-02-06 DIAGNOSIS — M9903 Segmental and somatic dysfunction of lumbar region: Secondary | ICD-10-CM | POA: Diagnosis not present

## 2017-02-06 DIAGNOSIS — M9904 Segmental and somatic dysfunction of sacral region: Secondary | ICD-10-CM | POA: Diagnosis not present

## 2017-02-06 DIAGNOSIS — M5416 Radiculopathy, lumbar region: Secondary | ICD-10-CM | POA: Diagnosis not present

## 2017-02-06 DIAGNOSIS — M25552 Pain in left hip: Secondary | ICD-10-CM | POA: Diagnosis not present

## 2017-02-08 DIAGNOSIS — M9903 Segmental and somatic dysfunction of lumbar region: Secondary | ICD-10-CM | POA: Diagnosis not present

## 2017-02-08 DIAGNOSIS — M25552 Pain in left hip: Secondary | ICD-10-CM | POA: Diagnosis not present

## 2017-02-08 DIAGNOSIS — M9904 Segmental and somatic dysfunction of sacral region: Secondary | ICD-10-CM | POA: Diagnosis not present

## 2017-02-08 DIAGNOSIS — M5136 Other intervertebral disc degeneration, lumbar region: Secondary | ICD-10-CM | POA: Diagnosis not present

## 2017-02-08 DIAGNOSIS — M9905 Segmental and somatic dysfunction of pelvic region: Secondary | ICD-10-CM | POA: Diagnosis not present

## 2017-02-08 DIAGNOSIS — M5416 Radiculopathy, lumbar region: Secondary | ICD-10-CM | POA: Diagnosis not present

## 2017-02-11 DIAGNOSIS — J3089 Other allergic rhinitis: Secondary | ICD-10-CM | POA: Diagnosis not present

## 2017-02-11 DIAGNOSIS — M5136 Other intervertebral disc degeneration, lumbar region: Secondary | ICD-10-CM | POA: Diagnosis not present

## 2017-02-11 DIAGNOSIS — M5416 Radiculopathy, lumbar region: Secondary | ICD-10-CM | POA: Diagnosis not present

## 2017-02-11 DIAGNOSIS — M25552 Pain in left hip: Secondary | ICD-10-CM | POA: Diagnosis not present

## 2017-02-11 DIAGNOSIS — J301 Allergic rhinitis due to pollen: Secondary | ICD-10-CM | POA: Diagnosis not present

## 2017-02-11 DIAGNOSIS — M9903 Segmental and somatic dysfunction of lumbar region: Secondary | ICD-10-CM | POA: Diagnosis not present

## 2017-02-11 DIAGNOSIS — M9904 Segmental and somatic dysfunction of sacral region: Secondary | ICD-10-CM | POA: Diagnosis not present

## 2017-02-11 DIAGNOSIS — M9905 Segmental and somatic dysfunction of pelvic region: Secondary | ICD-10-CM | POA: Diagnosis not present

## 2017-02-12 DIAGNOSIS — M25552 Pain in left hip: Secondary | ICD-10-CM | POA: Diagnosis not present

## 2017-02-12 DIAGNOSIS — M5416 Radiculopathy, lumbar region: Secondary | ICD-10-CM | POA: Diagnosis not present

## 2017-02-12 DIAGNOSIS — M5136 Other intervertebral disc degeneration, lumbar region: Secondary | ICD-10-CM | POA: Diagnosis not present

## 2017-02-12 DIAGNOSIS — M9903 Segmental and somatic dysfunction of lumbar region: Secondary | ICD-10-CM | POA: Diagnosis not present

## 2017-02-12 DIAGNOSIS — M9905 Segmental and somatic dysfunction of pelvic region: Secondary | ICD-10-CM | POA: Diagnosis not present

## 2017-02-12 DIAGNOSIS — M9904 Segmental and somatic dysfunction of sacral region: Secondary | ICD-10-CM | POA: Diagnosis not present

## 2017-02-28 DIAGNOSIS — R82998 Other abnormal findings in urine: Secondary | ICD-10-CM | POA: Diagnosis not present

## 2017-02-28 DIAGNOSIS — E7849 Other hyperlipidemia: Secondary | ICD-10-CM | POA: Diagnosis not present

## 2017-02-28 DIAGNOSIS — M810 Age-related osteoporosis without current pathological fracture: Secondary | ICD-10-CM | POA: Diagnosis not present

## 2017-02-28 DIAGNOSIS — R7301 Impaired fasting glucose: Secondary | ICD-10-CM | POA: Diagnosis not present

## 2017-03-04 DIAGNOSIS — M9903 Segmental and somatic dysfunction of lumbar region: Secondary | ICD-10-CM | POA: Diagnosis not present

## 2017-03-04 DIAGNOSIS — M4726 Other spondylosis with radiculopathy, lumbar region: Secondary | ICD-10-CM | POA: Diagnosis not present

## 2017-03-04 DIAGNOSIS — M62838 Other muscle spasm: Secondary | ICD-10-CM | POA: Diagnosis not present

## 2017-03-04 DIAGNOSIS — M9901 Segmental and somatic dysfunction of cervical region: Secondary | ICD-10-CM | POA: Diagnosis not present

## 2017-03-05 DIAGNOSIS — M62838 Other muscle spasm: Secondary | ICD-10-CM | POA: Diagnosis not present

## 2017-03-05 DIAGNOSIS — M9903 Segmental and somatic dysfunction of lumbar region: Secondary | ICD-10-CM | POA: Diagnosis not present

## 2017-03-05 DIAGNOSIS — M4726 Other spondylosis with radiculopathy, lumbar region: Secondary | ICD-10-CM | POA: Diagnosis not present

## 2017-03-05 DIAGNOSIS — M9901 Segmental and somatic dysfunction of cervical region: Secondary | ICD-10-CM | POA: Diagnosis not present

## 2017-03-06 DIAGNOSIS — M9903 Segmental and somatic dysfunction of lumbar region: Secondary | ICD-10-CM | POA: Diagnosis not present

## 2017-03-06 DIAGNOSIS — M4726 Other spondylosis with radiculopathy, lumbar region: Secondary | ICD-10-CM | POA: Diagnosis not present

## 2017-03-06 DIAGNOSIS — M62838 Other muscle spasm: Secondary | ICD-10-CM | POA: Diagnosis not present

## 2017-03-06 DIAGNOSIS — M9901 Segmental and somatic dysfunction of cervical region: Secondary | ICD-10-CM | POA: Diagnosis not present

## 2017-03-07 DIAGNOSIS — Z950 Presence of cardiac pacemaker: Secondary | ICD-10-CM | POA: Diagnosis not present

## 2017-03-07 DIAGNOSIS — M810 Age-related osteoporosis without current pathological fracture: Secondary | ICD-10-CM | POA: Diagnosis not present

## 2017-03-07 DIAGNOSIS — M25511 Pain in right shoulder: Secondary | ICD-10-CM | POA: Diagnosis not present

## 2017-03-07 DIAGNOSIS — M9901 Segmental and somatic dysfunction of cervical region: Secondary | ICD-10-CM | POA: Diagnosis not present

## 2017-03-07 DIAGNOSIS — H9193 Unspecified hearing loss, bilateral: Secondary | ICD-10-CM | POA: Diagnosis not present

## 2017-03-07 DIAGNOSIS — E7849 Other hyperlipidemia: Secondary | ICD-10-CM | POA: Diagnosis not present

## 2017-03-07 DIAGNOSIS — Z0000 Encounter for general adult medical examination without abnormal findings: Secondary | ICD-10-CM | POA: Diagnosis not present

## 2017-03-07 DIAGNOSIS — M4726 Other spondylosis with radiculopathy, lumbar region: Secondary | ICD-10-CM | POA: Diagnosis not present

## 2017-03-07 DIAGNOSIS — J3089 Other allergic rhinitis: Secondary | ICD-10-CM | POA: Diagnosis not present

## 2017-03-07 DIAGNOSIS — I480 Paroxysmal atrial fibrillation: Secondary | ICD-10-CM | POA: Diagnosis not present

## 2017-03-07 DIAGNOSIS — R69 Illness, unspecified: Secondary | ICD-10-CM | POA: Diagnosis not present

## 2017-03-07 DIAGNOSIS — M62838 Other muscle spasm: Secondary | ICD-10-CM | POA: Diagnosis not present

## 2017-03-07 DIAGNOSIS — M9903 Segmental and somatic dysfunction of lumbar region: Secondary | ICD-10-CM | POA: Diagnosis not present

## 2017-03-07 DIAGNOSIS — M5430 Sciatica, unspecified side: Secondary | ICD-10-CM | POA: Diagnosis not present

## 2017-03-07 DIAGNOSIS — H9313 Tinnitus, bilateral: Secondary | ICD-10-CM | POA: Diagnosis not present

## 2017-03-07 DIAGNOSIS — J301 Allergic rhinitis due to pollen: Secondary | ICD-10-CM | POA: Diagnosis not present

## 2017-03-11 DIAGNOSIS — M9901 Segmental and somatic dysfunction of cervical region: Secondary | ICD-10-CM | POA: Diagnosis not present

## 2017-03-11 DIAGNOSIS — M9903 Segmental and somatic dysfunction of lumbar region: Secondary | ICD-10-CM | POA: Diagnosis not present

## 2017-03-11 DIAGNOSIS — M62838 Other muscle spasm: Secondary | ICD-10-CM | POA: Diagnosis not present

## 2017-03-11 DIAGNOSIS — M4726 Other spondylosis with radiculopathy, lumbar region: Secondary | ICD-10-CM | POA: Diagnosis not present

## 2017-03-12 DIAGNOSIS — Z1212 Encounter for screening for malignant neoplasm of rectum: Secondary | ICD-10-CM | POA: Diagnosis not present

## 2017-03-12 DIAGNOSIS — M4726 Other spondylosis with radiculopathy, lumbar region: Secondary | ICD-10-CM | POA: Diagnosis not present

## 2017-03-12 DIAGNOSIS — M9903 Segmental and somatic dysfunction of lumbar region: Secondary | ICD-10-CM | POA: Diagnosis not present

## 2017-03-12 DIAGNOSIS — M9901 Segmental and somatic dysfunction of cervical region: Secondary | ICD-10-CM | POA: Diagnosis not present

## 2017-03-12 DIAGNOSIS — M62838 Other muscle spasm: Secondary | ICD-10-CM | POA: Diagnosis not present

## 2017-03-13 DIAGNOSIS — M62838 Other muscle spasm: Secondary | ICD-10-CM | POA: Diagnosis not present

## 2017-03-13 DIAGNOSIS — M9903 Segmental and somatic dysfunction of lumbar region: Secondary | ICD-10-CM | POA: Diagnosis not present

## 2017-03-13 DIAGNOSIS — M4726 Other spondylosis with radiculopathy, lumbar region: Secondary | ICD-10-CM | POA: Diagnosis not present

## 2017-03-13 DIAGNOSIS — M9901 Segmental and somatic dysfunction of cervical region: Secondary | ICD-10-CM | POA: Diagnosis not present

## 2017-03-13 DIAGNOSIS — J301 Allergic rhinitis due to pollen: Secondary | ICD-10-CM | POA: Diagnosis not present

## 2017-03-13 DIAGNOSIS — J3089 Other allergic rhinitis: Secondary | ICD-10-CM | POA: Diagnosis not present

## 2017-03-18 ENCOUNTER — Ambulatory Visit (INDEPENDENT_AMBULATORY_CARE_PROVIDER_SITE_OTHER): Payer: Medicare HMO | Admitting: *Deleted

## 2017-03-18 DIAGNOSIS — M9903 Segmental and somatic dysfunction of lumbar region: Secondary | ICD-10-CM | POA: Diagnosis not present

## 2017-03-18 DIAGNOSIS — M62838 Other muscle spasm: Secondary | ICD-10-CM | POA: Diagnosis not present

## 2017-03-18 DIAGNOSIS — M9901 Segmental and somatic dysfunction of cervical region: Secondary | ICD-10-CM | POA: Diagnosis not present

## 2017-03-18 DIAGNOSIS — I495 Sick sinus syndrome: Secondary | ICD-10-CM | POA: Diagnosis not present

## 2017-03-18 DIAGNOSIS — M4726 Other spondylosis with radiculopathy, lumbar region: Secondary | ICD-10-CM | POA: Diagnosis not present

## 2017-03-18 NOTE — Progress Notes (Signed)
Remote pacemaker transmission.   

## 2017-03-19 ENCOUNTER — Encounter: Payer: Self-pay | Admitting: Cardiology

## 2017-03-19 DIAGNOSIS — M9903 Segmental and somatic dysfunction of lumbar region: Secondary | ICD-10-CM | POA: Diagnosis not present

## 2017-03-19 DIAGNOSIS — M9901 Segmental and somatic dysfunction of cervical region: Secondary | ICD-10-CM | POA: Diagnosis not present

## 2017-03-19 DIAGNOSIS — M62838 Other muscle spasm: Secondary | ICD-10-CM | POA: Diagnosis not present

## 2017-03-19 DIAGNOSIS — M4726 Other spondylosis with radiculopathy, lumbar region: Secondary | ICD-10-CM | POA: Diagnosis not present

## 2017-03-20 DIAGNOSIS — M9903 Segmental and somatic dysfunction of lumbar region: Secondary | ICD-10-CM | POA: Diagnosis not present

## 2017-03-20 DIAGNOSIS — M62838 Other muscle spasm: Secondary | ICD-10-CM | POA: Diagnosis not present

## 2017-03-20 DIAGNOSIS — M9901 Segmental and somatic dysfunction of cervical region: Secondary | ICD-10-CM | POA: Diagnosis not present

## 2017-03-20 DIAGNOSIS — M4726 Other spondylosis with radiculopathy, lumbar region: Secondary | ICD-10-CM | POA: Diagnosis not present

## 2017-03-20 LAB — CUP PACEART REMOTE DEVICE CHECK
Battery Remaining Percentage: 85 %
Brady Statistic AP VP Percent: 0 %
Brady Statistic AP VS Percent: 34 %
Brady Statistic AS VP Percent: 0 %
Brady Statistic AS VS Percent: 66 %
Brady Statistic RA Percent Paced: 34 %
Brady Statistic RV Percent Paced: 0 %
Date Time Interrogation Session: 20190123072308
Implantable Lead Implant Date: 20170420
Implantable Lead Implant Date: 20170420
Implantable Lead Location: 753859
Implantable Lead Location: 753860
Implantable Lead Model: 377
Implantable Lead Model: 377
Implantable Lead Serial Number: 49378160
Implantable Lead Serial Number: 49421706
Implantable Pulse Generator Implant Date: 20170420
Lead Channel Impedance Value: 462 Ohm
Lead Channel Impedance Value: 642 Ohm
Lead Channel Pacing Threshold Amplitude: 0.8 V
Lead Channel Pacing Threshold Amplitude: 0.9 V
Lead Channel Pacing Threshold Pulse Width: 0.4 ms
Lead Channel Pacing Threshold Pulse Width: 0.4 ms
Lead Channel Setting Pacing Amplitude: 2 V
Lead Channel Setting Pacing Amplitude: 2.4 V
Lead Channel Setting Pacing Pulse Width: 0.4 ms
Pulse Gen Model: 394969
Pulse Gen Serial Number: 68713503

## 2017-03-25 DIAGNOSIS — M9903 Segmental and somatic dysfunction of lumbar region: Secondary | ICD-10-CM | POA: Diagnosis not present

## 2017-03-25 DIAGNOSIS — M62838 Other muscle spasm: Secondary | ICD-10-CM | POA: Diagnosis not present

## 2017-03-25 DIAGNOSIS — M9901 Segmental and somatic dysfunction of cervical region: Secondary | ICD-10-CM | POA: Diagnosis not present

## 2017-03-25 DIAGNOSIS — M4726 Other spondylosis with radiculopathy, lumbar region: Secondary | ICD-10-CM | POA: Diagnosis not present

## 2017-03-26 DIAGNOSIS — M9903 Segmental and somatic dysfunction of lumbar region: Secondary | ICD-10-CM | POA: Diagnosis not present

## 2017-03-26 DIAGNOSIS — M62838 Other muscle spasm: Secondary | ICD-10-CM | POA: Diagnosis not present

## 2017-03-26 DIAGNOSIS — M4726 Other spondylosis with radiculopathy, lumbar region: Secondary | ICD-10-CM | POA: Diagnosis not present

## 2017-03-26 DIAGNOSIS — M9901 Segmental and somatic dysfunction of cervical region: Secondary | ICD-10-CM | POA: Diagnosis not present

## 2017-03-27 DIAGNOSIS — M9903 Segmental and somatic dysfunction of lumbar region: Secondary | ICD-10-CM | POA: Diagnosis not present

## 2017-03-27 DIAGNOSIS — M4726 Other spondylosis with radiculopathy, lumbar region: Secondary | ICD-10-CM | POA: Diagnosis not present

## 2017-03-27 DIAGNOSIS — M9901 Segmental and somatic dysfunction of cervical region: Secondary | ICD-10-CM | POA: Diagnosis not present

## 2017-03-27 DIAGNOSIS — M62838 Other muscle spasm: Secondary | ICD-10-CM | POA: Diagnosis not present

## 2017-03-28 DIAGNOSIS — J3089 Other allergic rhinitis: Secondary | ICD-10-CM | POA: Diagnosis not present

## 2017-03-28 DIAGNOSIS — J4520 Mild intermittent asthma, uncomplicated: Secondary | ICD-10-CM | POA: Diagnosis not present

## 2017-03-28 DIAGNOSIS — J301 Allergic rhinitis due to pollen: Secondary | ICD-10-CM | POA: Diagnosis not present

## 2017-03-28 DIAGNOSIS — J3081 Allergic rhinitis due to animal (cat) (dog) hair and dander: Secondary | ICD-10-CM | POA: Diagnosis not present

## 2017-04-01 DIAGNOSIS — H2513 Age-related nuclear cataract, bilateral: Secondary | ICD-10-CM | POA: Diagnosis not present

## 2017-04-04 DIAGNOSIS — J301 Allergic rhinitis due to pollen: Secondary | ICD-10-CM | POA: Diagnosis not present

## 2017-04-04 DIAGNOSIS — J3089 Other allergic rhinitis: Secondary | ICD-10-CM | POA: Diagnosis not present

## 2017-04-05 ENCOUNTER — Encounter: Payer: Self-pay | Admitting: Internal Medicine

## 2017-04-09 DIAGNOSIS — R69 Illness, unspecified: Secondary | ICD-10-CM | POA: Diagnosis not present

## 2017-04-11 DIAGNOSIS — J3089 Other allergic rhinitis: Secondary | ICD-10-CM | POA: Diagnosis not present

## 2017-04-11 DIAGNOSIS — J301 Allergic rhinitis due to pollen: Secondary | ICD-10-CM | POA: Diagnosis not present

## 2017-04-25 DIAGNOSIS — J3089 Other allergic rhinitis: Secondary | ICD-10-CM | POA: Diagnosis not present

## 2017-04-25 DIAGNOSIS — J301 Allergic rhinitis due to pollen: Secondary | ICD-10-CM | POA: Diagnosis not present

## 2017-05-06 DIAGNOSIS — J301 Allergic rhinitis due to pollen: Secondary | ICD-10-CM | POA: Diagnosis not present

## 2017-05-06 DIAGNOSIS — J3089 Other allergic rhinitis: Secondary | ICD-10-CM | POA: Diagnosis not present

## 2017-05-09 DIAGNOSIS — J3089 Other allergic rhinitis: Secondary | ICD-10-CM | POA: Diagnosis not present

## 2017-05-10 ENCOUNTER — Ambulatory Visit: Payer: Medicare HMO | Admitting: Obstetrics & Gynecology

## 2017-05-15 DIAGNOSIS — J301 Allergic rhinitis due to pollen: Secondary | ICD-10-CM | POA: Diagnosis not present

## 2017-05-15 DIAGNOSIS — J3089 Other allergic rhinitis: Secondary | ICD-10-CM | POA: Diagnosis not present

## 2017-05-16 NOTE — Progress Notes (Signed)
77 y.o. [id number] DivorcedCaucasianF here for annual exam.  Doing well.  Had a pacemaker placed 4/17.  Has chosen not to be on a blood thinner.  Denies vaginal bleeding.  Does have some mild urinary urgency.    Patient's last menstrual period was 02/27/2000.          Sexually active: No.  The current method of family planning is status post hysterectomy.    Exercising: Yes.    yoga, aerobics, walking Smoker:  no  Health Maintenance: Pap:  01/15/13 neg History of abnormal Pap:  Many yrs ago MMG:  12/03/16 category b density birads 1:neg Colonoscopy:  09/19/15, adenomatous polyp, follow up 5 years BMD:   2018, usually does reclast, waiting on oral surgery TDaP:  2017 Pneumonia vaccine(s):  Had done Shingrix:   Had done Hep C testing: not done Screening Labs: will do with Dr. Joylene Draft   reports that she quit smoking about 28 years ago. Her smoking use included cigarettes. She has a 30.00 pack-year smoking history. She has never used smokeless tobacco. She reports that she drinks alcohol. She reports that she does not use drugs.  Past Medical History:  Diagnosis Date  . Acute respiratory failure with hypoxia ([place])   . Asthma    "I'll have an attack if I'm around cat or dust"  . Asystole ([place])   . Atrial fibrillation ([place])   . CHB (complete heart block) ([place]) 05/2015   s/p Biotronik (serial number [id number]) dual lead pacemaker  . Depression   . Exercise-induced asthma   . Hx of colonic polyps   . Osteopenia   . PONV (postoperative nausea and vomiting)    pt. thinks it was from dilaudid  . Presence of permanent cardiac pacemaker     Past Surgical History:  Procedure Laterality Date  . ABDOMINAL HYSTERECTOMY N/A 07/28/2013   Procedure: HYSTERECTOMY ABDOMINAL with SALPINGO OOPHORECTOMY;  Surgeon: Lyman Speller, MD;  Location: [facility];  Service: Gynecology;  Laterality: N/A;  request 4.5 hours  . ABDOMINAL SACROCOLPOPEXY N/A 07/28/2013   Procedure: ABDOMINO SACROCOLPOPEXY;  Surgeon:  Jamey Reas de Berton Lan, MD;  Location: [facility];  Service: Gynecology;  Laterality: N/A;  . APPENDECTOMY    . BLADDER SUSPENSION N/A 07/28/2013   Procedure: TRANSVAGINAL TAPE (TVT) PROCEDURE midurethral sling;  Surgeon: Everardo All Amundson de Berton Lan, MD;  Location: [facility];  Service: Gynecology;  Laterality: N/A;  . CHOLECYSTECTOMY OPEN    . CYSTOSCOPY N/A 07/28/2013   Procedure: CYSTOSCOPY;  Surgeon: Jamey Reas de Berton Lan, MD;  Location: [facility];  Service: Gynecology;  Laterality: N/A;  . EP IMPLANTABLE DEVICE N/A 06/16/2015   Procedure: Pacemaker Implant;  Surgeon: Evans Lance, MD; Biotronik (serial number [id number]) pacemaker; Laterality: Left  . EP IMPLANTABLE DEVICE N/A 06/16/2015   Procedure: Loop Recorder Removal;  Surgeon: Evans Lance, MD;  Location: [facility];  Service: Cardiovascular;  Laterality: N/A;  . FRACTURE SURGERY    . INSERT / REPLACE / REMOVE PACEMAKER  06/16/2015  . LOOP RECORDER IMPLANT N/A 10/21/2013   Procedure: LOOP RECORDER IMPLANT;  Surgeon: Evans Lance, MD;  Location: [facility];  Service: Cardiovascular;  Laterality: N/A;  . LOOP RECORDER REMOVAL  06/16/2015  . TEMPORARY PACEMAKER INSERTION N/A 07/29/2013   Procedure: TEMPORARY PACEMAKER INSERTION;  Surgeon: Wellington Hampshire, MD;  Location: [facility];  Service: Cardiovascular;  Laterality: N/A;  . TONSILLECTOMY  1940s  . TOTAL ABDOMINAL HYSTERECTOMY  W/ BILATERAL SALPINGOOPHORECTOMY  2015   Abdominal sacrocolpopexy, tension-free vaginal tape Exact, cystoscopy   . TUBAL LIGATION    . WRIST FRACTURE SURGERY Left    plate inserted    Current Outpatient Medications  Medication Sig Dispense Refill  . aspirin EC 81 MG tablet Take 81 mg by mouth daily.    . Azelastine HCl 0.15 % SOLN Place 1 spray as needed into both nostrils.    . B Complex Vitamins (VITAMIN-B COMPLEX PO) Take 1 capsule by mouth daily.     . Calcium-Vitamin D-Vitamin K (VIACTIV) 161-096-04 MG-UNT-MCG CHEW  Chew 1 capsule by mouth 2 (two) times daily.     . Cholecalciferol (VITAMIN D) 2000 UNITS CAPS Take 2,000 Units by mouth daily.     . Coenzyme Q10 (COQ10 PO) Take by mouth.    . escitalopram (LEXAPRO) 10 MG tablet Take 5 mg by mouth. Takes 1 tablet twice weekly    . Flaxseed, Linseed, (FLAXSEED OIL PO) Take 1,400 mg by mouth daily.    . NON FORMULARY Inject 1 each into the muscle once a week. Allergy injections once weekly    . pravastatin (PRAVACHOL) 20 MG tablet Take 20 mg by mouth 2 (two) times a week.     . TURMERIC PO Take by mouth.    . VENTOLIN HFA 108 (90 BASE) MCG/ACT inhaler Inhale 1-2 puffs into the lungs daily as needed for wheezing or shortness of breath.     . EPIPEN 2-PAK 0.3 MG/0.3ML SOAJ injection Inject 0.3 mg into the muscle as directed. Reported on 04/12/2015    . Zoledronic Acid (RECLAST IV) Inject into the vein See admin instructions. Uses every other year     No current facility-administered medications for this visit.     Family History  Problem Relation Age of Onset  . Varicose Veins Mother   . Heart attack Father   . Cancer Brother        colon cancer (polyps)    Review of Systems  Genitourinary: Positive for urgency.  All other systems reviewed and are negative.   Exam:   BP 130/80   Pulse 74   Resp 16   Ht 5' 2.5" (1.588 m)   Wt 140 lb (63.5 kg)   LMP 02/27/2000   BMI 25.20 kg/m    Height: 5' 2.5" (158.8 cm)  Ht Readings from Last 3 Encounters:  05/17/17 5' 2.5" (1.588 m)  01/03/17 5\' 3"  (1.6 m)  06/18/16 5' 3.5" (1.613 m)   General appearance: alert, cooperative and appears stated age Head: Normocephalic, without obvious abnormality, atraumatic Neck: no adenopathy, supple, symmetrical, trachea midline and thyroid normal to inspection and palpation Lungs: clear to auscultation bilaterally Breasts: normal appearance, no masses or tenderness Heart: regular rate and rhythm Abdomen: soft, non-tender; bowel sounds normal; no masses,  no  organomegaly Extremities: extremities normal, atraumatic, no cyanosis or edema Skin: Skin color, texture, turgor normal. No rashes or lesions Lymph nodes: Cervical, supraclavicular, and axillary nodes normal. No abnormal inguinal nodes palpated Neurologic: Grossly normal   Pelvic: External genitalia:  no lesions              Urethra:  normal appearing urethra with no masses, tenderness or lesions              Bartholins and Skenes: normal                 Vagina: normal appearing vagina with normal color and discharge, no lesions, second degree prolapse  Cervix: absent              Pap taken: No. Bimanual Exam:  Uterus:  uterus absent              Adnexa: no mass, fullness, tenderness               Rectovaginal: Confirms               Anus:  normal sphincter tone, no lesions  Chaperone was present for exam.  A:  Well Woman with normal exam PMP, no HRT H/O TAH/BSO,abdominosacrocolpopexy, cystoscopy,2016 Sinus node dysfunction Osteopenia.  On Reclast.   H/O colon polyps  P:   Mammogram guidelines reviewed.  Doing 3D MMG. pap smear not indicated Lab work and vaccines with Dr. Joylene Draft return annually or prn

## 2017-05-17 ENCOUNTER — Ambulatory Visit (INDEPENDENT_AMBULATORY_CARE_PROVIDER_SITE_OTHER): Payer: Medicare HMO | Admitting: Obstetrics & Gynecology

## 2017-05-17 ENCOUNTER — Other Ambulatory Visit: Payer: Self-pay

## 2017-05-17 ENCOUNTER — Encounter: Payer: Self-pay | Admitting: Obstetrics & Gynecology

## 2017-05-17 VITALS — BP 130/80 | HR 74 | Resp 16 | Ht 62.5 in | Wt 140.0 lb

## 2017-05-17 DIAGNOSIS — Z01419 Encounter for gynecological examination (general) (routine) without abnormal findings: Secondary | ICD-10-CM

## 2017-05-24 DIAGNOSIS — J301 Allergic rhinitis due to pollen: Secondary | ICD-10-CM | POA: Diagnosis not present

## 2017-05-24 DIAGNOSIS — J3089 Other allergic rhinitis: Secondary | ICD-10-CM | POA: Diagnosis not present

## 2017-05-28 DIAGNOSIS — J301 Allergic rhinitis due to pollen: Secondary | ICD-10-CM | POA: Diagnosis not present

## 2017-05-28 DIAGNOSIS — J3089 Other allergic rhinitis: Secondary | ICD-10-CM | POA: Diagnosis not present

## 2017-05-28 DIAGNOSIS — R69 Illness, unspecified: Secondary | ICD-10-CM | POA: Diagnosis not present

## 2017-06-03 DIAGNOSIS — J301 Allergic rhinitis due to pollen: Secondary | ICD-10-CM | POA: Diagnosis not present

## 2017-06-03 DIAGNOSIS — J3089 Other allergic rhinitis: Secondary | ICD-10-CM | POA: Diagnosis not present

## 2017-06-13 DIAGNOSIS — J301 Allergic rhinitis due to pollen: Secondary | ICD-10-CM | POA: Diagnosis not present

## 2017-06-13 DIAGNOSIS — J3089 Other allergic rhinitis: Secondary | ICD-10-CM | POA: Diagnosis not present

## 2017-06-17 ENCOUNTER — Ambulatory Visit (INDEPENDENT_AMBULATORY_CARE_PROVIDER_SITE_OTHER): Payer: Medicare HMO | Admitting: *Deleted

## 2017-06-17 DIAGNOSIS — J301 Allergic rhinitis due to pollen: Secondary | ICD-10-CM | POA: Diagnosis not present

## 2017-06-17 DIAGNOSIS — I495 Sick sinus syndrome: Secondary | ICD-10-CM

## 2017-06-17 DIAGNOSIS — J3089 Other allergic rhinitis: Secondary | ICD-10-CM | POA: Diagnosis not present

## 2017-06-17 NOTE — Progress Notes (Signed)
Remote pacemaker transmission.   

## 2017-06-18 ENCOUNTER — Encounter: Payer: Self-pay | Admitting: Cardiology

## 2017-06-18 ENCOUNTER — Encounter: Payer: Self-pay | Admitting: Internal Medicine

## 2017-06-20 LAB — CUP PACEART REMOTE DEVICE CHECK
Date Time Interrogation Session: 20190425092012
Implantable Lead Implant Date: 20170420
Implantable Lead Implant Date: 20170420
Implantable Lead Location: 753859
Implantable Lead Location: 753860
Implantable Lead Model: 377
Implantable Lead Model: 377
Implantable Lead Serial Number: 49378160
Implantable Lead Serial Number: 49421706
Implantable Pulse Generator Implant Date: 20170420
Pulse Gen Model: 394969
Pulse Gen Serial Number: 68713503

## 2017-06-26 ENCOUNTER — Encounter: Payer: Medicare HMO | Admitting: Internal Medicine

## 2017-06-28 ENCOUNTER — Encounter: Payer: Self-pay | Admitting: Internal Medicine

## 2017-06-28 ENCOUNTER — Ambulatory Visit: Payer: Medicare HMO | Admitting: Internal Medicine

## 2017-06-28 VITALS — BP 130/86 | HR 74 | Ht 62.0 in | Wt 142.6 lb

## 2017-06-28 DIAGNOSIS — I495 Sick sinus syndrome: Secondary | ICD-10-CM

## 2017-06-28 DIAGNOSIS — I480 Paroxysmal atrial fibrillation: Secondary | ICD-10-CM | POA: Diagnosis not present

## 2017-06-28 DIAGNOSIS — Z950 Presence of cardiac pacemaker: Secondary | ICD-10-CM

## 2017-06-28 DIAGNOSIS — J301 Allergic rhinitis due to pollen: Secondary | ICD-10-CM | POA: Diagnosis not present

## 2017-06-28 DIAGNOSIS — J3089 Other allergic rhinitis: Secondary | ICD-10-CM | POA: Diagnosis not present

## 2017-06-28 LAB — CUP PACEART INCLINIC DEVICE CHECK
Battery Remaining Longevity: 119 mo
Date Time Interrogation Session: 20190503165202
Implantable Lead Implant Date: 20170420
Implantable Lead Implant Date: 20170420
Implantable Lead Location: 753859
Implantable Lead Location: 753860
Implantable Lead Model: 377
Implantable Lead Model: 377
Implantable Lead Serial Number: 49378160
Implantable Lead Serial Number: 49421706
Implantable Pulse Generator Implant Date: 20170420
Lead Channel Impedance Value: 468 Ohm
Lead Channel Impedance Value: 663 Ohm
Lead Channel Pacing Threshold Amplitude: 0.7 V
Lead Channel Pacing Threshold Amplitude: 0.7 V
Lead Channel Pacing Threshold Amplitude: 0.8 V
Lead Channel Pacing Threshold Amplitude: 0.9 V
Lead Channel Pacing Threshold Amplitude: 1 V
Lead Channel Pacing Threshold Amplitude: 1 V
Lead Channel Pacing Threshold Pulse Width: 0.4 ms
Lead Channel Pacing Threshold Pulse Width: 0.4 ms
Lead Channel Pacing Threshold Pulse Width: 0.4 ms
Lead Channel Pacing Threshold Pulse Width: 0.4 ms
Lead Channel Pacing Threshold Pulse Width: 0.4 ms
Lead Channel Pacing Threshold Pulse Width: 0.4 ms
Lead Channel Sensing Intrinsic Amplitude: 13.7 mV
Lead Channel Sensing Intrinsic Amplitude: 13.8 mV
Lead Channel Sensing Intrinsic Amplitude: 5.9 mV
Lead Channel Sensing Intrinsic Amplitude: 6 mV
Lead Channel Setting Pacing Amplitude: 2 V
Lead Channel Setting Pacing Amplitude: 2.4 V
Lead Channel Setting Pacing Pulse Width: 0.4 ms
Pulse Gen Model: 394969
Pulse Gen Serial Number: 68713503

## 2017-06-28 NOTE — Patient Instructions (Signed)
Medication Instructions:  Your physician recommends that you continue on your current medications as directed. Please refer to the Current Medication list given to you today.  Labwork: None ordered.  Testing/Procedures: None ordered.  Follow-Up: Your physician wants you to follow-up in: one year with Dr. Lovena Le.   You will receive a reminder letter in the mail two months in advance. If you don't receive a letter, please call our office to schedule the follow-up appointment.  Remote monitoring is used to monitor your Pacemaker from home. This monitoring reduces the number of office visits required to check your device to one time per year. It allows [place] to keep an eye on the functioning of your device to ensure it is working properly. You are scheduled for a device check from home on 09/16/2017. You may send your transmission at any time that day. If you have a wireless device, the transmission will be sent automatically. After your physician reviews your transmission, you will receive a postcard with your next transmission date.  Any Other Special Instructions Will Be Listed Below (If Applicable).  If you need a refill on your cardiac medications before your next appointment, please call your pharmacy.

## 2017-06-28 NOTE — Progress Notes (Signed)
HPI Danielle Lucas returns today for ongoing evaluation and management of atrial fibrillation and syncope.  She had a 10 second pause and underwent insertion of a PPM over 2 years ago.. Since then she has done well and denies chest pain or sob. No palpitations. No syncope.   Allergies  Allergen Reactions  . Other Anaphylaxis    Cat dander  . Codeine Nausea And Vomiting  . Dilaudid [Hydromorphone Hcl] Nausea And Vomiting  . Morphine And Related Nausea And Vomiting     Current Outpatient Medications  Medication Sig Dispense Refill  . aspirin EC 81 MG tablet Take 81 mg by mouth daily.    . Azelastine HCl 0.15 % SOLN Place 1 spray as needed into both nostrils.    . B Complex Vitamins (VITAMIN-B COMPLEX PO) Take 1 capsule by mouth daily.     . Calcium-Vitamin D-Vitamin K (VIACTIV) [phone] MG-UNT-MCG CHEW Chew 1 capsule by mouth 2 (two) times daily.     . Cholecalciferol (VITAMIN D) 2000 UNITS CAPS Take 2,000 Units by mouth daily.     . Coenzyme Q10 (COQ10 PO) Take by mouth.    . EPIPEN 2-PAK 0.3 MG/0.3ML SOAJ injection Inject 0.3 mg into the muscle as directed. Reported on 04/12/2015    . escitalopram (LEXAPRO) 10 MG tablet Take 5 mg by mouth. Takes 1 tablet twice weekly    . Flaxseed, Linseed, (FLAXSEED OIL PO) Take 1,400 mg by mouth daily.    . NON FORMULARY Inject 1 each into the muscle once a week. Allergy injections once weekly    . pravastatin (PRAVACHOL) 20 MG tablet Take 20 mg by mouth 2 (two) times a week.     . TURMERIC PO Take by mouth.    . VENTOLIN HFA 108 (90 BASE) MCG/ACT inhaler Inhale 1-2 puffs into the lungs daily as needed for wheezing or shortness of breath.     . Zoledronic Acid (RECLAST IV) Inject into the vein See admin instructions. Uses every other year     No current facility-administered medications for this visit.      Past Medical History:  Diagnosis Date  . Abnormal Pap smear of cervix   . Acute respiratory failure with hypoxia ([place])   .  Asthma    "I'll have an attack if I'm around cat or dust"  . Asystole ([place])   . Atrial fibrillation ([place])   . CHB (complete heart block) ([place]) 05/2015   s/p Biotronik (serial number [id number]) dual lead pacemaker  . Depression   . Exercise-induced asthma   . Hx of colonic polyps   . Osteopenia   . PONV (postoperative nausea and vomiting)    pt. thinks it was from dilaudid  . Presence of permanent cardiac pacemaker     ROS:   All systems reviewed and negative except as noted in the HPI.   Past Surgical History:  Procedure Laterality Date  . ABDOMINAL HYSTERECTOMY N/A 07/28/2013   Procedure: HYSTERECTOMY ABDOMINAL with SALPINGO OOPHORECTOMY;  Surgeon: Lyman Speller, MD;  Location: [facility];  Service: Gynecology;  Laterality: N/A;  request 4.5 hours  . ABDOMINAL SACROCOLPOPEXY N/A 07/28/2013   Procedure: ABDOMINO SACROCOLPOPEXY;  Surgeon: Jamey Reas de Berton Lan, MD;  Location: [facility];  Service: Gynecology;  Laterality: N/A;  . APPENDECTOMY    . BLADDER SUSPENSION N/A 07/28/2013   Procedure: TRANSVAGINAL TAPE (TVT) PROCEDURE midurethral sling;  Surgeon: Everardo All Amundson de Berton Lan, MD;  Location: [facility];  Service: Gynecology;  Laterality: N/A;  . CHOLECYSTECTOMY OPEN    . CRYOTHERAPY     for abnormal pap smear yrs ago  . CYSTOSCOPY N/A 07/28/2013   Procedure: CYSTOSCOPY;  Surgeon: Jamey Reas de Berton Lan, MD;  Location: [facility];  Service: Gynecology;  Laterality: N/A;  . EP IMPLANTABLE DEVICE N/A 06/16/2015   Procedure: Pacemaker Implant;  Surgeon: Evans Lance, MD; Biotronik (serial number [id number]) pacemaker; Laterality: Left  . EP IMPLANTABLE DEVICE N/A 06/16/2015   Procedure: Loop Recorder Removal;  Surgeon: Evans Lance, MD;  Location: [facility];  Service: Cardiovascular;  Laterality: N/A;  . FRACTURE SURGERY    . INSERT / REPLACE / REMOVE PACEMAKER  06/16/2015  . LOOP RECORDER IMPLANT N/A 10/21/2013   Procedure: LOOP RECORDER IMPLANT;   Surgeon: Evans Lance, MD;  Location[facility];  Service: Cardiovascular;  Laterality: N/A;  . LOOP RECORDER REMOVAL  06/16/2015  . TEMPORARY PACEMAKER INSERTION N/A 07/29/2013   Procedure: TEMPORARY PACEMAKER INSERTION;  Surgeon: Wellington Hampshire, MD;  Location: [facility];  Service: Cardiovascular;  Laterality: N/A;  . TONSILLECTOMY  1940s  . TOTAL ABDOMINAL HYSTERECTOMY W/ BILATERAL SALPINGOOPHORECTOMY  2015   Abdominal sacrocolpopexy, tension-free vaginal tape Exact, cystoscopy   . TUBAL LIGATION    . WRIST FRACTURE SURGERY Left    plate inserted     Family History  Problem Relation Age of Onset  . Varicose Veins Mother   . Heart attack Father   . Cancer Brother        colon cancer (polyps)     Social History   Socioeconomic History  . Marital status: Divorced    Spouse name: Not on file  . Number of children: Not on file  . Years of education: Not on file  . Highest education level: Not on file  Occupational History  . Not on file  Social Needs  . Financial resource strain: Not on file  . Food insecurity:    Worry: Not on file    Inability: Not on file  . Transportation needs:    Medical: Not on file    Non-medical: Not on file  Tobacco Use  . Smoking status: Former Smoker    Packs/day: 1.00    Years: 30.00    Pack years: 30.00    Types: Cigarettes    Last attempt to quit: 07/02/1988    Years since quitting: 29.0  . Smokeless tobacco: Never Used  Substance and Sexual Activity  . Alcohol use: Yes    Alcohol/week: 0.0 - 0.6 oz  . Drug use: No  . Sexual activity: Not Currently    Partners: Male    Birth control/protection: Post-menopausal, Surgical    Comment: TAH/BSO  Lifestyle  . Physical activity:    Days per week: Not on file    Minutes per session: Not on file  . Stress: Not on file  Relationships  . Social connections:    Talks on phone: Not on file    Gets together: Not on file    Attends religious service: Not on file    Active member of  club or organization: Not on file    Attends meetings of clubs or organizations: Not on file    Relationship status: Not on file  . Intimate partner violence:    Fear of current or ex partner: Not on file    Emotionally abused: Not on file    Physically abused: Not on file  Forced sexual activity: Not on file  Other Topics Concern  . Not on file  Social History Narrative  . Not on file     BP 130/86   Pulse 74   Ht 5\' 2"  (1.575 m)   Wt 142 lb 9.6 oz (64.7 kg)   LMP 02/27/2000   SpO2 97%   BMI 26.08 kg/m   Physical Exam:  Well appearing 77 yo woman, NAD HEENT: Unremarkable Neck:  6 cm JVD, no thyromegally Lymphatics:  No adenopathy Back:  No CVA tenderness Lungs:  Clear with no wheezes HEART:  Regular rate rhythm, no murmurs, no rubs, no clicks Abd:  soft, positive bowel sounds, no organomegally, no rebound, no guarding Ext:  2 plus pulses, no edema, no cyanosis, no clubbing Skin:  No rashes no nodules Neuro:  CN II through XII intact, motor grossly intact  EKG - nsr  DEVICE  Normal device function.  See PaceArt for details.   Assess/Plan: 1. Sinus node dysfunction - she is s/p PPM insertion and is asymptomatic. 2. PPM - her Biotronik DDD PM is working normally. Will recheck in several months. 3. HTN - her blood pressure is well conrtrolled.  She will continue her current meds.  Mikle Bosworth.D.

## 2017-07-04 DIAGNOSIS — J3089 Other allergic rhinitis: Secondary | ICD-10-CM | POA: Diagnosis not present

## 2017-07-04 DIAGNOSIS — J301 Allergic rhinitis due to pollen: Secondary | ICD-10-CM | POA: Diagnosis not present

## 2017-07-19 DIAGNOSIS — J3089 Other allergic rhinitis: Secondary | ICD-10-CM | POA: Diagnosis not present

## 2017-07-19 DIAGNOSIS — J301 Allergic rhinitis due to pollen: Secondary | ICD-10-CM | POA: Diagnosis not present

## 2017-08-05 DIAGNOSIS — J301 Allergic rhinitis due to pollen: Secondary | ICD-10-CM | POA: Diagnosis not present

## 2017-08-05 DIAGNOSIS — J3089 Other allergic rhinitis: Secondary | ICD-10-CM | POA: Diagnosis not present

## 2017-08-20 DIAGNOSIS — J301 Allergic rhinitis due to pollen: Secondary | ICD-10-CM | POA: Diagnosis not present

## 2017-08-20 DIAGNOSIS — J3089 Other allergic rhinitis: Secondary | ICD-10-CM | POA: Diagnosis not present

## 2017-09-02 DIAGNOSIS — J301 Allergic rhinitis due to pollen: Secondary | ICD-10-CM | POA: Diagnosis not present

## 2017-09-02 DIAGNOSIS — J3089 Other allergic rhinitis: Secondary | ICD-10-CM | POA: Diagnosis not present

## 2017-09-16 ENCOUNTER — Ambulatory Visit (INDEPENDENT_AMBULATORY_CARE_PROVIDER_SITE_OTHER): Payer: Medicare HMO | Admitting: *Deleted

## 2017-09-16 DIAGNOSIS — I495 Sick sinus syndrome: Secondary | ICD-10-CM | POA: Diagnosis not present

## 2017-09-16 NOTE — Progress Notes (Signed)
Remote pacemaker transmission.   

## 2017-09-17 ENCOUNTER — Encounter: Payer: Self-pay | Admitting: Cardiology

## 2017-09-20 DIAGNOSIS — J3089 Other allergic rhinitis: Secondary | ICD-10-CM | POA: Diagnosis not present

## 2017-09-20 DIAGNOSIS — J301 Allergic rhinitis due to pollen: Secondary | ICD-10-CM | POA: Diagnosis not present

## 2017-10-10 DIAGNOSIS — R69 Illness, unspecified: Secondary | ICD-10-CM | POA: Diagnosis not present

## 2017-10-10 DIAGNOSIS — E785 Hyperlipidemia, unspecified: Secondary | ICD-10-CM | POA: Diagnosis not present

## 2017-10-10 DIAGNOSIS — Z809 Family history of malignant neoplasm, unspecified: Secondary | ICD-10-CM | POA: Diagnosis not present

## 2017-10-10 DIAGNOSIS — R030 Elevated blood-pressure reading, without diagnosis of hypertension: Secondary | ICD-10-CM | POA: Diagnosis not present

## 2017-10-10 DIAGNOSIS — K219 Gastro-esophageal reflux disease without esophagitis: Secondary | ICD-10-CM | POA: Diagnosis not present

## 2017-10-10 DIAGNOSIS — Z7982 Long term (current) use of aspirin: Secondary | ICD-10-CM | POA: Diagnosis not present

## 2017-10-10 DIAGNOSIS — Z791 Long term (current) use of non-steroidal anti-inflammatories (NSAID): Secondary | ICD-10-CM | POA: Diagnosis not present

## 2017-10-10 DIAGNOSIS — F419 Anxiety disorder, unspecified: Secondary | ICD-10-CM | POA: Diagnosis not present

## 2017-10-10 DIAGNOSIS — J45909 Unspecified asthma, uncomplicated: Secondary | ICD-10-CM | POA: Diagnosis not present

## 2017-10-10 DIAGNOSIS — M1990 Unspecified osteoarthritis, unspecified site: Secondary | ICD-10-CM | POA: Diagnosis not present

## 2017-10-10 LAB — CUP PACEART REMOTE DEVICE CHECK
Date Time Interrogation Session: 20190815110556
Implantable Lead Implant Date: 20170420
Implantable Lead Implant Date: 20170420
Implantable Lead Location: 753859
Implantable Lead Location: 753860
Implantable Lead Model: 377
Implantable Lead Model: 377
Implantable Lead Serial Number: 49378160
Implantable Lead Serial Number: 49421706
Implantable Pulse Generator Implant Date: 20170420
Pulse Gen Model: 394969
Pulse Gen Serial Number: 68713503

## 2017-10-11 DIAGNOSIS — J301 Allergic rhinitis due to pollen: Secondary | ICD-10-CM | POA: Diagnosis not present

## 2017-10-11 DIAGNOSIS — J3089 Other allergic rhinitis: Secondary | ICD-10-CM | POA: Diagnosis not present

## 2017-10-29 DIAGNOSIS — J301 Allergic rhinitis due to pollen: Secondary | ICD-10-CM | POA: Diagnosis not present

## 2017-10-29 DIAGNOSIS — J3089 Other allergic rhinitis: Secondary | ICD-10-CM | POA: Diagnosis not present

## 2017-12-04 DIAGNOSIS — J3089 Other allergic rhinitis: Secondary | ICD-10-CM | POA: Diagnosis not present

## 2017-12-04 DIAGNOSIS — J301 Allergic rhinitis due to pollen: Secondary | ICD-10-CM | POA: Diagnosis not present

## 2017-12-05 ENCOUNTER — Encounter: Payer: Self-pay | Admitting: Obstetrics & Gynecology

## 2017-12-05 DIAGNOSIS — Z1231 Encounter for screening mammogram for malignant neoplasm of breast: Secondary | ICD-10-CM | POA: Diagnosis not present

## 2017-12-10 DIAGNOSIS — J3089 Other allergic rhinitis: Secondary | ICD-10-CM | POA: Diagnosis not present

## 2017-12-10 DIAGNOSIS — J301 Allergic rhinitis due to pollen: Secondary | ICD-10-CM | POA: Diagnosis not present

## 2017-12-18 ENCOUNTER — Ambulatory Visit (INDEPENDENT_AMBULATORY_CARE_PROVIDER_SITE_OTHER): Payer: Medicare HMO | Admitting: *Deleted

## 2017-12-18 DIAGNOSIS — I495 Sick sinus syndrome: Secondary | ICD-10-CM | POA: Diagnosis not present

## 2017-12-18 NOTE — Progress Notes (Signed)
Remote pacemaker transmission.   

## 2017-12-23 DIAGNOSIS — J3089 Other allergic rhinitis: Secondary | ICD-10-CM | POA: Diagnosis not present

## 2017-12-23 DIAGNOSIS — J301 Allergic rhinitis due to pollen: Secondary | ICD-10-CM | POA: Diagnosis not present

## 2017-12-23 DIAGNOSIS — H5203 Hypermetropia, bilateral: Secondary | ICD-10-CM | POA: Diagnosis not present

## 2017-12-23 DIAGNOSIS — H2513 Age-related nuclear cataract, bilateral: Secondary | ICD-10-CM | POA: Diagnosis not present

## 2017-12-26 DIAGNOSIS — R69 Illness, unspecified: Secondary | ICD-10-CM | POA: Diagnosis not present

## 2017-12-31 DIAGNOSIS — R69 Illness, unspecified: Secondary | ICD-10-CM | POA: Diagnosis not present

## 2018-01-06 DIAGNOSIS — J3089 Other allergic rhinitis: Secondary | ICD-10-CM | POA: Diagnosis not present

## 2018-01-06 DIAGNOSIS — J301 Allergic rhinitis due to pollen: Secondary | ICD-10-CM | POA: Diagnosis not present

## 2018-01-20 DIAGNOSIS — J3089 Other allergic rhinitis: Secondary | ICD-10-CM | POA: Diagnosis not present

## 2018-01-20 DIAGNOSIS — J301 Allergic rhinitis due to pollen: Secondary | ICD-10-CM | POA: Diagnosis not present

## 2018-01-20 IMAGING — DX DG CHEST 2V
2 series · 2 of 2 positions shown · non-contrast
Comparison: 07/30/2013, 07/29/2013

CLINICAL DATA: 74-year-old female with a history of weakness

EXAM:
CHEST - 2 VIEW

[w chest pa]
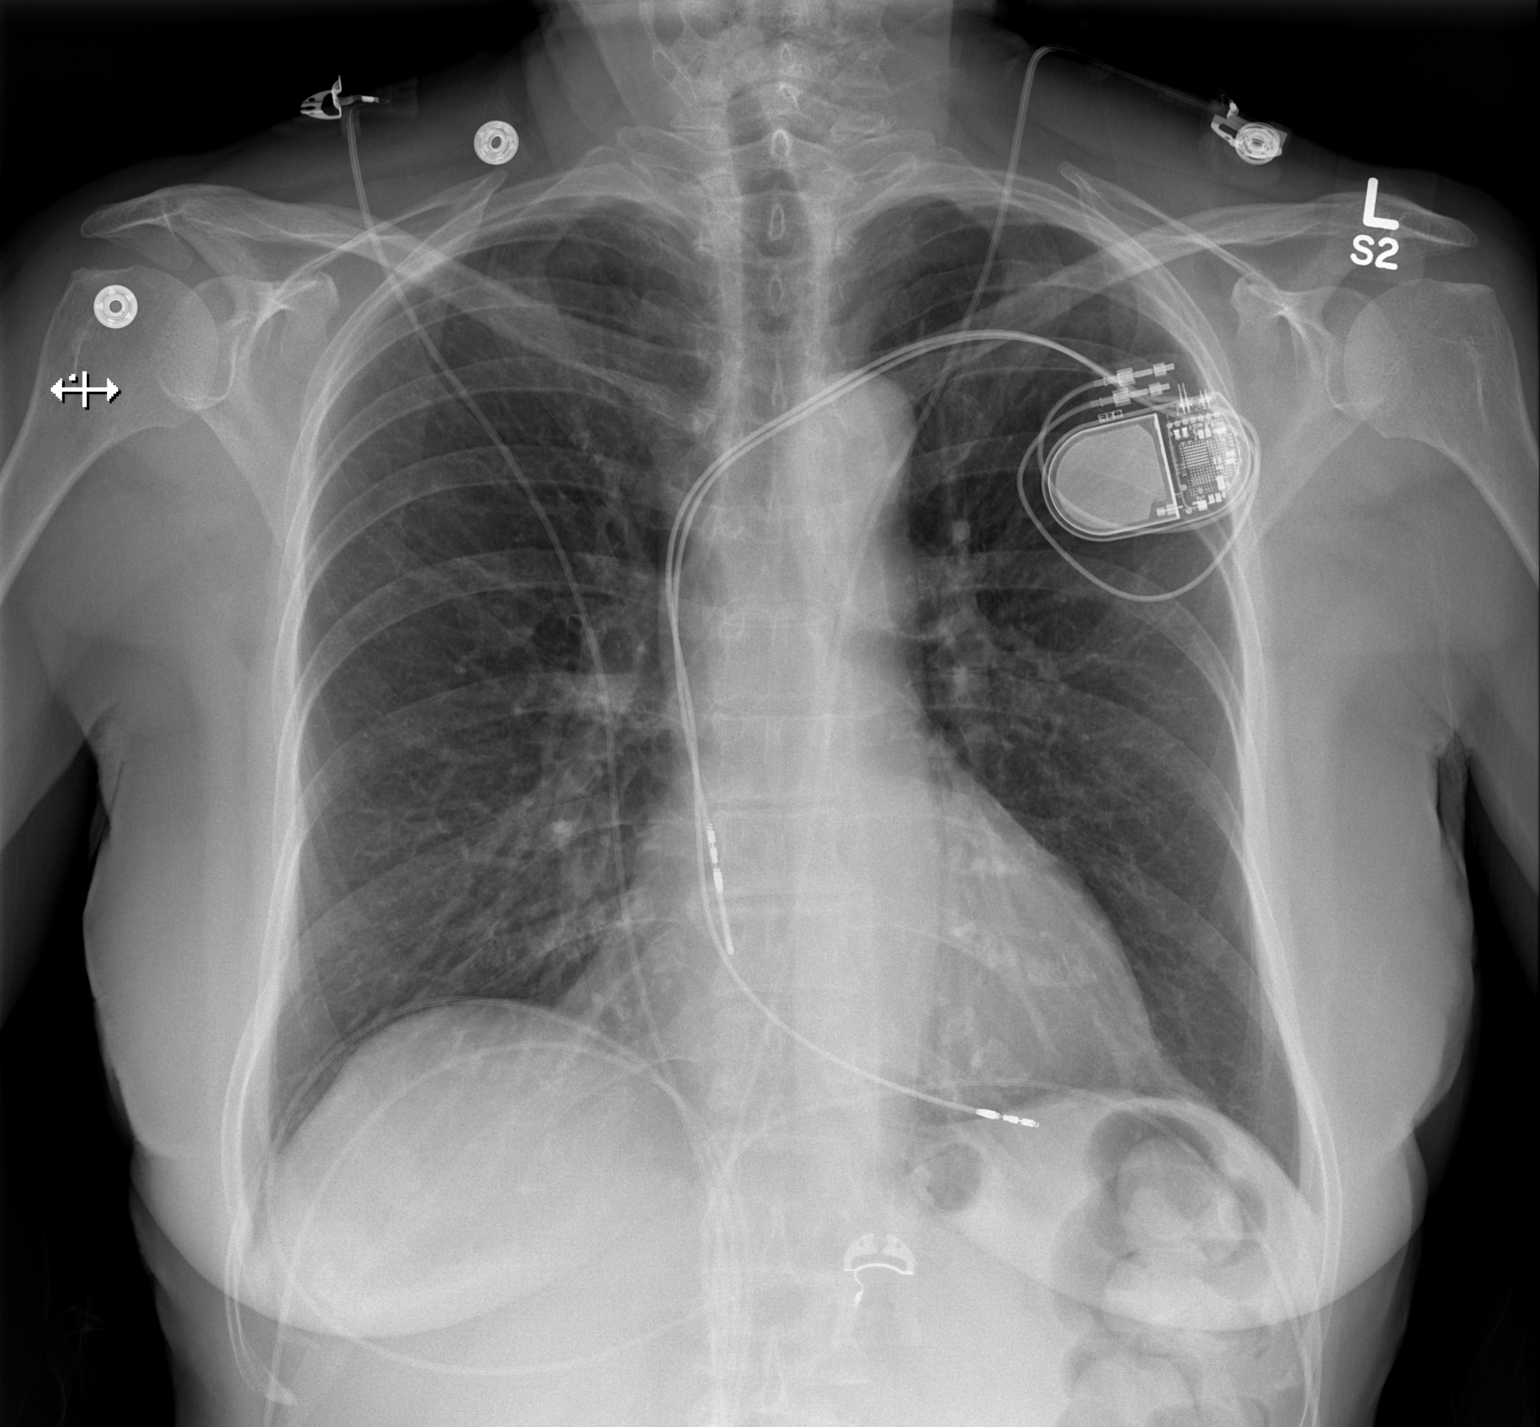

[w chest lat]
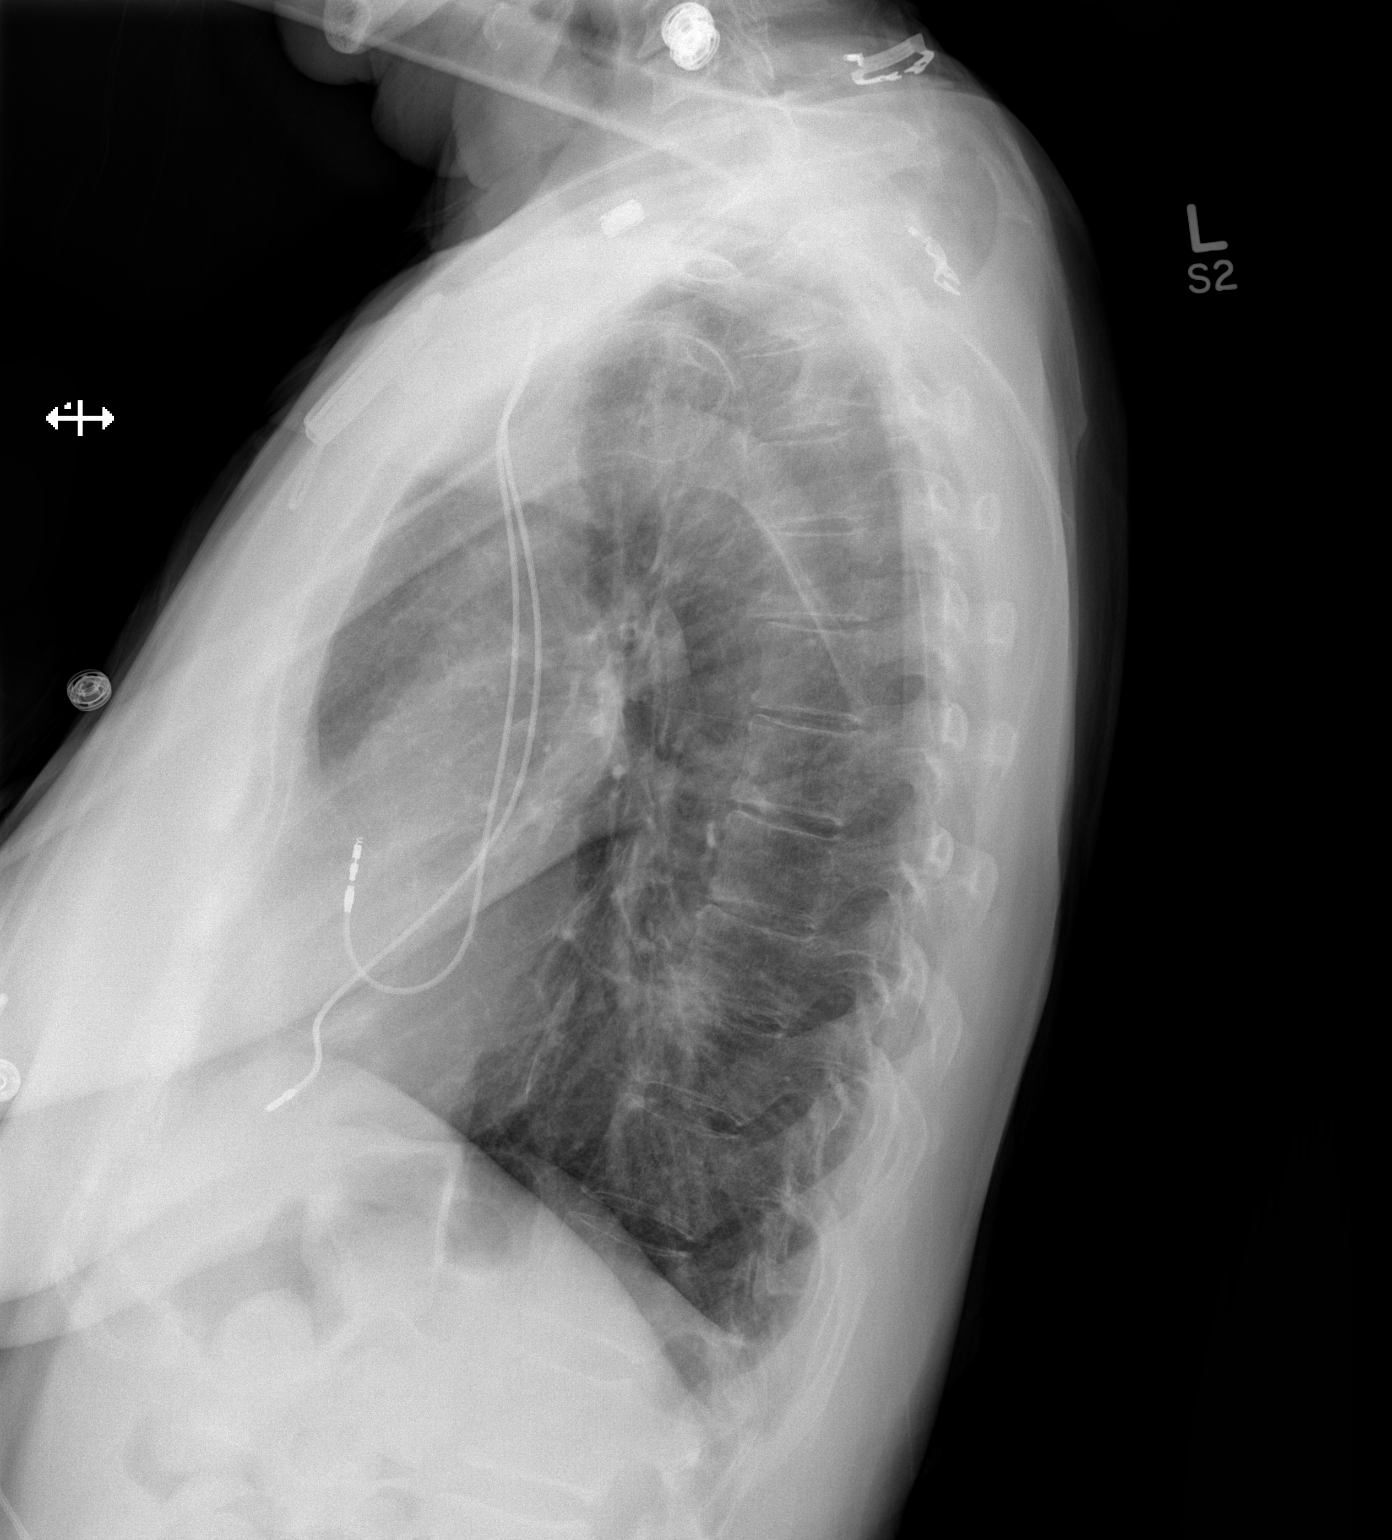

[2 of 2 positions shown; findings below may reference images not displayed]

FINDINGS: Cardiomediastinal silhouette unchanged.

Interval placement of left chest wall cardiac pacing device with 2
leads in place.

Compared to prior plain film there is improved aeration at the
bilateral lung bases with resolution of the pleural fluid.

Opacity at the base of the lung on the lateral view projecting over
the spine.

No pleural effusion or pneumothorax.

Chronic interstitial opacities, with evidence of emphysema.

No displaced fracture.

Unremarkable appearance of the upper abdomen.
IMPRESSION: Improved aeration of the lungs from the comparison of 0237, however,
there is focal opacity at the posterior base on the lateral view. If
the patient is not presenting with a concern for pneumonia/
pneumonitis, further evaluation with chest CT is recommended. If the
patient requires treatment for pneumonia, follow-up chest x-ray in 4
weeks-8 weeks is recommended to assure resolution.

These results will be called to the ordering clinician or
representative by the Radiologist Assistant, and communication
documented in the PACS or zVision Dashboard.

Interval placement of left chest wall cardiac pacing device.

Developing emphysema.

## 2018-02-03 DIAGNOSIS — J301 Allergic rhinitis due to pollen: Secondary | ICD-10-CM | POA: Diagnosis not present

## 2018-02-03 DIAGNOSIS — J3089 Other allergic rhinitis: Secondary | ICD-10-CM | POA: Diagnosis not present

## 2018-02-17 DIAGNOSIS — J301 Allergic rhinitis due to pollen: Secondary | ICD-10-CM | POA: Diagnosis not present

## 2018-02-17 DIAGNOSIS — J3089 Other allergic rhinitis: Secondary | ICD-10-CM | POA: Diagnosis not present

## 2018-02-22 LAB — CUP PACEART REMOTE DEVICE CHECK
Date Time Interrogation Session: 20191228181421
Implantable Lead Implant Date: 20170420
Implantable Lead Implant Date: 20170420
Implantable Lead Location: 753859
Implantable Lead Location: 753860
Implantable Lead Model: 377
Implantable Lead Model: 377
Implantable Lead Serial Number: 49378160
Implantable Lead Serial Number: 49421706
Implantable Pulse Generator Implant Date: 20170420
Pulse Gen Model: 394969
Pulse Gen Serial Number: 68713503

## 2018-02-27 DIAGNOSIS — H2511 Age-related nuclear cataract, right eye: Secondary | ICD-10-CM | POA: Diagnosis not present

## 2018-02-27 DIAGNOSIS — H25811 Combined forms of age-related cataract, right eye: Secondary | ICD-10-CM | POA: Diagnosis not present

## 2018-03-03 DIAGNOSIS — J301 Allergic rhinitis due to pollen: Secondary | ICD-10-CM | POA: Diagnosis not present

## 2018-03-03 DIAGNOSIS — J3089 Other allergic rhinitis: Secondary | ICD-10-CM | POA: Diagnosis not present

## 2018-03-05 DIAGNOSIS — J3089 Other allergic rhinitis: Secondary | ICD-10-CM | POA: Diagnosis not present

## 2018-03-10 DIAGNOSIS — J301 Allergic rhinitis due to pollen: Secondary | ICD-10-CM | POA: Diagnosis not present

## 2018-03-10 DIAGNOSIS — J3089 Other allergic rhinitis: Secondary | ICD-10-CM | POA: Diagnosis not present

## 2018-03-13 DIAGNOSIS — H25812 Combined forms of age-related cataract, left eye: Secondary | ICD-10-CM | POA: Diagnosis not present

## 2018-03-13 DIAGNOSIS — H2512 Age-related nuclear cataract, left eye: Secondary | ICD-10-CM | POA: Diagnosis not present

## 2018-03-18 DIAGNOSIS — J3089 Other allergic rhinitis: Secondary | ICD-10-CM | POA: Diagnosis not present

## 2018-03-18 DIAGNOSIS — J301 Allergic rhinitis due to pollen: Secondary | ICD-10-CM | POA: Diagnosis not present

## 2018-03-19 ENCOUNTER — Ambulatory Visit (INDEPENDENT_AMBULATORY_CARE_PROVIDER_SITE_OTHER): Payer: Medicare HMO

## 2018-03-19 DIAGNOSIS — I495 Sick sinus syndrome: Secondary | ICD-10-CM | POA: Diagnosis not present

## 2018-03-19 DIAGNOSIS — I480 Paroxysmal atrial fibrillation: Secondary | ICD-10-CM

## 2018-03-20 NOTE — Progress Notes (Signed)
Remote pacemaker transmission.   

## 2018-03-21 ENCOUNTER — Encounter: Payer: Self-pay | Admitting: Cardiology

## 2018-03-23 LAB — CUP PACEART REMOTE DEVICE CHECK
Date Time Interrogation Session: 20200126084138
Implantable Lead Implant Date: 20170420
Implantable Lead Implant Date: 20170420
Implantable Lead Location: 753859
Implantable Lead Location: 753860
Implantable Lead Model: 377
Implantable Lead Model: 377
Implantable Lead Serial Number: 49378160
Implantable Lead Serial Number: 49421706
Implantable Pulse Generator Implant Date: 20170420
Pulse Gen Model: 394969
Pulse Gen Serial Number: 68713503

## 2018-03-25 DIAGNOSIS — J3089 Other allergic rhinitis: Secondary | ICD-10-CM | POA: Diagnosis not present

## 2018-03-25 DIAGNOSIS — J301 Allergic rhinitis due to pollen: Secondary | ICD-10-CM | POA: Diagnosis not present

## 2018-03-25 DIAGNOSIS — J3081 Allergic rhinitis due to animal (cat) (dog) hair and dander: Secondary | ICD-10-CM | POA: Diagnosis not present

## 2018-03-25 DIAGNOSIS — J4520 Mild intermittent asthma, uncomplicated: Secondary | ICD-10-CM | POA: Diagnosis not present

## 2018-04-07 DIAGNOSIS — J301 Allergic rhinitis due to pollen: Secondary | ICD-10-CM | POA: Diagnosis not present

## 2018-04-07 DIAGNOSIS — M810 Age-related osteoporosis without current pathological fracture: Secondary | ICD-10-CM | POA: Diagnosis not present

## 2018-04-07 DIAGNOSIS — R7301 Impaired fasting glucose: Secondary | ICD-10-CM | POA: Diagnosis not present

## 2018-04-07 DIAGNOSIS — J3089 Other allergic rhinitis: Secondary | ICD-10-CM | POA: Diagnosis not present

## 2018-04-07 DIAGNOSIS — R82998 Other abnormal findings in urine: Secondary | ICD-10-CM | POA: Diagnosis not present

## 2018-04-07 DIAGNOSIS — E7849 Other hyperlipidemia: Secondary | ICD-10-CM | POA: Diagnosis not present

## 2018-04-11 DIAGNOSIS — J301 Allergic rhinitis due to pollen: Secondary | ICD-10-CM | POA: Diagnosis not present

## 2018-04-11 DIAGNOSIS — J3089 Other allergic rhinitis: Secondary | ICD-10-CM | POA: Diagnosis not present

## 2018-04-11 DIAGNOSIS — J069 Acute upper respiratory infection, unspecified: Secondary | ICD-10-CM | POA: Diagnosis not present

## 2018-04-11 DIAGNOSIS — J4520 Mild intermittent asthma, uncomplicated: Secondary | ICD-10-CM | POA: Diagnosis not present

## 2018-04-14 DIAGNOSIS — H9193 Unspecified hearing loss, bilateral: Secondary | ICD-10-CM | POA: Diagnosis not present

## 2018-04-14 DIAGNOSIS — Z1331 Encounter for screening for depression: Secondary | ICD-10-CM | POA: Diagnosis not present

## 2018-04-14 DIAGNOSIS — M810 Age-related osteoporosis without current pathological fracture: Secondary | ICD-10-CM | POA: Diagnosis not present

## 2018-04-14 DIAGNOSIS — M5430 Sciatica, unspecified side: Secondary | ICD-10-CM | POA: Diagnosis not present

## 2018-04-14 DIAGNOSIS — M25511 Pain in right shoulder: Secondary | ICD-10-CM | POA: Diagnosis not present

## 2018-04-14 DIAGNOSIS — Z950 Presence of cardiac pacemaker: Secondary | ICD-10-CM | POA: Diagnosis not present

## 2018-04-14 DIAGNOSIS — Z0000 Encounter for general adult medical examination without abnormal findings: Secondary | ICD-10-CM | POA: Diagnosis not present

## 2018-04-14 DIAGNOSIS — H9313 Tinnitus, bilateral: Secondary | ICD-10-CM | POA: Diagnosis not present

## 2018-04-14 DIAGNOSIS — R42 Dizziness and giddiness: Secondary | ICD-10-CM | POA: Diagnosis not present

## 2018-04-14 DIAGNOSIS — R69 Illness, unspecified: Secondary | ICD-10-CM | POA: Diagnosis not present

## 2018-04-18 DIAGNOSIS — Z1212 Encounter for screening for malignant neoplasm of rectum: Secondary | ICD-10-CM | POA: Diagnosis not present

## 2018-04-24 DIAGNOSIS — J3089 Other allergic rhinitis: Secondary | ICD-10-CM | POA: Diagnosis not present

## 2018-04-24 DIAGNOSIS — J301 Allergic rhinitis due to pollen: Secondary | ICD-10-CM | POA: Diagnosis not present

## 2018-04-28 DIAGNOSIS — J3089 Other allergic rhinitis: Secondary | ICD-10-CM | POA: Diagnosis not present

## 2018-04-28 DIAGNOSIS — J301 Allergic rhinitis due to pollen: Secondary | ICD-10-CM | POA: Diagnosis not present

## 2018-05-02 DIAGNOSIS — J3089 Other allergic rhinitis: Secondary | ICD-10-CM | POA: Diagnosis not present

## 2018-05-02 DIAGNOSIS — J301 Allergic rhinitis due to pollen: Secondary | ICD-10-CM | POA: Diagnosis not present

## 2018-05-05 DIAGNOSIS — J3089 Other allergic rhinitis: Secondary | ICD-10-CM | POA: Diagnosis not present

## 2018-05-05 DIAGNOSIS — J301 Allergic rhinitis due to pollen: Secondary | ICD-10-CM | POA: Diagnosis not present

## 2018-05-08 DIAGNOSIS — J301 Allergic rhinitis due to pollen: Secondary | ICD-10-CM | POA: Diagnosis not present

## 2018-05-08 DIAGNOSIS — J3089 Other allergic rhinitis: Secondary | ICD-10-CM | POA: Diagnosis not present

## 2018-05-26 DIAGNOSIS — J301 Allergic rhinitis due to pollen: Secondary | ICD-10-CM | POA: Diagnosis not present

## 2018-05-26 DIAGNOSIS — J3089 Other allergic rhinitis: Secondary | ICD-10-CM | POA: Diagnosis not present

## 2018-06-16 DIAGNOSIS — J3089 Other allergic rhinitis: Secondary | ICD-10-CM | POA: Diagnosis not present

## 2018-06-16 DIAGNOSIS — J301 Allergic rhinitis due to pollen: Secondary | ICD-10-CM | POA: Diagnosis not present

## 2018-06-18 ENCOUNTER — Ambulatory Visit (INDEPENDENT_AMBULATORY_CARE_PROVIDER_SITE_OTHER): Payer: Medicare HMO | Admitting: *Deleted

## 2018-06-18 ENCOUNTER — Other Ambulatory Visit: Payer: Self-pay

## 2018-06-18 DIAGNOSIS — I495 Sick sinus syndrome: Secondary | ICD-10-CM | POA: Diagnosis not present

## 2018-06-18 LAB — CUP PACEART REMOTE DEVICE CHECK
Date Time Interrogation Session: 20200422121138
Implantable Lead Implant Date: 20170420
Implantable Lead Implant Date: 20170420
Implantable Lead Location: 753859
Implantable Lead Location: 753860
Implantable Lead Model: 377
Implantable Lead Model: 377
Implantable Lead Serial Number: 49378160
Implantable Lead Serial Number: 49421706
Implantable Pulse Generator Implant Date: 20170420
Pulse Gen Model: 394969
Pulse Gen Serial Number: 68713503

## 2018-06-25 ENCOUNTER — Telehealth: Payer: Self-pay | Admitting: Internal Medicine

## 2018-06-25 NOTE — Telephone Encounter (Signed)
New message   Spoke with patient about appt on 05.05.20 with Dr. Lovena Le. Pt will do video visit with doxemity. Pt cell phone number is listed in the appt notes.      Virtual Visit Pre-Appointment Phone Call  "(Name), I am calling you today to discuss your upcoming appointment. We are currently trying to limit exposure to the virus that causes COVID-19 by seeing patients at home rather than in the office."  1. "What is the BEST phone number to call the day of the visit?" - include this in appointment notes  2. Do you have or have access to (through a family member/friend) a smartphone with video capability that we can use for your visit?" a. If yes - list this number in appt notes as cell (if different from BEST phone #) and list the appointment type as a VIDEO visit in appointment notes b. If no - list the appointment type as a PHONE visit in appointment notes  3. Confirm consent - "In the setting of the current Covid19 crisis, you are scheduled for a (phone or video) visit with your provider on (date) at (time).  Just as we do with many in-office visits, in order for you to participate in this visit, we must obtain consent.  If you'd like, I can send this to your mychart (if signed up) or email for you to review.  Otherwise, I can obtain your verbal consent now.  All virtual visits are billed to your insurance company just like a normal visit would be.  By agreeing to a virtual visit, we'd like you to understand that the technology does not allow for your provider to perform an examination, and thus may limit your provider's ability to fully assess your condition. If your provider identifies any concerns that need to be evaluated in person, we will make arrangements to do so.  Finally, though the technology is pretty good, we cannot assure that it will always work on either your or our end, and in the setting of a video visit, we may have to convert it to a phone-only visit.  In either  situation, we cannot ensure that we have a secure connection.  Are you willing to proceed?" STAFF: Did the patient verbally acknowledge consent to telehealth visit? Document YES/NO here: YES  4. Advise patient to be prepared - "Two hours prior to your appointment, go ahead and check your blood pressure, pulse, oxygen saturation, and your weight (if you have the equipment to check those) and write them all down. When your visit starts, your provider will ask you for this information. If you have an Apple Watch or Kardia device, please plan to have heart rate information ready on the day of your appointment. Please have a pen and paper handy nearby the day of the visit as well."  5. Give patient instructions for MyChart download to smartphone OR Doximity/Doxy.me as below if video visit (depending on what platform provider is using)  6. Inform patient they will receive a phone call 15 minutes prior to their appointment time (may be from unknown caller ID) so they should be prepared to answer    TELEPHONE CALL NOTE  CORINDA AMMON has been deemed a candidate for a follow-up tele-health visit to limit community exposure during the Covid-19 pandemic. I spoke with the patient via phone to ensure availability of phone/video source, confirm preferred email & phone number, and discuss instructions and expectations.  I reminded SHELLENE SWEIGERT to be prepared  with any vital sign and/or heart rhythm information that could potentially be obtained via home monitoring, at the time of her visit. I reminded ANGILA WOMBLES to expect a phone call prior to her visit.  Ashland P Edwards 06/25/2018 10:12 AM   INSTRUCTIONS FOR DOWNLOADING THE MYCHART APP TO SMARTPHONE  - The patient must first make sure to have activated MyChart and know their login information - If Apple, go to [employer] and type in MyChart in the search bar and download the app. If Android, ask patient to go to [employer] and type  in [place] in the search bar and download the app. The app is free but as with any other app downloads, their phone may require them to verify saved payment information or Apple/Android password.  - The patient will need to then log into the app with their MyChart username and password, and select [facility] as their healthcare provider to link the account. When it is time for your visit, go to the MyChart app, find appointments, and click Begin Video Visit. Be sure to Select Allow for your device to access the Microphone and Camera for your visit. You will then be connected, and your provider will be with you shortly.  **If they have any issues connecting, or need assistance please contact MyChart service desk [phone])**  **If using a computer, in order to ensure the best quality for their visit they will need to use either of the following Internet Browsers: [employer], or Google Chrome**  IF USING DOXIMITY or DOXY.ME - The patient will receive a link just prior to their visit by text.     FULL LENGTH CONSENT FOR TELE-HEALTH VISIT   I hereby voluntarily request, consent and authorize [facility] and its employed or contracted physicians, physician assistants, nurse practitioners or other licensed health care professionals (the Practitioner), to provide me with telemedicine health care services (the Services") as deemed necessary by the treating Practitioner. I acknowledge and consent to receive the Services by the Practitioner via telemedicine. I understand that the telemedicine visit will involve communicating with the Practitioner through live audiovisual communication technology and the disclosure of certain medical information by electronic transmission. I acknowledge that I have been given the opportunity to request an in-person assessment or other available alternative prior to the telemedicine visit and am voluntarily participating in the telemedicine visit.  I  understand that I have the right to withhold or withdraw my consent to the use of telemedicine in the course of my care at any time, without affecting my right to future care or treatment, and that the Practitioner or I may terminate the telemedicine visit at any time. I understand that I have the right to inspect all information obtained and/or recorded in the course of the telemedicine visit and may receive copies of available information for a reasonable fee.  I understand that some of the potential risks of receiving the Services via telemedicine include:   Delay or interruption in medical evaluation due to technological equipment failure or disruption;  Information transmitted may not be sufficient (e.g. poor resolution of images) to allow for appropriate medical decision making by the Practitioner; and/or   In rare instances, security protocols could fail, causing a breach of personal health information.  Furthermore, I acknowledge that it is my responsibility to provide information about my medical history, conditions and care that is complete and accurate to the best of my ability. I acknowledge that Practitioner's advice, recommendations,  and/or decision may be based on factors not within their control, such as incomplete or inaccurate data provided by me or distortions of diagnostic images or specimens that may result from electronic transmissions. I understand that the practice of medicine is not an exact science and that Practitioner makes no warranties or guarantees regarding treatment outcomes. I acknowledge that I will receive a copy of this consent concurrently upon execution via email to the email address I last provided but may also request a printed copy by calling the office of [facility].    I understand that my insurance will be billed for this visit.   I have read or had this consent read to me.  I understand the contents of this consent, which adequately explains the  benefits and risks of the Services being provided via telemedicine.   I have been provided ample opportunity to ask questions regarding this consent and the Services and have had my questions answered to my satisfaction.  I give my informed consent for the services to be provided through the use of telemedicine in my medical care  By participating in this telemedicine visit I agree to the above.

## 2018-06-26 ENCOUNTER — Encounter: Payer: Self-pay | Admitting: Cardiology

## 2018-06-26 NOTE — Progress Notes (Signed)
Remote pacemaker transmission.   

## 2018-07-01 ENCOUNTER — Telehealth (INDEPENDENT_AMBULATORY_CARE_PROVIDER_SITE_OTHER): Payer: Medicare HMO | Admitting: Internal Medicine

## 2018-07-01 ENCOUNTER — Other Ambulatory Visit: Payer: Self-pay

## 2018-07-01 DIAGNOSIS — Z950 Presence of cardiac pacemaker: Secondary | ICD-10-CM

## 2018-07-01 DIAGNOSIS — I495 Sick sinus syndrome: Secondary | ICD-10-CM | POA: Diagnosis not present

## 2018-07-01 DIAGNOSIS — I480 Paroxysmal atrial fibrillation: Secondary | ICD-10-CM | POA: Diagnosis not present

## 2018-07-01 NOTE — Progress Notes (Signed)
Electrophysiology TeleHealth Note   Due to national recommendations of social distancing due to COVID 19, an audio/video telehealth visit is felt to be most appropriate for this patient at this time.  See MyChart message from today for the patient's consent to telehealth for [place].   Date:  07/01/2018   ID:  [name], DOB 1940-08-09, MRN [id number]  Location: patient's home  Provider location: [address] [place]  Evaluation Performed: Follow-up visit  PCP:  Crist Infante, MD  Cardiologist:  No primary care provider on file. Electrophysiologist:  Dr Lovena Le  Chief Complaint:  "I've been doing ok".  History of Present Illness:    Danielle Lucas is a 78 y.o. female who presents via audio/video conferencing for a telehealth visit today. She has a h/o PAF and sinus node dysfunction, s/p PPM insertion after a 10 second pause.  Since last being seen in our clinic, the patient reports doing very well.  Today, she denies symptoms of palpitations, chest pain, shortness of breath,  lower extremity edema, dizziness, presyncope, or syncope.  The patient is otherwise without complaint today.  The patient denies symptoms of fevers, chills, cough, or new SOB worrisome for COVID 19.  Past Medical History:  Diagnosis Date  . Abnormal Pap smear of cervix   . Acute respiratory failure with hypoxia ([place])   . Asthma    "I'll have an attack if I'm around cat or dust"  . Asystole ([place])   . Atrial fibrillation ([place])   . CHB (complete heart block) ([place]) 05/2015   s/p Biotronik (serial number [id number]) dual lead pacemaker  . Depression   . Exercise-induced asthma   . Hx of colonic polyps   . Osteopenia   . PONV (postoperative nausea and vomiting)    pt. thinks it was from dilaudid  . Presence of permanent cardiac pacemaker     Past Surgical History:  Procedure Laterality Date  . ABDOMINAL HYSTERECTOMY N/A 07/28/2013   Procedure: HYSTERECTOMY ABDOMINAL with  SALPINGO OOPHORECTOMY;  Surgeon: Danielle Speller, MD;  Location: [facility];  Service: Gynecology;  Laterality: N/A;  request 4.5 hours  . ABDOMINAL SACROCOLPOPEXY N/A 07/28/2013   Procedure: ABDOMINO SACROCOLPOPEXY;  Surgeon: Danielle Reas de Berton Lan, MD;  Location: [facility];  Service: Gynecology;  Laterality: N/A;  . APPENDECTOMY    . BLADDER SUSPENSION N/A 07/28/2013   Procedure: TRANSVAGINAL TAPE (TVT) PROCEDURE midurethral sling;  Surgeon: Danielle All Amundson de Berton Lan, MD;  Location: [facility];  Service: Gynecology;  Laterality: N/A;  . CHOLECYSTECTOMY OPEN    . CRYOTHERAPY     for abnormal pap smear yrs ago  . CYSTOSCOPY N/A 07/28/2013   Procedure: CYSTOSCOPY;  Surgeon: Danielle Reas de Berton Lan, MD;  Location: [facility];  Service: Gynecology;  Laterality: N/A;  . EP IMPLANTABLE DEVICE N/A 06/16/2015   Procedure: Pacemaker Implant;  Surgeon: Danielle Lance, MD; Biotronik (serial number [id number]) pacemaker; Laterality: Left  . EP IMPLANTABLE DEVICE N/A 06/16/2015   Procedure: Loop Recorder Removal;  Surgeon: Danielle Lance, MD;  Location: [facility];  Service: Cardiovascular;  Laterality: N/A;  . FRACTURE SURGERY    . INSERT / REPLACE / REMOVE PACEMAKER  06/16/2015  . LOOP RECORDER IMPLANT N/A 10/21/2013   Procedure: LOOP RECORDER IMPLANT;  Surgeon: Danielle Lance, MD;  Location: [facility];  Service: Cardiovascular;  Laterality: N/A;  . LOOP RECORDER REMOVAL  06/16/2015  .  TEMPORARY PACEMAKER INSERTION N/A 07/29/2013   Procedure: TEMPORARY PACEMAKER INSERTION;  Surgeon: Danielle Hampshire, MD;  Location: [facility];  Service: Cardiovascular;  Laterality: N/A;  . TONSILLECTOMY  1940s  . TOTAL ABDOMINAL HYSTERECTOMY W/ BILATERAL SALPINGOOPHORECTOMY  2015   Abdominal sacrocolpopexy, tension-free vaginal tape Exact, cystoscopy   . TUBAL LIGATION    . WRIST FRACTURE SURGERY Left    plate inserted    Current Outpatient Medications  Medication Sig Dispense Refill  .  aspirin EC 81 MG tablet Take 81 mg by mouth daily.    . Azelastine HCl 0.15 % SOLN Place 1 spray as needed into both nostrils.    . B Complex Vitamins (VITAMIN-B COMPLEX PO) Take 1 capsule by mouth daily.     . Calcium-Vitamin D-Vitamin K (VIACTIV) 761-950-93 MG-UNT-MCG CHEW Chew 1 capsule by mouth 2 (two) times daily.     . Cholecalciferol (VITAMIN D) 2000 UNITS CAPS Take 2,000 Units by mouth daily.     . Coenzyme Q10 (COQ10 PO) Take by mouth.    . EPIPEN 2-PAK 0.3 MG/0.3ML SOAJ injection Inject 0.3 mg into the muscle as directed. Reported on 04/12/2015    . escitalopram (LEXAPRO) 10 MG tablet Take 5 mg by mouth. Takes 1 tablet twice weekly    . Flaxseed, Linseed, (FLAXSEED OIL PO) Take 1,400 mg by mouth daily.    . NON FORMULARY Inject 1 each into the muscle once a week. Allergy injections once weekly    . pravastatin (PRAVACHOL) 20 MG tablet Take 20 mg by mouth 2 (two) times a week.     . TURMERIC PO Take by mouth.    . VENTOLIN HFA 108 (90 BASE) MCG/ACT inhaler Inhale 1-2 puffs into the lungs daily as needed for wheezing or shortness of breath.     . Zoledronic Acid (RECLAST IV) Inject into the vein See admin instructions. Uses every other year     No current facility-administered medications for this visit.     Allergies:   Other; Codeine; Dilaudid [hydromorphone hcl]; and Morphine and related   Social History:  The patient  reports that she quit smoking about 30 years ago. Her smoking use included cigarettes. She has a 30.00 pack-year smoking history. She has never used smokeless tobacco. She reports current alcohol use. She reports that she does not use drugs.   Family History:  The patient's  family history includes Cancer in her brother; Heart attack in her father; Varicose Veins in her mother.   ROS:  Please see the history of present illness.   All other systems are personally reviewed and negative.    Exam:    Vital Signs:  BP - 135/87, P - 86, Wt. -136 lbs.  Well appearing,  alert and conversant, regular work of breathing,  good skin color Eyes- anicteric, neuro- grossly intact, skin- no apparent rash or lesions or cyanosis, mouth- oral mucosa is pink   Labs/Other Tests and Data Reviewed:    Recent Labs: No results found for requested labs within last 8760 hours.   Wt Readings from Last 3 Encounters:  06/28/17 142 lb 9.6 oz (64.7 kg)  05/17/17 140 lb (63.5 kg)  01/03/17 138 lb (62.6 kg)     Other studies personally reviewed:  Last device remote is reviewed from [facility] PDF dated 4/20 which reveals normal device function, no arrhythmias    ASSESSMENT & PLAN:    1.  Sinus node dysfunction - She is asymptomatic, s/p PPM insertion.  2. PPM -  Her biotronic DDD is working normally. No arrhythmias  3. HTN - her blood pressure is minimally elevated today. She will continue her current meds. 4. COVID 19 screen The patient denies symptoms of COVID 19 at this time.  The importance of social distancing was discussed today.  Follow-up:  1 year Next remote: 7/20  Current medicines are reviewed at length with the patient today.   The patient does not have concerns regarding her medicines.  The following changes were made today:  none  Labs/ tests ordered today include: none No orders of the defined types were placed in this encounter.    Patient Risk:  after full review of this patients clinical status, I feel that they are at moderate risk at this time.  Today, I have spent 15 minutes with the patient with telehealth technology discussing all of the above .    Signed, Cristopher Peru, MD  07/01/2018 1:44 PM     [facility] [address] [place] [address] [phone] (office) [phone] (fax)

## 2018-07-07 DIAGNOSIS — J3089 Other allergic rhinitis: Secondary | ICD-10-CM | POA: Diagnosis not present

## 2018-07-07 DIAGNOSIS — J301 Allergic rhinitis due to pollen: Secondary | ICD-10-CM | POA: Diagnosis not present

## 2018-07-10 DIAGNOSIS — E785 Hyperlipidemia, unspecified: Secondary | ICD-10-CM | POA: Diagnosis not present

## 2018-07-10 DIAGNOSIS — Z87891 Personal history of nicotine dependence: Secondary | ICD-10-CM | POA: Diagnosis not present

## 2018-07-10 DIAGNOSIS — Z7982 Long term (current) use of aspirin: Secondary | ICD-10-CM | POA: Diagnosis not present

## 2018-07-10 DIAGNOSIS — R69 Illness, unspecified: Secondary | ICD-10-CM | POA: Diagnosis not present

## 2018-07-10 DIAGNOSIS — I499 Cardiac arrhythmia, unspecified: Secondary | ICD-10-CM | POA: Diagnosis not present

## 2018-07-10 DIAGNOSIS — J3089 Other allergic rhinitis: Secondary | ICD-10-CM | POA: Diagnosis not present

## 2018-07-10 DIAGNOSIS — K219 Gastro-esophageal reflux disease without esophagitis: Secondary | ICD-10-CM | POA: Diagnosis not present

## 2018-07-10 DIAGNOSIS — Z823 Family history of stroke: Secondary | ICD-10-CM | POA: Diagnosis not present

## 2018-07-10 DIAGNOSIS — Z8249 Family history of ischemic heart disease and other diseases of the circulatory system: Secondary | ICD-10-CM | POA: Diagnosis not present

## 2018-07-10 DIAGNOSIS — M1990 Unspecified osteoarthritis, unspecified site: Secondary | ICD-10-CM | POA: Diagnosis not present

## 2018-07-10 DIAGNOSIS — J309 Allergic rhinitis, unspecified: Secondary | ICD-10-CM | POA: Diagnosis not present

## 2018-07-28 DIAGNOSIS — J3089 Other allergic rhinitis: Secondary | ICD-10-CM | POA: Diagnosis not present

## 2018-07-28 DIAGNOSIS — J301 Allergic rhinitis due to pollen: Secondary | ICD-10-CM | POA: Diagnosis not present

## 2018-07-30 DIAGNOSIS — L918 Other hypertrophic disorders of the skin: Secondary | ICD-10-CM | POA: Diagnosis not present

## 2018-07-30 DIAGNOSIS — L570 Actinic keratosis: Secondary | ICD-10-CM | POA: Diagnosis not present

## 2018-07-30 DIAGNOSIS — L821 Other seborrheic keratosis: Secondary | ICD-10-CM | POA: Diagnosis not present

## 2018-07-30 DIAGNOSIS — D1801 Hemangioma of skin and subcutaneous tissue: Secondary | ICD-10-CM | POA: Diagnosis not present

## 2018-07-30 DIAGNOSIS — D2372 Other benign neoplasm of skin of left lower limb, including hip: Secondary | ICD-10-CM | POA: Diagnosis not present

## 2018-07-30 DIAGNOSIS — J301 Allergic rhinitis due to pollen: Secondary | ICD-10-CM | POA: Diagnosis not present

## 2018-07-30 DIAGNOSIS — B078 Other viral warts: Secondary | ICD-10-CM | POA: Diagnosis not present

## 2018-07-30 DIAGNOSIS — J3089 Other allergic rhinitis: Secondary | ICD-10-CM | POA: Diagnosis not present

## 2018-08-05 DIAGNOSIS — J3089 Other allergic rhinitis: Secondary | ICD-10-CM | POA: Diagnosis not present

## 2018-08-07 DIAGNOSIS — J3089 Other allergic rhinitis: Secondary | ICD-10-CM | POA: Diagnosis not present

## 2018-08-07 DIAGNOSIS — R69 Illness, unspecified: Secondary | ICD-10-CM | POA: Diagnosis not present

## 2018-08-07 DIAGNOSIS — J301 Allergic rhinitis due to pollen: Secondary | ICD-10-CM | POA: Diagnosis not present

## 2018-08-13 DIAGNOSIS — J3089 Other allergic rhinitis: Secondary | ICD-10-CM | POA: Diagnosis not present

## 2018-08-20 DIAGNOSIS — J3089 Other allergic rhinitis: Secondary | ICD-10-CM | POA: Diagnosis not present

## 2018-08-20 DIAGNOSIS — J301 Allergic rhinitis due to pollen: Secondary | ICD-10-CM | POA: Diagnosis not present

## 2018-09-10 DIAGNOSIS — J3089 Other allergic rhinitis: Secondary | ICD-10-CM | POA: Diagnosis not present

## 2018-09-10 DIAGNOSIS — J301 Allergic rhinitis due to pollen: Secondary | ICD-10-CM | POA: Diagnosis not present

## 2018-09-17 ENCOUNTER — Ambulatory Visit (INDEPENDENT_AMBULATORY_CARE_PROVIDER_SITE_OTHER): Payer: Medicare HMO | Admitting: *Deleted

## 2018-09-17 DIAGNOSIS — I480 Paroxysmal atrial fibrillation: Secondary | ICD-10-CM

## 2018-09-17 DIAGNOSIS — I495 Sick sinus syndrome: Secondary | ICD-10-CM

## 2018-09-17 LAB — CUP PACEART REMOTE DEVICE CHECK
Date Time Interrogation Session: 20200722125753
Implantable Lead Implant Date: 20170420
Implantable Lead Implant Date: 20170420
Implantable Lead Location: 753859
Implantable Lead Location: 753860
Implantable Lead Model: 377
Implantable Lead Model: 377
Implantable Lead Serial Number: 49378160
Implantable Lead Serial Number: 49421706
Implantable Pulse Generator Implant Date: 20170420
Pulse Gen Model: 394969
Pulse Gen Serial Number: 68713503

## 2018-09-22 ENCOUNTER — Encounter: Payer: Self-pay | Admitting: Obstetrics & Gynecology

## 2018-09-22 ENCOUNTER — Ambulatory Visit (INDEPENDENT_AMBULATORY_CARE_PROVIDER_SITE_OTHER): Payer: Medicare HMO | Admitting: Obstetrics & Gynecology

## 2018-09-22 ENCOUNTER — Other Ambulatory Visit: Payer: Self-pay

## 2018-09-22 VITALS — BP 138/86 | HR 80 | Temp 97.5°F | Ht 62.0 in | Wt 139.0 lb

## 2018-09-22 DIAGNOSIS — Z01419 Encounter for gynecological examination (general) (routine) without abnormal findings: Secondary | ICD-10-CM

## 2018-09-22 DIAGNOSIS — Z7689 Persons encountering health services in other specified circumstances: Secondary | ICD-10-CM | POA: Diagnosis not present

## 2018-09-22 DIAGNOSIS — R151 Fecal smearing: Secondary | ICD-10-CM | POA: Diagnosis not present

## 2018-09-22 NOTE — Progress Notes (Signed)
78 y.o. G63P2002 Divorced White or Caucasian female here for annual exam.  Doing well.  Had telemedicine visit with Dr. Lovena Le 07/01/2018.    Denies vaginal bleeding.  Having some fecal incontinence.    PCP:  Dr. Joylene Draft.  Last appt was at the end of January.  Did not have reclast last year but to upcoming dental work.  Has BMD scheduled in a few weeks.    Patient's last menstrual period was 02/27/2000.          Sexually active: No.  The current method of family planning is status post hysterectomy.    Exercising: Yes.    walking Smoker:  no  Health Maintenance: Pap:  01/15/13 neg  History of abnormal Pap:  Yes, remote hx  MMG:  12/03/16 BIRADS1:neg  Colonoscopy:  2017 polyps. F/u 5 years  BMD:  2018 TDaP:  2017 Pneumonia vaccine(s):  Done  Shingrix:   Completed  Hep C testing: n/a Screening Labs: PCP   reports that she quit smoking about 30 years ago. Her smoking use included cigarettes. She has a 30.00 pack-year smoking history. She has never used smokeless tobacco. She reports current alcohol use. She reports that she does not use drugs.  Past Medical History:  Diagnosis Date  . Abnormal Pap smear of cervix   . Acute respiratory failure with hypoxia ([place])   . Asthma    "I'll have an attack if I'm around cat or dust"  . Asystole ([place])   . Atrial fibrillation ([place])   . CHB (complete heart block) ([place]) 05/2015   s/p Biotronik (serial number [id number]) dual lead pacemaker  . Depression   . Exercise-induced asthma   . Hx of colonic polyps   . Osteopenia   . PONV (postoperative nausea and vomiting)    pt. thinks it was from dilaudid  . Presence of permanent cardiac pacemaker     Past Surgical History:  Procedure Laterality Date  . ABDOMINAL HYSTERECTOMY N/A 07/28/2013   Procedure: HYSTERECTOMY ABDOMINAL with SALPINGO OOPHORECTOMY;  Surgeon: Lyman Speller, MD;  Location: [facility];  Service: Gynecology;  Laterality: N/A;  request 4.5 hours  . ABDOMINAL SACROCOLPOPEXY N/A  07/28/2013   Procedure: ABDOMINO SACROCOLPOPEXY;  Surgeon: Jamey Reas de Berton Lan, MD;  Location: [facility];  Service: Gynecology;  Laterality: N/A;  . APPENDECTOMY    . BLADDER SUSPENSION N/A 07/28/2013   Procedure: TRANSVAGINAL TAPE (TVT) PROCEDURE midurethral sling;  Surgeon: Everardo All Amundson de Berton Lan, MD;  Location: [facility];  Service: Gynecology;  Laterality: N/A;  . CHOLECYSTECTOMY OPEN    . CRYOTHERAPY     for abnormal pap smear yrs ago  . CYSTOSCOPY N/A 07/28/2013   Procedure: CYSTOSCOPY;  Surgeon: Jamey Reas de Berton Lan, MD;  Location: [facility];  Service: Gynecology;  Laterality: N/A;  . EP IMPLANTABLE DEVICE N/A 06/16/2015   Procedure: Pacemaker Implant;  Surgeon: Evans Lance, MD; Biotronik (serial number [id number]) pacemaker; Laterality: Left  . EP IMPLANTABLE DEVICE N/A 06/16/2015   Procedure: Loop Recorder Removal;  Surgeon: Evans Lance, MD;  Location: [facility];  Service: Cardiovascular;  Laterality: N/A;  . FRACTURE SURGERY    . INSERT / REPLACE / REMOVE PACEMAKER  06/16/2015  . LOOP RECORDER IMPLANT N/A 10/21/2013   Procedure: LOOP RECORDER IMPLANT;  Surgeon: Evans Lance, MD;  Location: [facility];  Service: Cardiovascular;  Laterality: N/A;  . LOOP RECORDER REMOVAL  06/16/2015  . TEMPORARY PACEMAKER INSERTION  N/A 07/29/2013   Procedure: TEMPORARY PACEMAKER INSERTION;  Surgeon: [place] Hampshire, MD;  Location: [facility];  Service: Cardiovascular;  Laterality: N/A;  . TONSILLECTOMY  1940s  . TOTAL ABDOMINAL HYSTERECTOMY W/ BILATERAL SALPINGOOPHORECTOMY  2015   Abdominal sacrocolpopexy, tension-free vaginal tape Exact, cystoscopy   . TUBAL LIGATION    . WRIST FRACTURE SURGERY Left    plate inserted    Current Outpatient Medications  Medication Sig Dispense Refill  . aspirin EC 81 MG tablet Take 81 mg by mouth daily.    . Azelastine HCl 0.15 % SOLN Place 1 spray as needed into both nostrils.    . B Complex Vitamins (VITAMIN-B  COMPLEX PO) Take 1 capsule by mouth daily.     . Calcium-Vitamin D-Vitamin K (VIACTIV) 740-814-48 MG-UNT-MCG CHEW Chew 1 capsule by mouth 2 (two) times daily.     . Cholecalciferol (VITAMIN D) 2000 UNITS CAPS Take 2,000 Units by mouth daily.     . Coenzyme Q10 (COQ10 PO) Take by mouth.    . EPIPEN 2-PAK 0.3 MG/0.3ML SOAJ injection Inject 0.3 mg into the muscle as directed. Reported on 04/12/2015    . escitalopram (LEXAPRO) 10 MG tablet Take 5 mg by mouth daily. Takes 1 tablet twice weekly    . Flaxseed, Linseed, (FLAXSEED OIL PO) Take 1,400 mg by mouth daily.    . NON FORMULARY Inject 1 each into the muscle once a week. Allergy injections once weekly    . pravastatin (PRAVACHOL) 20 MG tablet Take 20 mg by mouth 4 (four) times a week.     . TURMERIC PO Take by mouth.    . VENTOLIN HFA 108 (90 BASE) MCG/ACT inhaler Inhale 1-2 puffs into the lungs daily as needed for wheezing or shortness of breath.     . Zoledronic Acid (RECLAST IV) Inject into the vein See admin instructions. Uses every other year     No current facility-administered medications for this visit.     Family History  Problem Relation Age of Onset  . Varicose Veins Mother   . Heart attack Father   . Cancer Brother        colon cancer (polyps)    Review of Systems  All other systems reviewed and are negative.   Exam:   BP 138/86   Pulse 80   Temp (!) 97.5 F (36.4 C) (Temporal)   Ht 5\' 2"  (1.575 m)   Wt 139 lb (63 kg)   LMP 02/27/2000   BMI 25.42 kg/m   Height: 5\' 2"  (157.5 cm)  Ht Readings from Last 3 Encounters:  09/22/18 5\' 2"  (1.575 m)  06/28/17 5\' 2"  (1.575 m)  05/17/17 5' 2.5" (1.588 m)    General appearance: alert, cooperative and appears stated age Head: Normocephalic, without obvious abnormality, atraumatic Neck: no adenopathy, supple, symmetrical, trachea midline and thyroid normal to inspection and palpation Lungs: clear to auscultation bilaterally Breasts: normal appearance, no masses or  tenderness Heart: regular rate and rhythm Abdomen: soft, non-tender; bowel sounds normal; no masses,  no organomegaly Extremities: extremities normal, atraumatic, no cyanosis or edema Skin: Skin color, texture, turgor normal. No rashes or lesions Lymph nodes: Cervical, supraclavicular, and axillary nodes normal. No abnormal inguinal nodes palpated Neurologic: Grossly normal  Pelvic: External genitalia:  no lesions              Urethra:  normal appearing urethra with no masses, tenderness or lesions  Bartholins and Skenes: normal                 Vagina: normal appearing vagina with normal color and discharge, no lesions              Cervix: absent              Pap taken: No. Bimanual Exam:  Uterus:  uterus absent              Adnexa: no mass, fullness, tenderness               Rectovaginal: Confirms               Anus:  normal sphincter tone, no lesions  Chaperone was present for exam.  A:  Well Woman with normal exam PMP, no HRT H/o TAH/BSO, abdomino-sacrocolpopexy, cystoscopy 2016 Sinus node dysfunction Osteopenia.  On Reclast. H/O colon polyps  P:   Mammogram guidelines reviewed.  Doing 3D MMG. pap smear not indicated  Lab work and vaccines with Dr. Joylene Draft. BMD planned for late summer with Dr. Joylene Draft return annually or prn

## 2018-09-30 DIAGNOSIS — R69 Illness, unspecified: Secondary | ICD-10-CM | POA: Diagnosis not present

## 2018-10-01 DIAGNOSIS — J301 Allergic rhinitis due to pollen: Secondary | ICD-10-CM | POA: Diagnosis not present

## 2018-10-01 DIAGNOSIS — J3089 Other allergic rhinitis: Secondary | ICD-10-CM | POA: Diagnosis not present

## 2018-10-02 NOTE — Progress Notes (Signed)
Remote pacemaker transmission.   

## 2018-10-20 DIAGNOSIS — M6281 Muscle weakness (generalized): Secondary | ICD-10-CM | POA: Diagnosis not present

## 2018-10-20 DIAGNOSIS — R151 Fecal smearing: Secondary | ICD-10-CM | POA: Diagnosis not present

## 2018-10-20 DIAGNOSIS — M62838 Other muscle spasm: Secondary | ICD-10-CM | POA: Diagnosis not present

## 2018-10-20 DIAGNOSIS — M6289 Other specified disorders of muscle: Secondary | ICD-10-CM | POA: Diagnosis not present

## 2018-10-21 DIAGNOSIS — M6289 Other specified disorders of muscle: Secondary | ICD-10-CM | POA: Diagnosis not present

## 2018-10-21 DIAGNOSIS — M62838 Other muscle spasm: Secondary | ICD-10-CM | POA: Diagnosis not present

## 2018-10-21 DIAGNOSIS — R151 Fecal smearing: Secondary | ICD-10-CM | POA: Diagnosis not present

## 2018-10-21 DIAGNOSIS — M6281 Muscle weakness (generalized): Secondary | ICD-10-CM | POA: Diagnosis not present

## 2018-10-23 DIAGNOSIS — J301 Allergic rhinitis due to pollen: Secondary | ICD-10-CM | POA: Diagnosis not present

## 2018-10-23 DIAGNOSIS — J3089 Other allergic rhinitis: Secondary | ICD-10-CM | POA: Diagnosis not present

## 2018-10-28 DIAGNOSIS — R151 Fecal smearing: Secondary | ICD-10-CM | POA: Diagnosis not present

## 2018-10-28 DIAGNOSIS — M6289 Other specified disorders of muscle: Secondary | ICD-10-CM | POA: Diagnosis not present

## 2018-10-28 DIAGNOSIS — R3915 Urgency of urination: Secondary | ICD-10-CM | POA: Diagnosis not present

## 2018-10-28 DIAGNOSIS — M6281 Muscle weakness (generalized): Secondary | ICD-10-CM | POA: Diagnosis not present

## 2018-10-28 DIAGNOSIS — M62838 Other muscle spasm: Secondary | ICD-10-CM | POA: Diagnosis not present

## 2018-10-28 DIAGNOSIS — N3941 Urge incontinence: Secondary | ICD-10-CM | POA: Diagnosis not present

## 2018-11-06 DIAGNOSIS — J3089 Other allergic rhinitis: Secondary | ICD-10-CM | POA: Diagnosis not present

## 2018-11-06 DIAGNOSIS — J301 Allergic rhinitis due to pollen: Secondary | ICD-10-CM | POA: Diagnosis not present

## 2018-11-18 DIAGNOSIS — R3915 Urgency of urination: Secondary | ICD-10-CM | POA: Diagnosis not present

## 2018-11-18 DIAGNOSIS — M6281 Muscle weakness (generalized): Secondary | ICD-10-CM | POA: Diagnosis not present

## 2018-11-18 DIAGNOSIS — R151 Fecal smearing: Secondary | ICD-10-CM | POA: Diagnosis not present

## 2018-11-18 DIAGNOSIS — M6289 Other specified disorders of muscle: Secondary | ICD-10-CM | POA: Diagnosis not present

## 2018-11-18 DIAGNOSIS — M62838 Other muscle spasm: Secondary | ICD-10-CM | POA: Diagnosis not present

## 2018-11-18 DIAGNOSIS — N3941 Urge incontinence: Secondary | ICD-10-CM | POA: Diagnosis not present

## 2018-11-27 DIAGNOSIS — J301 Allergic rhinitis due to pollen: Secondary | ICD-10-CM | POA: Diagnosis not present

## 2018-11-27 DIAGNOSIS — J3089 Other allergic rhinitis: Secondary | ICD-10-CM | POA: Diagnosis not present

## 2018-11-30 DIAGNOSIS — R69 Illness, unspecified: Secondary | ICD-10-CM | POA: Diagnosis not present

## 2018-12-01 DIAGNOSIS — M6281 Muscle weakness (generalized): Secondary | ICD-10-CM | POA: Diagnosis not present

## 2018-12-01 DIAGNOSIS — M6289 Other specified disorders of muscle: Secondary | ICD-10-CM | POA: Diagnosis not present

## 2018-12-01 DIAGNOSIS — R151 Fecal smearing: Secondary | ICD-10-CM | POA: Diagnosis not present

## 2018-12-01 DIAGNOSIS — M62838 Other muscle spasm: Secondary | ICD-10-CM | POA: Diagnosis not present

## 2018-12-01 DIAGNOSIS — N3941 Urge incontinence: Secondary | ICD-10-CM | POA: Diagnosis not present

## 2018-12-01 DIAGNOSIS — R3915 Urgency of urination: Secondary | ICD-10-CM | POA: Diagnosis not present

## 2018-12-12 DIAGNOSIS — Z23 Encounter for immunization: Secondary | ICD-10-CM | POA: Diagnosis not present

## 2018-12-15 DIAGNOSIS — M6281 Muscle weakness (generalized): Secondary | ICD-10-CM | POA: Diagnosis not present

## 2018-12-15 DIAGNOSIS — R151 Fecal smearing: Secondary | ICD-10-CM | POA: Diagnosis not present

## 2018-12-15 DIAGNOSIS — N3941 Urge incontinence: Secondary | ICD-10-CM | POA: Diagnosis not present

## 2018-12-15 DIAGNOSIS — M62838 Other muscle spasm: Secondary | ICD-10-CM | POA: Diagnosis not present

## 2018-12-15 DIAGNOSIS — R3915 Urgency of urination: Secondary | ICD-10-CM | POA: Diagnosis not present

## 2018-12-15 DIAGNOSIS — M6289 Other specified disorders of muscle: Secondary | ICD-10-CM | POA: Diagnosis not present

## 2018-12-18 ENCOUNTER — Ambulatory Visit (INDEPENDENT_AMBULATORY_CARE_PROVIDER_SITE_OTHER): Payer: Medicare HMO | Admitting: *Deleted

## 2018-12-18 DIAGNOSIS — I480 Paroxysmal atrial fibrillation: Secondary | ICD-10-CM

## 2018-12-18 DIAGNOSIS — I495 Sick sinus syndrome: Secondary | ICD-10-CM

## 2018-12-18 DIAGNOSIS — J301 Allergic rhinitis due to pollen: Secondary | ICD-10-CM | POA: Diagnosis not present

## 2018-12-18 DIAGNOSIS — J3089 Other allergic rhinitis: Secondary | ICD-10-CM | POA: Diagnosis not present

## 2018-12-18 LAB — CUP PACEART REMOTE DEVICE CHECK
Battery Remaining Percentage: 75 %
Brady Statistic RA Percent Paced: 39 %
Brady Statistic RV Percent Paced: 0 %
Date Time Interrogation Session: 20201022165714
Implantable Lead Implant Date: 20170420
Implantable Lead Implant Date: 20170420
Implantable Lead Location: 753859
Implantable Lead Location: 753860
Implantable Lead Model: 377
Implantable Lead Model: 377
Implantable Lead Serial Number: 49378160
Implantable Lead Serial Number: 49421706
Implantable Pulse Generator Implant Date: 20170420
Lead Channel Impedance Value: 429 Ohm
Lead Channel Impedance Value: 605 Ohm
Lead Channel Setting Pacing Amplitude: 2 V
Lead Channel Setting Pacing Amplitude: 2.4 V
Lead Channel Setting Pacing Pulse Width: 0.4 ms
Pulse Gen Model: 394969
Pulse Gen Serial Number: 68713503

## 2018-12-22 ENCOUNTER — Encounter: Payer: Self-pay | Admitting: Obstetrics & Gynecology

## 2018-12-22 DIAGNOSIS — Z1231 Encounter for screening mammogram for malignant neoplasm of breast: Secondary | ICD-10-CM | POA: Diagnosis not present

## 2018-12-30 DIAGNOSIS — R3915 Urgency of urination: Secondary | ICD-10-CM | POA: Diagnosis not present

## 2018-12-30 DIAGNOSIS — M62838 Other muscle spasm: Secondary | ICD-10-CM | POA: Diagnosis not present

## 2018-12-30 DIAGNOSIS — M6281 Muscle weakness (generalized): Secondary | ICD-10-CM | POA: Diagnosis not present

## 2018-12-30 DIAGNOSIS — R151 Fecal smearing: Secondary | ICD-10-CM | POA: Diagnosis not present

## 2018-12-30 DIAGNOSIS — N3941 Urge incontinence: Secondary | ICD-10-CM | POA: Diagnosis not present

## 2018-12-30 DIAGNOSIS — M6289 Other specified disorders of muscle: Secondary | ICD-10-CM | POA: Diagnosis not present

## 2018-12-30 NOTE — Progress Notes (Signed)
Remote pacemaker transmission.   

## 2019-01-12 DIAGNOSIS — J3089 Other allergic rhinitis: Secondary | ICD-10-CM | POA: Diagnosis not present

## 2019-01-12 DIAGNOSIS — J301 Allergic rhinitis due to pollen: Secondary | ICD-10-CM | POA: Diagnosis not present

## 2019-01-21 DIAGNOSIS — M810 Age-related osteoporosis without current pathological fracture: Secondary | ICD-10-CM | POA: Diagnosis not present

## 2019-01-26 DIAGNOSIS — R151 Fecal smearing: Secondary | ICD-10-CM | POA: Diagnosis not present

## 2019-01-26 DIAGNOSIS — M6289 Other specified disorders of muscle: Secondary | ICD-10-CM | POA: Diagnosis not present

## 2019-01-26 DIAGNOSIS — N3941 Urge incontinence: Secondary | ICD-10-CM | POA: Diagnosis not present

## 2019-01-26 DIAGNOSIS — M62838 Other muscle spasm: Secondary | ICD-10-CM | POA: Diagnosis not present

## 2019-01-26 DIAGNOSIS — M6281 Muscle weakness (generalized): Secondary | ICD-10-CM | POA: Diagnosis not present

## 2019-02-02 DIAGNOSIS — J3089 Other allergic rhinitis: Secondary | ICD-10-CM | POA: Diagnosis not present

## 2019-02-02 DIAGNOSIS — J301 Allergic rhinitis due to pollen: Secondary | ICD-10-CM | POA: Diagnosis not present

## 2019-02-05 DIAGNOSIS — E7849 Other hyperlipidemia: Secondary | ICD-10-CM | POA: Diagnosis not present

## 2019-02-05 DIAGNOSIS — Z7689 Persons encountering health services in other specified circumstances: Secondary | ICD-10-CM | POA: Diagnosis not present

## 2019-02-05 DIAGNOSIS — M810 Age-related osteoporosis without current pathological fracture: Secondary | ICD-10-CM | POA: Diagnosis not present

## 2019-02-06 DIAGNOSIS — J301 Allergic rhinitis due to pollen: Secondary | ICD-10-CM | POA: Diagnosis not present

## 2019-02-24 DIAGNOSIS — J301 Allergic rhinitis due to pollen: Secondary | ICD-10-CM | POA: Diagnosis not present

## 2019-02-24 DIAGNOSIS — J3089 Other allergic rhinitis: Secondary | ICD-10-CM | POA: Diagnosis not present

## 2019-03-04 ENCOUNTER — Other Ambulatory Visit (HOSPITAL_COMMUNITY): Payer: Self-pay | Admitting: *Deleted

## 2019-03-05 ENCOUNTER — Ambulatory Visit (HOSPITAL_COMMUNITY)
Admission: RE | Admit: 2019-03-05 | Discharge: 2019-03-05 | Disposition: A | Discharge status: Home / Self Care | Payer: Medicare HMO | Source: Ambulatory Visit | Attending: Internal Medicine | Admitting: Internal Medicine

## 2019-03-05 ENCOUNTER — Other Ambulatory Visit: Payer: Self-pay

## 2019-03-05 DIAGNOSIS — M810 Age-related osteoporosis without current pathological fracture: Secondary | ICD-10-CM | POA: Diagnosis present

## 2019-03-05 DIAGNOSIS — R69 Illness, unspecified: Secondary | ICD-10-CM | POA: Diagnosis not present

## 2019-03-05 MED ORDER — ZOLEDRONIC ACID 5 MG/100ML IV SOLN: 5.0000 mg | Freq: Once | INTRAVENOUS | Status: AC

## 2019-03-05 MED ORDER — ZOLEDRONIC ACID 5 MG/100ML IV SOLN
INTRAVENOUS | Status: AC
  Administered 2019-03-05: 5 mg via INTRAVENOUS
  Filled 2019-03-05: qty 100

## 2019-03-13 ENCOUNTER — Ambulatory Visit: Payer: Medicare Other | Attending: Internal Medicine

## 2019-03-13 DIAGNOSIS — Z23 Encounter for immunization: Secondary | ICD-10-CM | POA: Insufficient documentation

## 2019-03-13 NOTE — Progress Notes (Signed)
   Covid-19 Vaccination Clinic  Name:  Danielle [facility]    MRN: [id number] DOB: 08-19-1940  03/13/2019  Danielle Lucas was observed post Covid-19 immunization for 30 minutes based on pre-vaccination screening without incidence. She was provided with Vaccine Information Sheet and instruction to access the V-Safe system.   Danielle Lucas was instructed to call 911 with any severe reactions post vaccine: Marland Kitchen Difficulty breathing  . Swelling of your face and throat  . A fast heartbeat  . A bad rash all over your body  . Dizziness and weakness    Immunizations Administered    Name Date Dose VIS Date Route   Pfizer COVID-19 Vaccine 03/13/2019 12:15 PM 0.3 mL 02/06/2019 Intramuscular   Manufacturer: [place]   Lot: [id number]   [place]: [id number]

## 2019-03-19 ENCOUNTER — Ambulatory Visit (INDEPENDENT_AMBULATORY_CARE_PROVIDER_SITE_OTHER): Payer: Medicare HMO | Admitting: *Deleted

## 2019-03-19 DIAGNOSIS — I495 Sick sinus syndrome: Secondary | ICD-10-CM | POA: Diagnosis not present

## 2019-03-19 LAB — CUP PACEART REMOTE DEVICE CHECK
Battery Remaining Percentage: 75 %
Date Time Interrogation Session: 20210121071145
Implantable Lead Implant Date: 20170420
Implantable Lead Implant Date: 20170420
Implantable Lead Location: 753859
Implantable Lead Location: 753860
Implantable Lead Model: 377
Implantable Lead Model: 377
Implantable Lead Serial Number: 49378160
Implantable Lead Serial Number: 49421706
Implantable Pulse Generator Implant Date: 20170420
Lead Channel Setting Pacing Amplitude: 2 V
Lead Channel Setting Pacing Amplitude: 2.4 V
Lead Channel Setting Pacing Pulse Width: 0.4 ms
Pulse Gen Model: 394969
Pulse Gen Serial Number: 68713503

## 2019-03-20 DIAGNOSIS — J301 Allergic rhinitis due to pollen: Secondary | ICD-10-CM | POA: Diagnosis not present

## 2019-03-20 DIAGNOSIS — J3089 Other allergic rhinitis: Secondary | ICD-10-CM | POA: Diagnosis not present

## 2019-03-26 DIAGNOSIS — J3089 Other allergic rhinitis: Secondary | ICD-10-CM | POA: Diagnosis not present

## 2019-03-26 DIAGNOSIS — J301 Allergic rhinitis due to pollen: Secondary | ICD-10-CM | POA: Diagnosis not present

## 2019-03-26 DIAGNOSIS — J4520 Mild intermittent asthma, uncomplicated: Secondary | ICD-10-CM | POA: Diagnosis not present

## 2019-03-26 DIAGNOSIS — J3081 Allergic rhinitis due to animal (cat) (dog) hair and dander: Secondary | ICD-10-CM | POA: Diagnosis not present

## 2019-03-30 DIAGNOSIS — M5442 Lumbago with sciatica, left side: Secondary | ICD-10-CM | POA: Diagnosis not present

## 2019-03-30 DIAGNOSIS — M9903 Segmental and somatic dysfunction of lumbar region: Secondary | ICD-10-CM | POA: Diagnosis not present

## 2019-04-01 DIAGNOSIS — M5442 Lumbago with sciatica, left side: Secondary | ICD-10-CM | POA: Diagnosis not present

## 2019-04-01 DIAGNOSIS — M9903 Segmental and somatic dysfunction of lumbar region: Secondary | ICD-10-CM | POA: Diagnosis not present

## 2019-04-03 ENCOUNTER — Ambulatory Visit: Payer: Medicare HMO | Attending: Internal Medicine

## 2019-04-03 DIAGNOSIS — Z23 Encounter for immunization: Secondary | ICD-10-CM | POA: Insufficient documentation

## 2019-04-03 NOTE — Progress Notes (Signed)
   Covid-19 Vaccination Clinic  Name:  Danielle Lucas    MRN: [id number] DOB: February 17, 1941  04/03/2019  [name] was observed post Covid-19 immunization for 15 minutes without incidence. She was provided with Vaccine Information Sheet and instruction to access the V-Safe system.   [name] was instructed to call 911 with any severe reactions post vaccine: Marland Kitchen Difficulty breathing  . Swelling of your face and throat  . A fast heartbeat  . A bad rash all over your body  . Dizziness and weakness    Immunizations Administered    Name Date Dose VIS Date Route   Pfizer COVID-19 Vaccine 04/03/2019 12:51 PM 0.3 mL 02/06/2019 Intramuscular   Manufacturer: [place]   Lot: [id number]   [place]: [id number]

## 2019-04-06 DIAGNOSIS — M5442 Lumbago with sciatica, left side: Secondary | ICD-10-CM | POA: Diagnosis not present

## 2019-04-06 DIAGNOSIS — M9903 Segmental and somatic dysfunction of lumbar region: Secondary | ICD-10-CM | POA: Diagnosis not present

## 2019-04-08 DIAGNOSIS — M5442 Lumbago with sciatica, left side: Secondary | ICD-10-CM | POA: Diagnosis not present

## 2019-04-08 DIAGNOSIS — M9903 Segmental and somatic dysfunction of lumbar region: Secondary | ICD-10-CM | POA: Diagnosis not present

## 2019-04-13 DIAGNOSIS — J301 Allergic rhinitis due to pollen: Secondary | ICD-10-CM | POA: Diagnosis not present

## 2019-04-13 DIAGNOSIS — J3089 Other allergic rhinitis: Secondary | ICD-10-CM | POA: Diagnosis not present

## 2019-04-13 DIAGNOSIS — M9903 Segmental and somatic dysfunction of lumbar region: Secondary | ICD-10-CM | POA: Diagnosis not present

## 2019-04-13 DIAGNOSIS — M5442 Lumbago with sciatica, left side: Secondary | ICD-10-CM | POA: Diagnosis not present

## 2019-04-15 DIAGNOSIS — M5442 Lumbago with sciatica, left side: Secondary | ICD-10-CM | POA: Diagnosis not present

## 2019-04-15 DIAGNOSIS — M9903 Segmental and somatic dysfunction of lumbar region: Secondary | ICD-10-CM | POA: Diagnosis not present

## 2019-04-20 DIAGNOSIS — M5442 Lumbago with sciatica, left side: Secondary | ICD-10-CM | POA: Diagnosis not present

## 2019-04-20 DIAGNOSIS — M9903 Segmental and somatic dysfunction of lumbar region: Secondary | ICD-10-CM | POA: Diagnosis not present

## 2019-04-21 DIAGNOSIS — R69 Illness, unspecified: Secondary | ICD-10-CM | POA: Diagnosis not present

## 2019-04-22 DIAGNOSIS — M5442 Lumbago with sciatica, left side: Secondary | ICD-10-CM | POA: Diagnosis not present

## 2019-04-22 DIAGNOSIS — M9903 Segmental and somatic dysfunction of lumbar region: Secondary | ICD-10-CM | POA: Diagnosis not present

## 2019-04-22 DIAGNOSIS — J301 Allergic rhinitis due to pollen: Secondary | ICD-10-CM | POA: Diagnosis not present

## 2019-04-22 DIAGNOSIS — J3081 Allergic rhinitis due to animal (cat) (dog) hair and dander: Secondary | ICD-10-CM | POA: Diagnosis not present

## 2019-04-22 DIAGNOSIS — J3089 Other allergic rhinitis: Secondary | ICD-10-CM | POA: Diagnosis not present

## 2019-04-22 DIAGNOSIS — J4520 Mild intermittent asthma, uncomplicated: Secondary | ICD-10-CM | POA: Diagnosis not present

## 2019-04-23 DIAGNOSIS — D6859 Other primary thrombophilia: Secondary | ICD-10-CM | POA: Insufficient documentation

## 2019-04-27 DIAGNOSIS — M9903 Segmental and somatic dysfunction of lumbar region: Secondary | ICD-10-CM | POA: Diagnosis not present

## 2019-04-27 DIAGNOSIS — M5442 Lumbago with sciatica, left side: Secondary | ICD-10-CM | POA: Diagnosis not present

## 2019-04-29 DIAGNOSIS — M5442 Lumbago with sciatica, left side: Secondary | ICD-10-CM | POA: Diagnosis not present

## 2019-04-29 DIAGNOSIS — M9903 Segmental and somatic dysfunction of lumbar region: Secondary | ICD-10-CM | POA: Diagnosis not present

## 2019-05-01 DIAGNOSIS — J301 Allergic rhinitis due to pollen: Secondary | ICD-10-CM | POA: Diagnosis not present

## 2019-05-01 DIAGNOSIS — J3089 Other allergic rhinitis: Secondary | ICD-10-CM | POA: Diagnosis not present

## 2019-05-04 DIAGNOSIS — Z0000 Encounter for general adult medical examination without abnormal findings: Secondary | ICD-10-CM | POA: Diagnosis not present

## 2019-05-04 DIAGNOSIS — M5442 Lumbago with sciatica, left side: Secondary | ICD-10-CM | POA: Diagnosis not present

## 2019-05-04 DIAGNOSIS — M810 Age-related osteoporosis without current pathological fracture: Secondary | ICD-10-CM | POA: Diagnosis not present

## 2019-05-04 DIAGNOSIS — M9903 Segmental and somatic dysfunction of lumbar region: Secondary | ICD-10-CM | POA: Diagnosis not present

## 2019-05-04 DIAGNOSIS — R7301 Impaired fasting glucose: Secondary | ICD-10-CM | POA: Diagnosis not present

## 2019-05-04 DIAGNOSIS — E7849 Other hyperlipidemia: Secondary | ICD-10-CM | POA: Diagnosis not present

## 2019-05-06 DIAGNOSIS — M5442 Lumbago with sciatica, left side: Secondary | ICD-10-CM | POA: Diagnosis not present

## 2019-05-06 DIAGNOSIS — M9903 Segmental and somatic dysfunction of lumbar region: Secondary | ICD-10-CM | POA: Diagnosis not present

## 2019-05-11 DIAGNOSIS — M25511 Pain in right shoulder: Secondary | ICD-10-CM | POA: Diagnosis not present

## 2019-05-11 DIAGNOSIS — Z1331 Encounter for screening for depression: Secondary | ICD-10-CM | POA: Diagnosis not present

## 2019-05-11 DIAGNOSIS — H9190 Unspecified hearing loss, unspecified ear: Secondary | ICD-10-CM | POA: Diagnosis not present

## 2019-05-11 DIAGNOSIS — M5430 Sciatica, unspecified side: Secondary | ICD-10-CM | POA: Diagnosis not present

## 2019-05-11 DIAGNOSIS — H9319 Tinnitus, unspecified ear: Secondary | ICD-10-CM | POA: Diagnosis not present

## 2019-05-11 DIAGNOSIS — Z950 Presence of cardiac pacemaker: Secondary | ICD-10-CM | POA: Diagnosis not present

## 2019-05-11 DIAGNOSIS — Z0000 Encounter for general adult medical examination without abnormal findings: Secondary | ICD-10-CM | POA: Diagnosis not present

## 2019-05-11 DIAGNOSIS — M5442 Lumbago with sciatica, left side: Secondary | ICD-10-CM | POA: Diagnosis not present

## 2019-05-11 DIAGNOSIS — D6869 Other thrombophilia: Secondary | ICD-10-CM | POA: Diagnosis not present

## 2019-05-11 DIAGNOSIS — M810 Age-related osteoporosis without current pathological fracture: Secondary | ICD-10-CM | POA: Diagnosis not present

## 2019-05-11 DIAGNOSIS — R69 Illness, unspecified: Secondary | ICD-10-CM | POA: Diagnosis not present

## 2019-05-11 DIAGNOSIS — Z1339 Encounter for screening examination for other mental health and behavioral disorders: Secondary | ICD-10-CM | POA: Diagnosis not present

## 2019-05-11 DIAGNOSIS — M9903 Segmental and somatic dysfunction of lumbar region: Secondary | ICD-10-CM | POA: Diagnosis not present

## 2019-05-11 DIAGNOSIS — E785 Hyperlipidemia, unspecified: Secondary | ICD-10-CM | POA: Diagnosis not present

## 2019-05-12 DIAGNOSIS — R82998 Other abnormal findings in urine: Secondary | ICD-10-CM | POA: Diagnosis not present

## 2019-05-22 DIAGNOSIS — Z1212 Encounter for screening for malignant neoplasm of rectum: Secondary | ICD-10-CM | POA: Diagnosis not present

## 2019-05-25 DIAGNOSIS — J3089 Other allergic rhinitis: Secondary | ICD-10-CM | POA: Diagnosis not present

## 2019-05-25 DIAGNOSIS — J301 Allergic rhinitis due to pollen: Secondary | ICD-10-CM | POA: Diagnosis not present

## 2019-05-27 DIAGNOSIS — J3089 Other allergic rhinitis: Secondary | ICD-10-CM | POA: Diagnosis not present

## 2019-06-01 DIAGNOSIS — J301 Allergic rhinitis due to pollen: Secondary | ICD-10-CM | POA: Diagnosis not present

## 2019-06-01 DIAGNOSIS — J3089 Other allergic rhinitis: Secondary | ICD-10-CM | POA: Diagnosis not present

## 2019-06-02 DIAGNOSIS — Z961 Presence of intraocular lens: Secondary | ICD-10-CM | POA: Diagnosis not present

## 2019-06-02 DIAGNOSIS — H0014 Chalazion left upper eyelid: Secondary | ICD-10-CM | POA: Diagnosis not present

## 2019-06-02 DIAGNOSIS — H524 Presbyopia: Secondary | ICD-10-CM | POA: Diagnosis not present

## 2019-06-03 DIAGNOSIS — M9903 Segmental and somatic dysfunction of lumbar region: Secondary | ICD-10-CM | POA: Diagnosis not present

## 2019-06-03 DIAGNOSIS — M5442 Lumbago with sciatica, left side: Secondary | ICD-10-CM | POA: Diagnosis not present

## 2019-06-05 DIAGNOSIS — J3089 Other allergic rhinitis: Secondary | ICD-10-CM | POA: Diagnosis not present

## 2019-06-08 DIAGNOSIS — J301 Allergic rhinitis due to pollen: Secondary | ICD-10-CM | POA: Diagnosis not present

## 2019-06-15 DIAGNOSIS — J3089 Other allergic rhinitis: Secondary | ICD-10-CM | POA: Diagnosis not present

## 2019-06-15 DIAGNOSIS — J301 Allergic rhinitis due to pollen: Secondary | ICD-10-CM | POA: Diagnosis not present

## 2019-06-17 LAB — CUP PACEART REMOTE DEVICE CHECK
Date Time Interrogation Session: 20210421123738
Implantable Lead Implant Date: 20170420
Implantable Lead Implant Date: 20170420
Implantable Lead Location: 753859
Implantable Lead Location: 753860
Implantable Lead Model: 377
Implantable Lead Model: 377
Implantable Lead Serial Number: 49378160
Implantable Lead Serial Number: 49421706
Implantable Pulse Generator Implant Date: 20170420
Pulse Gen Model: 394969
Pulse Gen Serial Number: 68713503

## 2019-06-18 ENCOUNTER — Ambulatory Visit (INDEPENDENT_AMBULATORY_CARE_PROVIDER_SITE_OTHER): Payer: Medicare HMO | Admitting: *Deleted

## 2019-06-18 DIAGNOSIS — I495 Sick sinus syndrome: Secondary | ICD-10-CM

## 2019-06-19 NOTE — Progress Notes (Signed)
PPM Remote  

## 2019-07-07 DIAGNOSIS — E7849 Other hyperlipidemia: Secondary | ICD-10-CM | POA: Diagnosis not present

## 2019-07-10 DIAGNOSIS — J3089 Other allergic rhinitis: Secondary | ICD-10-CM | POA: Diagnosis not present

## 2019-07-10 DIAGNOSIS — J301 Allergic rhinitis due to pollen: Secondary | ICD-10-CM | POA: Diagnosis not present

## 2019-07-31 DIAGNOSIS — J3089 Other allergic rhinitis: Secondary | ICD-10-CM | POA: Diagnosis not present

## 2019-07-31 DIAGNOSIS — J301 Allergic rhinitis due to pollen: Secondary | ICD-10-CM | POA: Diagnosis not present

## 2019-08-06 DIAGNOSIS — D1801 Hemangioma of skin and subcutaneous tissue: Secondary | ICD-10-CM | POA: Diagnosis not present

## 2019-08-06 DIAGNOSIS — L661 Lichen planopilaris: Secondary | ICD-10-CM | POA: Diagnosis not present

## 2019-08-06 DIAGNOSIS — L814 Other melanin hyperpigmentation: Secondary | ICD-10-CM | POA: Diagnosis not present

## 2019-08-06 DIAGNOSIS — L821 Other seborrheic keratosis: Secondary | ICD-10-CM | POA: Diagnosis not present

## 2019-08-06 DIAGNOSIS — D692 Other nonthrombocytopenic purpura: Secondary | ICD-10-CM | POA: Diagnosis not present

## 2019-08-06 DIAGNOSIS — L918 Other hypertrophic disorders of the skin: Secondary | ICD-10-CM | POA: Diagnosis not present

## 2019-08-06 DIAGNOSIS — B078 Other viral warts: Secondary | ICD-10-CM | POA: Diagnosis not present

## 2019-08-06 DIAGNOSIS — D2372 Other benign neoplasm of skin of left lower limb, including hip: Secondary | ICD-10-CM | POA: Diagnosis not present

## 2019-08-21 DIAGNOSIS — J3089 Other allergic rhinitis: Secondary | ICD-10-CM | POA: Diagnosis not present

## 2019-08-21 DIAGNOSIS — J301 Allergic rhinitis due to pollen: Secondary | ICD-10-CM | POA: Diagnosis not present

## 2019-09-08 DIAGNOSIS — R69 Illness, unspecified: Secondary | ICD-10-CM | POA: Diagnosis not present

## 2019-09-10 ENCOUNTER — Other Ambulatory Visit: Payer: Self-pay

## 2019-09-10 ENCOUNTER — Ambulatory Visit: Payer: Medicare HMO | Admitting: Internal Medicine

## 2019-09-10 ENCOUNTER — Encounter: Payer: Self-pay | Admitting: Internal Medicine

## 2019-09-10 VITALS — BP 122/78 | HR 80 | Ht 63.0 in | Wt 138.4 lb

## 2019-09-10 DIAGNOSIS — I495 Sick sinus syndrome: Secondary | ICD-10-CM

## 2019-09-10 DIAGNOSIS — Z950 Presence of cardiac pacemaker: Secondary | ICD-10-CM

## 2019-09-10 DIAGNOSIS — I480 Paroxysmal atrial fibrillation: Secondary | ICD-10-CM

## 2019-09-10 NOTE — Patient Instructions (Signed)
Medication Instructions:  Your physician recommends that you continue on your current medications as directed. Please refer to the Current Medication list given to you today.  Labwork: None ordered.  Testing/Procedures: None ordered.  Follow-Up: Your physician wants you to follow-up in: one year with Dr. Lovena Le.   You will receive a reminder letter in the mail two months in advance. If you don't receive a letter, please call our office to schedule the follow-up appointment.  Remote monitoring is used to monitor your Pacemaker from home. This monitoring reduces the number of office visits required to check your device to one time per year. It allows [place] to keep an eye on the functioning of your device to ensure it is working properly. You are scheduled for a device check from home on 09/17/2019. You may send your transmission at any time that day. If you have a wireless device, the transmission will be sent automatically. After your physician reviews your transmission, you will receive a postcard with your next transmission date.  Any Other Special Instructions Will Be Listed Below (If Applicable).  If you need a refill on your cardiac medications before your next appointment, please call your pharmacy.

## 2019-09-10 NOTE — Progress Notes (Signed)
HPI Danielle Lucas returns today for ongoing evaluation and management of sinus node dysfunction. She is s/p PPM insertion. She has PAF, HTN which has been well controlled. No syncope. When I saw her last, we noted that she had experienced brief episodes of atrial fib and atypical flutter and recommended an [place]. She decllined. She has had very little atrial fib since then. She does not have palpitations. She was started on valsartan for HTN.  Allergies  Allergen Reactions  . Other Anaphylaxis    Cat dander  . Codeine Nausea And Vomiting  . Dilaudid [Hydromorphone Hcl] Nausea And Vomiting  . Morphine And Related Nausea And Vomiting     Current Outpatient Medications  Medication Sig Dispense Refill  . aspirin EC 81 MG tablet Take 81 mg by mouth daily.    . Azelastine HCl 0.15 % SOLN Place 1 spray as needed into both nostrils.    . B Complex Vitamins (VITAMIN-B COMPLEX PO) Take 1 capsule by mouth daily.     . Calcium-Vitamin D-Vitamin K (VIACTIV) 536-644-03 MG-UNT-MCG CHEW Chew 1 capsule by mouth 2 (two) times daily.     . Cholecalciferol (VITAMIN D) 2000 UNITS CAPS Take 2,000 Units by mouth daily.     . Coenzyme Q10 (COQ10 PO) Take by mouth.    . EPIPEN 2-PAK 0.3 MG/0.3ML SOAJ injection Inject 0.3 mg into the muscle as directed. Reported on 04/12/2015    . escitalopram (LEXAPRO) 10 MG tablet Take 5 mg by mouth daily. Takes 1 tablet twice weekly    . Flaxseed, Linseed, (FLAXSEED OIL PO) Take 1,400 mg by mouth daily.    . NON FORMULARY Inject 1 each into the muscle once a week. Allergy injections once weekly    . pravastatin (PRAVACHOL) 20 MG tablet Take 20 mg by mouth 4 (four) times a week.     . TURMERIC PO Take by mouth.    . VENTOLIN HFA 108 (90 BASE) MCG/ACT inhaler Inhale 1-2 puffs into the lungs daily as needed for wheezing or shortness of breath.     . Zoledronic Acid (RECLAST IV) Inject into the vein See admin instructions. Uses every other year    . valsartan (DIOVAN) 80 MG  tablet Take 80 mg by mouth at bedtime.     No current facility-administered medications for this visit.     Past Medical History:  Diagnosis Date  . Abnormal Pap smear of cervix   . Acute respiratory failure with hypoxia ([place])   . Asthma    "I'll have an attack if I'm around cat or dust"  . Asystole ([place])   . Atrial fibrillation ([place])   . CHB (complete heart block) ([place]) 05/2015   s/p Biotronik (serial number [id number]) dual lead pacemaker  . Depression   . Exercise-induced asthma   . Hx of colonic polyps   . Osteopenia   . PONV (postoperative nausea and vomiting)    pt. thinks it was from dilaudid  . Presence of permanent cardiac pacemaker     ROS:   All systems reviewed and negative except as noted in the HPI.   Past Surgical History:  Procedure Laterality Date  . ABDOMINAL HYSTERECTOMY N/A 07/28/2013   Procedure: HYSTERECTOMY ABDOMINAL with SALPINGO OOPHORECTOMY;  Surgeon: Lyman Speller, MD;  Location: [facility];  Service: Gynecology;  Laterality: N/A;  request 4.5 hours  . ABDOMINAL SACROCOLPOPEXY N/A 07/28/2013   Procedure: ABDOMINO SACROCOLPOPEXY;  Surgeon: Jamey Reas de Berton Lan, MD;  Location: [facility];  Service: Gynecology;  Laterality: N/A;  . APPENDECTOMY    . BLADDER SUSPENSION N/A 07/28/2013   Procedure: TRANSVAGINAL TAPE (TVT) PROCEDURE midurethral sling;  Surgeon: Everardo All Amundson de Berton Lan, MD;  Location: [facility];  Service: Gynecology;  Laterality: N/A;  . CHOLECYSTECTOMY OPEN    . CRYOTHERAPY     for abnormal pap smear yrs ago  . CYSTOSCOPY N/A 07/28/2013   Procedure: CYSTOSCOPY;  Surgeon: Jamey Reas de Berton Lan, MD;  Location: [facility];  Service: Gynecology;  Laterality: N/A;  . EP IMPLANTABLE DEVICE N/A 06/16/2015   Procedure: Pacemaker Implant;  Surgeon: Evans Lance, MD; Biotronik (serial number [id number]) pacemaker; Laterality: Left  . EP IMPLANTABLE DEVICE N/A 06/16/2015   Procedure: Loop Recorder Removal;  Surgeon: Evans Lance, MD;  Location: [facility];  Service: Cardiovascular;  Laterality: N/A;  . FRACTURE SURGERY    . INSERT / REPLACE / REMOVE PACEMAKER  06/16/2015  . LOOP RECORDER IMPLANT N/A 10/21/2013   Procedure: LOOP RECORDER IMPLANT;  Surgeon: Evans Lance, MD;  Location: [facility];  Service: Cardiovascular;  Laterality: N/A;  . LOOP RECORDER REMOVAL  06/16/2015  . TEMPORARY PACEMAKER INSERTION N/A 07/29/2013   Procedure: TEMPORARY PACEMAKER INSERTION;  Surgeon: Wellington Hampshire, MD;  Location: [facility];  Service: Cardiovascular;  Laterality: N/A;  . TONSILLECTOMY  1940s  . TOTAL ABDOMINAL HYSTERECTOMY W/ BILATERAL SALPINGOOPHORECTOMY  2015   Abdominal sacrocolpopexy, tension-free vaginal tape Exact, cystoscopy   . TUBAL LIGATION    . WRIST FRACTURE SURGERY Left    plate inserted     Family History  Problem Relation Age of Onset  . Varicose Veins Mother   . Heart attack Father   . Cancer Brother        colon cancer (polyps)     Social History   Socioeconomic History  . Marital status: Divorced    Spouse name: Not on file  . Number of children: Not on file  . Years of education: Not on file  . Highest education level: Not on file  Occupational History  . Not on file  Tobacco Use  . Smoking status: Former Smoker    Packs/day: 1.00    Years: 30.00    Pack years: 30.00    Types: Cigarettes    Quit date: 07/02/1988    Years since quitting: 31.2  . Smokeless tobacco: Never Used  Vaping Use  . Vaping Use: Never used  Substance and Sexual Activity  . Alcohol use: Yes    Alcohol/week: 0.0 - 1.0 standard drinks  . Drug use: No  . Sexual activity: Not Currently    Partners: Male    Birth control/protection: Post-menopausal, Surgical    Comment: TAH/BSO  Other Topics Concern  . Not on file  Social History Narrative  . Not on file   Social Determinants of Health   Financial Resource Strain:   . Difficulty of Paying Living Expenses:   Food Insecurity:   .  Worried About Charity fundraiser in the Last Year:   . Arboriculturist in the Last Year:   Transportation Needs:   . Film/video editor (Medical):   Marland Kitchen Lack of Transportation (Non-Medical):   Physical Activity:   . Days of Exercise per Week:   . Minutes of Exercise per Session:   Stress:   . Feeling of Stress :   Social Connections:   . Frequency of  Communication with Friends and Family:   . Frequency of Social Gatherings with Friends and Family:   . Attends Religious Services:   . Active Member of Clubs or Organizations:   . Attends Archivist Meetings:   Marland Kitchen Marital Status:   Intimate Partner Violence:   . Fear of Current or Ex-Partner:   . Emotionally Abused:   Marland Kitchen Physically Abused:   . Sexually Abused:      BP 122/78   Pulse 80   Ht 5\' 3"  (1.6 m)   Wt 138 lb 6.4 oz (62.8 kg)   LMP 02/27/2000   SpO2 95%   BMI 24.52 kg/m   Physical Exam:  Well appearing 79 yo woman, NAD HEENT: Unremarkable Neck:  6 cm JVD, no thyromegally Lymphatics:  No adenopathy Back:  No CVA tenderness Lungs:  Clear with no wheezes HEART:  Regular rate rhythm, no murmurs, no rubs, no clicks Abd:  soft, positive bowel sounds, no organomegally, no rebound, no guarding Ext:  2 plus pulses, no edema, no cyanosis, no clubbing Skin:  No rashes no nodules Neuro:  CN II through XII intact, motor grossly intact  EKG - NSR with atrial pacing  DEVICE  Normal device function.  See PaceArt for details.   Assess/Plan: 1. Sinus node dysfunction - she appears to be doing well, s/p PPM insertion and is asymptomatic. 2. PPM - her Biotronik DDD PM is working normally. CLS is on. 3. HTN - her bp is well controlled. She will continue her current meds. 4. PAF - she has had minimal amounts of atrial fib on her PM interrogation. However, I still recommend an [place]. She declines.  Mikle Bosworth.D.

## 2019-09-17 ENCOUNTER — Ambulatory Visit (INDEPENDENT_AMBULATORY_CARE_PROVIDER_SITE_OTHER): Payer: Medicare HMO | Admitting: *Deleted

## 2019-09-17 DIAGNOSIS — I480 Paroxysmal atrial fibrillation: Secondary | ICD-10-CM | POA: Diagnosis not present

## 2019-09-17 LAB — CUP PACEART REMOTE DEVICE CHECK
Battery Remaining Percentage: 75 %
Brady Statistic RA Percent Paced: 44 %
Brady Statistic RV Percent Paced: 0 %
Date Time Interrogation Session: 20210721154040
Implantable Lead Implant Date: 20170420
Implantable Lead Implant Date: 20170420
Implantable Lead Location: 753859
Implantable Lead Location: 753860
Implantable Lead Model: 377
Implantable Lead Model: 377
Implantable Lead Serial Number: 49378160
Implantable Lead Serial Number: 49421706
Implantable Pulse Generator Implant Date: 20170420
Lead Channel Impedance Value: 429 Ohm
Lead Channel Impedance Value: 624 Ohm
Lead Channel Pacing Threshold Amplitude: 0.9 V
Lead Channel Pacing Threshold Amplitude: 1 V
Lead Channel Pacing Threshold Pulse Width: 0.4 ms
Lead Channel Pacing Threshold Pulse Width: 0.4 ms
Lead Channel Sensing Intrinsic Amplitude: 10.2 mV
Lead Channel Sensing Intrinsic Amplitude: 3.4 mV
Lead Channel Setting Pacing Amplitude: 2 V
Lead Channel Setting Pacing Amplitude: 2.4 V
Lead Channel Setting Pacing Pulse Width: 0.4 ms
Pulse Gen Model: 394969
Pulse Gen Serial Number: 68713503

## 2019-09-18 DIAGNOSIS — J3081 Allergic rhinitis due to animal (cat) (dog) hair and dander: Secondary | ICD-10-CM | POA: Diagnosis not present

## 2019-09-18 DIAGNOSIS — J3089 Other allergic rhinitis: Secondary | ICD-10-CM | POA: Diagnosis not present

## 2019-09-18 DIAGNOSIS — J301 Allergic rhinitis due to pollen: Secondary | ICD-10-CM | POA: Diagnosis not present

## 2019-09-21 NOTE — Progress Notes (Signed)
Remote pacemaker transmission.   

## 2019-10-09 DIAGNOSIS — J301 Allergic rhinitis due to pollen: Secondary | ICD-10-CM | POA: Diagnosis not present

## 2019-10-09 DIAGNOSIS — M1990 Unspecified osteoarthritis, unspecified site: Secondary | ICD-10-CM | POA: Diagnosis not present

## 2019-10-09 DIAGNOSIS — K219 Gastro-esophageal reflux disease without esophagitis: Secondary | ICD-10-CM | POA: Diagnosis not present

## 2019-10-09 DIAGNOSIS — I4891 Unspecified atrial fibrillation: Secondary | ICD-10-CM | POA: Diagnosis not present

## 2019-10-09 DIAGNOSIS — I10 Essential (primary) hypertension: Secondary | ICD-10-CM | POA: Diagnosis not present

## 2019-10-09 DIAGNOSIS — E785 Hyperlipidemia, unspecified: Secondary | ICD-10-CM | POA: Diagnosis not present

## 2019-10-09 DIAGNOSIS — I495 Sick sinus syndrome: Secondary | ICD-10-CM | POA: Diagnosis not present

## 2019-10-09 DIAGNOSIS — J45909 Unspecified asthma, uncomplicated: Secondary | ICD-10-CM | POA: Diagnosis not present

## 2019-10-09 DIAGNOSIS — R69 Illness, unspecified: Secondary | ICD-10-CM | POA: Diagnosis not present

## 2019-10-09 DIAGNOSIS — D6869 Other thrombophilia: Secondary | ICD-10-CM | POA: Diagnosis not present

## 2019-10-09 DIAGNOSIS — J3089 Other allergic rhinitis: Secondary | ICD-10-CM | POA: Diagnosis not present

## 2019-10-09 DIAGNOSIS — L659 Nonscarring hair loss, unspecified: Secondary | ICD-10-CM | POA: Diagnosis not present

## 2019-10-09 DIAGNOSIS — Z008 Encounter for other general examination: Secondary | ICD-10-CM | POA: Diagnosis not present

## 2019-10-12 DIAGNOSIS — J3089 Other allergic rhinitis: Secondary | ICD-10-CM | POA: Diagnosis not present

## 2019-10-16 DIAGNOSIS — J301 Allergic rhinitis due to pollen: Secondary | ICD-10-CM | POA: Diagnosis not present

## 2019-10-16 DIAGNOSIS — J3089 Other allergic rhinitis: Secondary | ICD-10-CM | POA: Diagnosis not present

## 2019-10-21 DIAGNOSIS — J3089 Other allergic rhinitis: Secondary | ICD-10-CM | POA: Diagnosis not present

## 2019-10-23 DIAGNOSIS — J301 Allergic rhinitis due to pollen: Secondary | ICD-10-CM | POA: Diagnosis not present

## 2019-10-23 DIAGNOSIS — J3089 Other allergic rhinitis: Secondary | ICD-10-CM | POA: Diagnosis not present

## 2019-10-23 DIAGNOSIS — J3081 Allergic rhinitis due to animal (cat) (dog) hair and dander: Secondary | ICD-10-CM | POA: Diagnosis not present

## 2019-11-05 ENCOUNTER — Encounter (HOSPITAL_BASED_OUTPATIENT_CLINIC_OR_DEPARTMENT_OTHER): Payer: Self-pay | Admitting: Emergency Medicine

## 2019-11-05 ENCOUNTER — Observation Stay (HOSPITAL_BASED_OUTPATIENT_CLINIC_OR_DEPARTMENT_OTHER)
Admission: EM | Admit: 2019-11-05 | Discharge: 2019-11-06 | Disposition: A | Discharge status: Home / Self Care | Payer: Medicare HMO | Attending: Internal Medicine | Admitting: Internal Medicine

## 2019-11-05 ENCOUNTER — Emergency Department (HOSPITAL_BASED_OUTPATIENT_CLINIC_OR_DEPARTMENT_OTHER): Payer: Medicare HMO

## 2019-11-05 ENCOUNTER — Other Ambulatory Visit: Payer: Self-pay

## 2019-11-05 DIAGNOSIS — I495 Sick sinus syndrome: Secondary | ICD-10-CM | POA: Diagnosis not present

## 2019-11-05 DIAGNOSIS — Z7901 Long term (current) use of anticoagulants: Secondary | ICD-10-CM | POA: Diagnosis not present

## 2019-11-05 DIAGNOSIS — I442 Atrioventricular block, complete: Secondary | ICD-10-CM | POA: Diagnosis not present

## 2019-11-05 DIAGNOSIS — I4729 Other ventricular tachycardia: Secondary | ICD-10-CM

## 2019-11-05 DIAGNOSIS — I472 Ventricular tachycardia: Secondary | ICD-10-CM

## 2019-11-05 DIAGNOSIS — I10 Essential (primary) hypertension: Secondary | ICD-10-CM | POA: Diagnosis not present

## 2019-11-05 DIAGNOSIS — J45909 Unspecified asthma, uncomplicated: Secondary | ICD-10-CM | POA: Insufficient documentation

## 2019-11-05 DIAGNOSIS — I4891 Unspecified atrial fibrillation: Principal | ICD-10-CM | POA: Diagnosis present

## 2019-11-05 DIAGNOSIS — Z87891 Personal history of nicotine dependence: Secondary | ICD-10-CM | POA: Insufficient documentation

## 2019-11-05 DIAGNOSIS — R5383 Other fatigue: Secondary | ICD-10-CM | POA: Diagnosis not present

## 2019-11-05 DIAGNOSIS — Z79899 Other long term (current) drug therapy: Secondary | ICD-10-CM | POA: Insufficient documentation

## 2019-11-05 DIAGNOSIS — R0602 Shortness of breath: Secondary | ICD-10-CM | POA: Diagnosis present

## 2019-11-05 DIAGNOSIS — Z950 Presence of cardiac pacemaker: Secondary | ICD-10-CM | POA: Diagnosis present

## 2019-11-05 DIAGNOSIS — Z7982 Long term (current) use of aspirin: Secondary | ICD-10-CM | POA: Diagnosis not present

## 2019-11-05 DIAGNOSIS — I469 Cardiac arrest, cause unspecified: Secondary | ICD-10-CM | POA: Diagnosis present

## 2019-11-05 HISTORY — DX: Essential (primary) hypertension: I10

## 2019-11-05 LAB — CBC WITH DIFFERENTIAL/PLATELET
Abs Immature Granulocytes: 0.03 10*3/uL (ref 0.00–0.07)
Basophils Absolute: 0.2 10*3/uL — ABNORMAL HIGH (ref 0.0–0.1)
Basophils Relative: 2 %
Eosinophils Absolute: 0.3 10*3/uL (ref 0.0–0.5)
Eosinophils Relative: 3 %
HCT: 38.2 % (ref 36.0–46.0)
Hemoglobin: 12.4 g/dL (ref 12.0–15.0)
Immature Granulocytes: 0 %
Lymphocytes Relative: 21 %
Lymphs Abs: 1.8 10*3/uL (ref 0.7–4.0)
MCH: 29.2 pg (ref 26.0–34.0)
MCHC: 32.5 g/dL (ref 30.0–36.0)
MCV: 89.9 fL (ref 80.0–100.0)
Monocytes Absolute: 0.6 10*3/uL (ref 0.1–1.0)
Monocytes Relative: 8 %
Neutro Abs: 5.4 10*3/uL (ref 1.7–7.7)
Neutrophils Relative %: 66 %
Platelets: 326 10*3/uL (ref 150–400)
RBC: 4.25 MIL/uL (ref 3.87–5.11)
RDW: 13.9 % (ref 11.5–15.5)
WBC: 8.2 10*3/uL (ref 4.0–10.5)
nRBC: 0 % (ref 0.0–0.2)

## 2019-11-05 LAB — BASIC METABOLIC PANEL
Anion gap: 13 (ref 5–15)
BUN: 22 mg/dL (ref 8–23)
CO2: 22 mmol/L (ref 22–32)
Calcium: 10.1 mg/dL (ref 8.9–10.3)
Chloride: 100 mmol/L (ref 98–111)
Creatinine, Ser: 0.93 mg/dL (ref 0.44–1.00)
GFR calc Af Amer: >60 mL/min (ref >60)
GFR calc non Af Amer: 59 mL/min — ABNORMAL LOW (ref >60)
Glucose, Bld: 114 mg/dL — ABNORMAL HIGH (ref 70–99)
Potassium: 4.2 mmol/L (ref 3.5–5.1)
Sodium: 135 mmol/L (ref 135–145)

## 2019-11-05 LAB — MAGNESIUM: Magnesium: 1.8 mg/dL (ref 1.7–2.4)

## 2019-11-05 LAB — TROPONIN I (HIGH SENSITIVITY)
Troponin I (High Sensitivity): 11 ng/L (ref <18)
Troponin I (High Sensitivity): 19 ng/L — ABNORMAL HIGH (ref <18)

## 2019-11-05 LAB — TSH: TSH: 2.65 u[IU]/mL (ref 0.350–4.500)

## 2019-11-05 MED ORDER — SODIUM CHLORIDE 0.9 % IV BOLUS
1000.0000 mL | Freq: Once | INTRAVENOUS | Status: AC
  Administered 2019-11-05: 1000 mL via INTRAVENOUS

## 2019-11-05 MED ORDER — METOPROLOL TARTRATE 5 MG/5ML IV SOLN
5.0000 mg | Freq: Once | INTRAVENOUS | Status: AC
  Administered 2019-11-05: 5 mg via INTRAVENOUS
  Filled 2019-11-05: qty 5

## 2019-11-05 MED ORDER — ESCITALOPRAM OXALATE 10 MG PO TABS
5.0000 mg | ORAL_TABLET | Freq: Every day | ORAL | Status: DC
  Administered 2019-11-06: 5 mg via ORAL
  Filled 2019-11-05: qty 1

## 2019-11-05 MED ORDER — SODIUM CHLORIDE 0.9 % IV SOLN: INTRAVENOUS | Status: DC | PRN

## 2019-11-05 MED ORDER — APIXABAN 5 MG PO TABS
5.0000 mg | ORAL_TABLET | Freq: Two times a day (BID) | ORAL | Status: DC
  Administered 2019-11-05 – 2019-11-06 (×2): 5 mg via ORAL
  Filled 2019-11-05 (×2): qty 1

## 2019-11-05 MED ORDER — NITROGLYCERIN 0.4 MG SL SUBL: 0.4000 mg | SUBLINGUAL_TABLET | SUBLINGUAL | Status: DC | PRN

## 2019-11-05 MED ORDER — MAGNESIUM SULFATE 2 GM/50ML IV SOLN
2.0000 g | Freq: Once | INTRAVENOUS | Status: AC
  Administered 2019-11-05: 2 g via INTRAVENOUS
  Filled 2019-11-05: qty 50

## 2019-11-05 MED ORDER — PRAVASTATIN SODIUM 10 MG PO TABS
20.0000 mg | ORAL_TABLET | ORAL | Status: DC
  Administered 2019-11-05: 20 mg via ORAL
  Filled 2019-11-05: qty 2

## 2019-11-05 MED ORDER — ACETAMINOPHEN 325 MG PO TABS: 650.0000 mg | ORAL_TABLET | ORAL | Status: DC | PRN

## 2019-11-05 MED ORDER — METOPROLOL SUCCINATE ER 25 MG PO TB24
25.0000 mg | ORAL_TABLET | Freq: Every day | ORAL | Status: DC
  Administered 2019-11-05 – 2019-11-06 (×2): 25 mg via ORAL
  Filled 2019-11-05 (×2): qty 1

## 2019-11-05 MED ORDER — ONDANSETRON HCL 4 MG/2ML IJ SOLN: 4.0000 mg | Freq: Four times a day (QID) | INTRAMUSCULAR | Status: DC | PRN

## 2019-11-05 MED ORDER — ALBUTEROL SULFATE (2.5 MG/3ML) 0.083% IN NEBU: 3.0000 mL | INHALATION_SOLUTION | Freq: Every day | RESPIRATORY_TRACT | Status: DC | PRN

## 2019-11-05 NOTE — ED Notes (Signed)
[employer] called for pacemaker interrogation.  The representative will call back.

## 2019-11-05 NOTE — ED Notes (Signed)
Pt has a Biotronic pacemaker. Charge Rn contacted the company about interrogating it.

## 2019-11-05 NOTE — ED Provider Notes (Signed)
[name] EMERGENCY DEPARTMENT Provider Note   CSN: [id number] Arrival date & time: 11/05/19  1130     History Chief Complaint  Patient presents with  . Shortness of Breath  . Dizziness    Danielle Lucas is a 79 y.o. female.  79 yo F with a cc of feeling lightheaded.  The patient went for a walk this morning.  She went for a hike with some friends and had some stairs and had some hills that are little bit more strenuous than her typical.  She usually could be able to complete it go without stopping but had to stop multiple times.  Prior to going walking she felt well.  Denies any symptoms yesterday.  Denies cough congestion or fever denies abdominal pain.  Denies nausea vomiting or diarrhea denies dark stool or blood in her stool denies any new medications.  She has changed her diet recently to try and be more plant than animal focused.  She feels that she is decreased her protein amount somewhat.  Denies any stimulant use.  Denies history of MI.  Denies any chest pain or pressure with this.  The history is provided by the patient.  Shortness of Breath Severity:  Moderate Onset quality:  Gradual Duration:  2 hours Timing:  Constant Progression:  Unchanged Chronicity:  New Relieved by:  Nothing Worsened by:  Nothing Ineffective treatments:  None tried Associated symptoms: no chest pain, no fever, no headaches, no vomiting and no wheezing   Dizziness Associated symptoms: shortness of breath   Associated symptoms: no chest pain, no headaches, no nausea, no palpitations and no vomiting        Past Medical History:  Diagnosis Date  . Abnormal Pap smear of cervix   . Acute respiratory failure with hypoxia ([place])   . Asthma    "I'll have an attack if I'm around cat or dust"  . Asystole ([place])   . Atrial fibrillation ([place])   . CHB (complete heart block) ([place]) 05/2015   s/p Biotronik (serial number [id number]) dual lead pacemaker  . Depression   . Exercise-induced  asthma   . Hx of colonic polyps   . Hypertension   . Osteopenia   . PONV (postoperative nausea and vomiting)    pt. thinks it was from dilaudid  . Presence of permanent cardiac pacemaker     Patient Active Problem List   Diagnosis Date Noted  . Non-sustained ventricular tachycardia ([place]) 11/05/2019  . Pacemaker 09/10/2019  . Sinus node dysfunction ([place]) 06/16/2015  . Paroxysmal atrial fibrillation ([place]) 08/26/2013  . History of atrial fibrillation 08/24/2013  . Vagally Mediated Asystole 07/29/2013  . Acute respiratory failure ([place]) 07/29/2013    Past Surgical History:  Procedure Laterality Date  . ABDOMINAL HYSTERECTOMY N/A 07/28/2013   Procedure: HYSTERECTOMY ABDOMINAL with SALPINGO OOPHORECTOMY;  Surgeon: Lyman Speller, MD;  Location: [facility];  Service: Gynecology;  Laterality: N/A;  request 4.5 hours  . ABDOMINAL SACROCOLPOPEXY N/A 07/28/2013   Procedure: ABDOMINO SACROCOLPOPEXY;  Surgeon: Jamey Reas de Berton Lan, MD;  Location: [facility];  Service: Gynecology;  Laterality: N/A;  . APPENDECTOMY    . BLADDER SUSPENSION N/A 07/28/2013   Procedure: TRANSVAGINAL TAPE (TVT) PROCEDURE midurethral sling;  Surgeon: Everardo All Amundson de Berton Lan, MD;  Location: [facility];  Service: Gynecology;  Laterality: N/A;  . CHOLECYSTECTOMY OPEN    . CRYOTHERAPY     for abnormal pap smear yrs ago  . CYSTOSCOPY N/A 07/28/2013  Procedure: CYSTOSCOPY;  Surgeon: Jamey Reas de Berton Lan, MD;  Location: [facility];  Service: Gynecology;  Laterality: N/A;  . EP IMPLANTABLE DEVICE N/A 06/16/2015   Procedure: Pacemaker Implant;  Surgeon: Evans Lance, MD; Biotronik (serial number [id number]) pacemaker; Laterality: Left  . EP IMPLANTABLE DEVICE N/A 06/16/2015   Procedure: Loop Recorder Removal;  Surgeon: Evans Lance, MD;  Location: [facility];  Service: Cardiovascular;  Laterality: N/A;  . FRACTURE SURGERY    . INSERT / REPLACE / REMOVE PACEMAKER  06/16/2015  . LOOP RECORDER  IMPLANT N/A 10/21/2013   Procedure: LOOP RECORDER IMPLANT;  Surgeon: Evans Lance, MD;  [facility] C[facility] LAB;  Service: Cardiovascular;  Laterality: N/A;  . LOOP RECORDER REMOVAL  06/16/2015  . TEMPORARY PACEMAKER INSERTION N/A 07/29/2013   Procedure: TEMPORARY PACEMAKER INSERTION;  Surgeon: Wellington Hampshire, MD;  Location: [facility] CATH LAB;  Service: Cardiovascular;  Laterality: N/A;  . TONSILLECTOMY  1940s  . TOTAL ABDOMINAL HYSTERECTOMY W/ BILATERAL SALPINGOOPHORECTOMY  2015   Abdominal sacrocolpopexy, tension-free vaginal tape Exact, cystoscopy   . TUBAL LIGATION    . WRIST FRACTURE SURGERY Left    plate inserted     OB History    Gravida  2   Para  2   Term  2   Preterm  0   AB  0   Living  2     SAB  0   TAB  0   Ectopic  0   Multiple      Live Births  2           Family History  Problem Relation Age of Onset  . Varicose Veins Mother   . Heart attack Father   . Cancer Brother        colon cancer (polyps)    Social History   Tobacco Use  . Smoking status: Former Smoker    Packs/day: 1.00    Years: 30.00    Pack years: 30.00    Types: Cigarettes    Quit date: 07/02/1988    Years since quitting: 31.3  . Smokeless tobacco: Never Used  Vaping Use  . Vaping Use: Never used  Substance Use Topics  . Alcohol use: Yes    Alcohol/week: 0.0 - 1.0 standard drinks  . Drug use: No    Home Medications Prior to Admission medications   Medication Sig Start Date End Date Taking? Authorizing Provider  valsartan (DIOVAN) 80 MG tablet Take 80 mg by mouth daily.   Yes [provider]  aspirin EC 81 MG tablet Take 81 mg by mouth daily.    [provider]  Azelastine HCl 0.15 % SOLN Place 1 spray as needed into both nostrils. 03/23/16   [provider]  B Complex Vitamins (VITAMIN-B COMPLEX PO) Take 1 capsule by mouth daily.     [provider]  Calcium-Vitamin D-Vitamin K (VIACTIV) 295-188-41 MG-UNT-MCG CHEW Chew 1 capsule by mouth  2 (two) times daily.     [provider]  Cholecalciferol (VITAMIN D) 2000 UNITS CAPS Take 2,000 Units by mouth daily.     [provider]  Coenzyme Q10 (COQ10 PO) Take by mouth.    [provider]  EPIPEN 2-PAK 0.3 MG/0.3ML SOAJ injection Inject 0.3 mg into the muscle as directed. Reported on 04/12/2015 08/17/13   [provider]  escitalopram (LEXAPRO) 10 MG tablet Take 5 mg by mouth daily. Takes 1 tablet twice weekly  [provider]  Flaxseed, Linseed, (FLAXSEED OIL PO) Take 1,400 mg by mouth daily.    [provider]  NON FORMULARY Inject 1 each into the muscle once a week. Allergy injections once weekly    [provider]  pravastatin (PRAVACHOL) 20 MG tablet Take 20 mg by mouth 4 (four) times a week.  04/01/13   [provider]  TURMERIC PO Take by mouth.    [provider]  valsartan (DIOVAN) 80 MG tablet Take 80 mg by mouth at bedtime. 07/20/19   [provider]  VENTOLIN HFA 108 (90 BASE) MCG/ACT inhaler Inhale 1-2 puffs into the lungs daily as needed for wheezing or shortness of breath.  08/17/13   [provider]  Zoledronic Acid (RECLAST IV) Inject into the vein See admin instructions. Uses every other year    [provider]    Allergies    Other, Codeine, Dilaudid [hydromorphone hcl], and Morphine and related  Review of Systems   Review of Systems  Constitutional: Negative for chills and fever.  HENT: Negative for congestion and rhinorrhea.   Eyes: Negative for redness and visual disturbance.  Respiratory: Positive for shortness of breath. Negative for wheezing.   Cardiovascular: Negative for chest pain and palpitations.  Gastrointestinal: Negative for nausea and vomiting.  Genitourinary: Negative for dysuria and urgency.  Musculoskeletal: Negative for arthralgias and myalgias.  Skin: Negative for pallor and wound.  Neurological: Positive for dizziness. Negative for  headaches.    Physical Exam Updated Vital Signs BP 111/66   Pulse (!) 47   Temp (!) 97.5 F (36.4 C) (Oral)   Resp 18   Ht 5\' 3"  (1.6 m)   Wt 63.4 kg   LMP 02/27/2000   SpO2 100%   BMI 24.76 kg/m   Physical Exam Vitals and nursing note reviewed.  Constitutional:      General: She is not in acute distress.    Appearance: She is well-developed. She is not diaphoretic.  HENT:     Head: Normocephalic and atraumatic.  Eyes:     Pupils: Pupils are equal, round, and reactive to light.  Cardiovascular:     Rate and Rhythm: Tachycardia present. Rhythm irregular.     Heart sounds: No murmur heard.  No friction rub. No gallop.   Pulmonary:     Effort: Pulmonary effort is normal.     Breath sounds: No wheezing or rales.  Abdominal:     General: There is no distension.     Palpations: Abdomen is soft.     Tenderness: There is no abdominal tenderness.  Musculoskeletal:        General: No tenderness.     Cervical back: Normal range of motion and neck supple.  Skin:    General: Skin is warm and dry.  Neurological:     Mental Status: She is alert and oriented to person, place, and time.  Psychiatric:        Behavior: Behavior normal.     ED Results / Procedures / Treatments   Labs (all labs ordered are listed, but only abnormal results are displayed) Labs Reviewed  CBC WITH DIFFERENTIAL/PLATELET - Abnormal; Notable for the following components:      Result Value   Basophils Absolute 0.2 (*)    All other components within normal limits  BASIC METABOLIC PANEL - Abnormal; Notable for the following components:   Glucose, Bld 114 (*)    GFR calc non Af Amer 59 (*)    All  other components within normal limits  TROPONIN I (HIGH SENSITIVITY) - Abnormal; Notable for the following components:   Troponin I (High Sensitivity) 19 (*)    All other components within normal limits  MAGNESIUM  TROPONIN I (HIGH SENSITIVITY)    EKG EKG Interpretation  Date/Time:  Thursday November 05 2019 11:46:20 EDT Ventricular Rate:  139 PR Interval:    QRS Duration: 86 QT Interval:  325 QTC Calculation: 495 R Axis:   33 Text Interpretation: Atrial fibrillation Low voltage, precordial leads Borderline prolonged QT interval No significant change since last tracing Confirmed by Deno Etienne [phone]) on 11/05/2019 12:19:29 PM   Radiology DG Chest Port 1 View  Result Date: 11/05/2019 CLINICAL DATA:  Fatigue EXAM: PORTABLE CHEST 1 VIEW COMPARISON:  06/17/2015 FINDINGS: Overlying cardiac leads. Stable positioning of left-sided implanted cardiac device. Heart size is within normal limits. No focal airspace consolidation, pleural effusion, or pneumothorax. IMPRESSION: No active disease. Electronically Signed   By: Davina Poke D.O.   On: 11/05/2019 12:22    Procedures Procedures (including critical care time)  Medications Ordered in ED Medications  0.9 %  sodium chloride infusion ( Intravenous Stopped 11/05/19 1330)  sodium chloride 0.9 % bolus 1,000 mL (0 mLs Intravenous Stopped 11/05/19 1329)  sodium chloride 0.9 % bolus 1,000 mL (0 mLs Intravenous Stopped 11/05/19 1439)  magnesium sulfate IVPB 2 g 50 mL (0 g Intravenous Stopped 11/05/19 1330)  metoprolol tartrate (LOPRESSOR) injection 5 mg (5 mg Intravenous Given 11/05/19 1201)    ED Course  I have reviewed the triage vital signs and the nursing notes.  Pertinent labs & imaging results that were available during my care of the patient were reviewed by me and considered in my medical decision making (see chart for details).    MDM Rules/Calculators/A&P                          79 yo F with a chief complaints of feeling lightheaded and short of breath.  Started while she was out for a walk this morning.  Found to be in atrial fibrillation with RVR.  Has a history of paroxysmal atrial fibrillation but is not typically in that rhythm.  She is not anticoagulated.  I was discussing with her the protocol to perform electrical cardioversion and  that time the patient had multiple runs of nonsustained ventricular tachycardia.  We will give a bolus of IV fluids a dose of metoprolol obtain laboratory evaluation interrogate pacemaker and discussed with cardiology. Will admit.   CRITICAL CARE Performed by: Cecilio Asper   Total critical care time: 80 minutes  Critical care time was exclusive of separately billable procedures and treating other patients.  Critical care was necessary to treat or prevent imminent or life-threatening deterioration.  Critical care was time spent personally by me on the following activities: development of treatment plan with patient and/or surrogate as well as nursing, discussions with consultants, evaluation of patient's response to treatment, examination of patient, obtaining history from patient or surrogate, ordering and performing treatments and interventions, ordering and review of laboratory studies, ordering and review of radiographic studies, pulse oximetry and re-evaluation of patient's condition. The patients results and plan were reviewed and discussed.   Any x-rays performed were independently reviewed by myself.   Differential diagnosis were considered with the presenting HPI.  Medications  0.9 %  sodium chloride infusion ( Intravenous Stopped 11/05/19 1330)  sodium chloride 0.9 % bolus 1,000 mL (  0 mLs Intravenous Stopped 11/05/19 1329)  sodium chloride 0.9 % bolus 1,000 mL (0 mLs Intravenous Stopped 11/05/19 1439)  magnesium sulfate IVPB 2 g 50 mL (0 g Intravenous Stopped 11/05/19 1330)  metoprolol tartrate (LOPRESSOR) injection 5 mg (5 mg Intravenous Given 11/05/19 1201)    Vitals:   11/05/19 1230 11/05/19 1300 11/05/19 1332 11/05/19 1430  BP: 100/70 101/65 102/77 111/66  Pulse: 73 99 81 (!) 47  Resp: 13 12 19 18   Temp:      TempSrc:      SpO2: 100% 100% 99% 100%  Weight:      Height:        Final diagnoses:  Non-sustained ventricular tachycardia (HCC)    Admission/ observation  were discussed with the admitting physician, patient and/or family and they are comfortable with the plan.    Final Clinical Impression(s) / ED Diagnoses Final diagnoses:  Non-sustained ventricular tachycardia [facility])    Rx / DC Orders ED Discharge Orders    None       Deno Etienne, DO 11/05/19 1512

## 2019-11-05 NOTE — ED Triage Notes (Signed)
She walked 2 miles this morning. C/o dizziness and SOB during the walk. The dizziness has subsided by the SOB continues. Denies pain. Denies recent illness.

## 2019-11-05 NOTE — ED Notes (Signed)
Pt on monitor 

## 2019-11-05 NOTE — ED Notes (Signed)
[employer] rep at bedside to interrogate pacemaker

## 2019-11-05 NOTE — ED Notes (Signed)
ED Provider at bedside. 

## 2019-11-05 NOTE — H&P (Addendum)
Cardiology Admission History and Physical:   Patient ID: MICAELA STITH MRN: [id number]; DOB: May 02, 1940   Admission date: 11/05/2019  Primary Care Provider: Crist Infante, MD [place] Cardiologist: No primary care provider on file.  [facility] Electrophysiologist:  Dr. Lovena Le  Chief Complaint: shortness of breath and dizziness  Patient Profile:   ELVIRA LANGSTON is a 79 y.o. female with a history of complete heart block s/p Biotronik PPM, paroxysmal atrial fibrillation only on Aspirin (has declined anticoagulation in the past), hypertension, and asthma who is being admitted for atrial fibrillation with RVR.  History of Present Illness:   Ms. Depolo is a 79 year old female with the above history who is followed by Dr. Lovena Le. Patient seen by Dr. Lovena Le for syncope - found to have long episodes of asystole with complete heart block. Underwent placement of PPM in 05/2015. She also has been noted to have brief episodes of atrial fibrillation and atypical flutter. Dr. Lovena Le has recommended anticoagulation in the past but patient has declined this offer. Patient was last seen by Dr. Lovena Le in 08/2019 at which time she was doing well. No palpitations.  Patient presented to the [facility] ED yesterday for evaluation of dizziness and shortness of breath. She was found to be in atrial fibrillation with RVR and was also noted to have multiple runs of non-sustained VT. Also hypotensive on arrival with BP as low as 79/60.  High-sensitivity troponin elevated at 11 >> 19. Potassium 4.2. Magnesium 1.8. Hgb 12.4. She was given a bolus of fluids and a dose of Metoprolol and Cardiology was asked to admit.  At the time of this evaluation, patient resting comfortably in no acute distress. Rate controlled. She states she is feeling much better after fluids. Patient reports she was in her usual state of health until this morning. She went for a walking on one of the walking trails in the  area and noticed that she was much more short of breath and tired than usual. She walked 4 times per week and has never felt like this before. When she went back to get in her car, she started to feel dizzy. When she made it back home, she took her BP several times and noticed that her systolic BP was low (59'F to low 100's). She continued to feel dizzy so she decided to go to the ED for further evaluation. No chest pain throughout any of this. No nausea/vomiting or diaphoresis. She denies any shortness of breath prior to this. No orthopnea, PND, or edema. No syncope. No recent fevers or illnesses. She has some nasal drainage from allergies but other respiratory symptoms. No abnormal bleeding in urine or stools.  Of note, Biotronik rep interrogated patient's device in the ED. I am unable to see the report but patient states rep told her that she has been in atrial fibrillation since 1am.   Past Medical History:  Diagnosis Date  . Abnormal Pap smear of cervix   . Acute respiratory failure with hypoxia ([place])   . Asthma    "I'll have an attack if I'm around cat or dust"  . Asystole ([place])   . Atrial fibrillation ([place])   . CHB (complete heart block) ([place]) 05/2015   s/p Biotronik (serial number [id number]) dual lead pacemaker  . Depression   . Exercise-induced asthma   . Hx of colonic polyps   . Hypertension   . Osteopenia   . PONV (postoperative nausea and vomiting)    pt. thinks  it was from dilaudid  . Presence of permanent cardiac pacemaker     Past Surgical History:  Procedure Laterality Date  . ABDOMINAL HYSTERECTOMY N/A 07/28/2013   Procedure: HYSTERECTOMY ABDOMINAL with SALPINGO OOPHORECTOMY;  Surgeon: Lyman Speller, MD;  Location: [facility];  Service: Gynecology;  Laterality: N/A;  request 4.5 hours  . ABDOMINAL SACROCOLPOPEXY N/A 07/28/2013   Procedure: ABDOMINO SACROCOLPOPEXY;  Surgeon: Jamey Reas de Berton Lan, MD;  Location: [facility];  Service: Gynecology;  Laterality: N/A;   . APPENDECTOMY    . BLADDER SUSPENSION N/A 07/28/2013   Procedure: TRANSVAGINAL TAPE (TVT) PROCEDURE midurethral sling;  Surgeon: Everardo All Amundson de Berton Lan, MD;  Location: [facility];  Service: Gynecology;  Laterality: N/A;  . CHOLECYSTECTOMY OPEN    . CRYOTHERAPY     for abnormal pap smear yrs ago  . CYSTOSCOPY N/A 07/28/2013   Procedure: CYSTOSCOPY;  Surgeon: Jamey Reas de Berton Lan, MD;  Location: [facility];  Service: Gynecology;  Laterality: N/A;  . EP IMPLANTABLE DEVICE N/A 06/16/2015   Procedure: Pacemaker Implant;  Surgeon: Evans Lance, MD; Biotronik (serial number [id number]) pacemaker; Laterality: Left  . EP IMPLANTABLE DEVICE N/A 06/16/2015   Procedure: Loop Recorder Removal;  Surgeon: Evans Lance, MD;  Location: [facility];  Service: Cardiovascular;  Laterality: N/A;  . FRACTURE SURGERY    . INSERT / REPLACE / REMOVE PACEMAKER  06/16/2015  . LOOP RECORDER IMPLANT N/A 10/21/2013   Procedure: LOOP RECORDER IMPLANT;  Surgeon: Evans Lance, MD;  Location: [facility];  Service: Cardiovascular;  Laterality: N/A;  . LOOP RECORDER REMOVAL  06/16/2015  . TEMPORARY PACEMAKER INSERTION N/A 07/29/2013   Procedure: TEMPORARY PACEMAKER INSERTION;  Surgeon: Wellington Hampshire, MD;  Location: [facility];  Service: Cardiovascular;  Laterality: N/A;  . TONSILLECTOMY  1940s  . TOTAL ABDOMINAL HYSTERECTOMY W/ BILATERAL SALPINGOOPHORECTOMY  2015   Abdominal sacrocolpopexy, tension-free vaginal tape Exact, cystoscopy   . TUBAL LIGATION    . WRIST FRACTURE SURGERY Left    plate inserted     Medications Prior to Admission: Prior to Admission medications   Medication Sig Start Date End Date Taking? Authorizing Provider  valsartan (DIOVAN) 80 MG tablet Take 80 mg by mouth daily.   Yes [provider]  aspirin EC 81 MG tablet Take 81 mg by mouth daily.    [provider]  Azelastine HCl 0.15 % SOLN Place 1 spray as needed into both nostrils. 03/23/16   [provider]  B Complex Vitamins (VITAMIN-B COMPLEX PO) Take 1 capsule by mouth daily.     [provider]  Calcium-Vitamin D-Vitamin K (VIACTIV) 540-981-19 MG-UNT-MCG CHEW Chew 1 capsule by mouth 2 (two) times daily.     [provider]  Cholecalciferol (VITAMIN D) 2000 UNITS CAPS Take 2,000 Units by mouth daily.     [provider]  Coenzyme Q10 (COQ10 PO) Take by mouth.    [provider]  EPIPEN 2-PAK 0.3 MG/0.3ML SOAJ injection Inject 0.3 mg into the muscle as directed. Reported on 04/12/2015 08/17/13   [provider]  escitalopram (LEXAPRO) 10 MG tablet Take 5 mg by mouth daily. Takes 1 tablet twice weekly    [provider]  Flaxseed, Linseed, (FLAXSEED OIL PO) Take 1,400 mg by mouth daily.    [provider]  NON FORMULARY Inject 1 each into the muscle once a week. Allergy injections once weekly    [provider]  pravastatin (PRAVACHOL) 20 MG tablet Take 20 mg by mouth 4 (four) times a week.  04/01/13   [provider]  TURMERIC PO Take by mouth.    [provider]  valsartan (DIOVAN) 80 MG tablet Take 80 mg by mouth at bedtime. 07/20/19   [provider]  VENTOLIN HFA 108 (90 BASE) MCG/ACT inhaler Inhale 1-2 puffs into the lungs daily as needed for wheezing or shortness of breath.  08/17/13   [provider]  Zoledronic Acid (RECLAST IV) Inject into the vein See admin instructions. Uses every other year    [provider]     Allergies:    Allergies  Allergen Reactions  . Other Anaphylaxis    Cat dander  . Codeine Nausea And Vomiting  . Dilaudid [Hydromorphone Hcl] Nausea And Vomiting  . Morphine And Related Nausea And Vomiting    Social History:   Social History   Socioeconomic History  . Marital status: Divorced    Spouse name: Not on file  . Number of children: Not on file  . Years of education: Not on file  . Highest education level: Not on file    Occupational History  . Not on file  Tobacco Use  . Smoking status: Former Smoker    Packs/day: 1.00    Years: 30.00    Pack years: 30.00    Types: Cigarettes    Quit date: 07/02/1988    Years since quitting: 31.3  . Smokeless tobacco: Never Used  Vaping Use  . Vaping Use: Never used  Substance and Sexual Activity  . Alcohol use: Yes    Alcohol/week: 0.0 - 1.0 standard drinks  . Drug use: No  . Sexual activity: Not Currently    Partners: Male    Birth control/protection: Post-menopausal, Surgical    Comment: TAH/BSO  Other Topics Concern  . Not on file  Social History Narrative  . Not on file   Social Determinants of Health   Financial Resource Strain:   . Difficulty of Paying Living Expenses: Not on file  Food Insecurity:   . Worried About Charity fundraiser in the Last Year: Not on file  . Ran Out of Food in the Last Year: Not on file  Transportation Needs:   . Lack of Transportation (Medical): Not on file  . Lack of Transportation (Non-Medical): Not on file  Physical Activity:   . Days of Exercise per Week: Not on file  . Minutes of Exercise per Session: Not on file  Stress:   . Feeling of Stress : Not on file  Social Connections:   . Frequency of Communication with Friends and Family: Not on file  . Frequency of Social Gatherings with Friends and Family: Not on file  . Attends Religious Services: Not on file  . Active Member of Clubs or Organizations: Not on file  . Attends Archivist Meetings: Not on file  . Marital Status: Not on file  Intimate Partner Violence:   . Fear of Current or Ex-Partner: Not on file  . Emotionally Abused: Not on file  . Physically Abused: Not on file  . Sexually Abused: Not on file    Family History:   The patient's family history includes Cancer in her brother; Heart attack in her father; Varicose Veins in her mother.    ROS:  Please see the history of present illness.  All other ROS reviewed and negative.      Physical Exam/Data:  Vitals:   11/05/19 1332 11/05/19 1430 11/05/19 1530 11/05/19 1600  BP: 102/77 111/66 112/63 107/85  Pulse: 81 (!) 47 97 (!) 34  Resp: 19 18 14  (!) 22  Temp:   97.8 F (36.6 C)   TempSrc:      SpO2: 99% 100% 100% 100%  Weight:      Height:        Intake/Output Summary (Last 24 hours) at 11/05/2019 1735 Last data filed at 11/05/2019 1439 Gross per 24 hour  Intake 2060.16 ml  Output --  Net 2060.16 ml   Last 3 Weights 11/05/2019 09/10/2019 09/22/2018  Weight (lbs) 139 lb 12.8 oz 138 lb 6.4 oz 139 lb  Weight (kg) 63.413 kg 62.778 kg 63.05 kg     Body mass index is 24.76 kg/m.  General: 79 y.o. female resting comfortably in no acute distress. HEENT: Normocephalic and atraumatic. Sclera clear. EOMs intact. Neck: Supple. No carotid bruits. No JVD. Heart: RRR. Distinct S1 and S2. No murmurs, gallops, or rubs. Radial and distal pedal pulses 2+ and equal bilaterally. Lungs: No increased work of breathing. Clear to ausculation bilaterally. No wheezes, rhonchi, or rales.  Abdomen: Soft, non-distended, and non-tender to palpation. Bowel sounds present. MSK: Normal strength and tone for age. Extremities: No lower extremity edema.    Skin: Warm and dry. Neuro: Alert and oriented x3. No focal deficits. Psych: Normal affect. Responds appropriately.    EKG:  The ECG that was done  was personally reviewed and demonstrates atrial fibrillation, rate 139, with no acute ischemic changes.   Telemetry: Telemetry personally reviewed and demonstrates A-paced. Hard to distinguish P waves. Rhythm irregular at times and looks at atrial fibrillation. However, also very regular at times (may be going in and out of atrial fibrillation)  Relevant CV Studies: N/A.  Laboratory Data:  High Sensitivity Troponin:   Recent Labs  Lab 11/05/19 1149 11/05/19 1333  TROPONINIHS 11 19*      Chemistry Recent Labs  Lab 11/05/19 1149  NA 135  K 4.2  CL 100  CO2 22  GLUCOSE 114*   BUN 22  CREATININE 0.93  CALCIUM 10.1  GFRNONAA 59*  GFRAA >60  ANIONGAP 13    No results for input(s): PROT, ALBUMIN, AST, ALT, ALKPHOS, BILITOT in the last 168 hours. Hematology Recent Labs  Lab 11/05/19 1149  WBC 8.2  RBC 4.25  HGB 12.4  HCT 38.2  MCV 89.9  MCH 29.2  MCHC 32.5  RDW 13.9  PLT 326   BNPNo results for input(s): BNP, PROBNP in the last 168 hours.  DDimer No results for input(s): DDIMER in the last 168 hours.   Radiology/Studies:  DG Chest Port 1 View  Result Date: 11/05/2019 CLINICAL DATA:  Fatigue EXAM: PORTABLE CHEST 1 VIEW COMPARISON:  06/17/2015 FINDINGS: Overlying cardiac leads. Stable positioning of left-sided implanted cardiac device. Heart size is within normal limits. No focal airspace consolidation, pleural effusion, or pneumothorax. IMPRESSION: No active disease. Electronically Signed   By: Davina Poke D.O.   On: 11/05/2019 12:22    Assessment and Plan:   Atrial Fibrillation with RVR History of Atrial Fibrillation/Flutter - Patient report shortness of breath and dizziness today while walking. No palpitations. Found to be in atrial fibrillation with rates in the 130's on arrival to the ED. She has a history of brief episodes of atrial fibrillation/flutter. - Biotronik device checked in the ED - I cannot see this report but patient states the rep told her that she had been  in atrial fibrillation since 1am this morning. - Currently rate controlled. A-paced on telemetry. Difficult to tell underlying rhythm. - Potassium 4.2.  - Magnesium 1.8. Will replete. - Will check TSH.  - Will check Echo. - Will start Toprol-XL 25mg  daily.  - CHA2DS2-VASc = 4 (HTN, age x2, female). Patient previously has declined anticoagulation due to fear of risk of bleeding. I discussed risk and benefits. Patient is willing to start anticoagulation now. Will start Eliquis 5mg  twice daily.  Complete Heart Block s/p PPM - S/p Biotronik PPM in 05/2015.  - Followed by Dr.  Lovena Le.  Hypertension - BP initially low but has improved. - Will hold home ARB so Toprol-XL can be added.  Severity of Illness: The appropriate patient status for this patient is OBSERVATION. Observation status is judged to be reasonable and necessary in order to provide the required intensity of service to ensure the patient's safety. The patient's presenting symptoms, physical exam findings, and initial radiographic and laboratory data in the context of their medical condition is felt to place them at decreased risk for further clinical deterioration. Furthermore, it is anticipated that the patient will be medically stable for discharge from the hospital within 2 midnights of admission. The following factors support the patient status of observation.   " The patient's presenting symptoms include shortness of breath and dizziness. " The physical exam findings include atrial fibrillation and hypotension. " The initial radiographic and laboratory data as above.     For questions or updates, please contact [place] Please consult [email or web address] for contact info under     Signed, Darreld Mclean, PA-C  11/05/2019 5:35 PM   Patient seen and examined with Sande Rives PA-C.  Agree as above, with the following exceptions and changes as noted below. Feeling better, currently in A paced rhythm.. Gen: NAD, CV: RRR, no murmurs, Lungs: clear, Abd: soft, Extrem: Warm, well perfused, no edema, Neuro/Psych: alert and oriented x 3, normal mood and affect. All available labs, radiology testing, previous records reviewed. We will start beta blockade, hold ARB for titration of BB initially, and initiate eliquis for [facility]. Discussed in detail with patient who is in agreement with plan. Anticipate DC tomorrow if no sustained VT  Elouise Munroe, MD 11/05/19 10:47 PM

## 2019-11-06 ENCOUNTER — Observation Stay (HOSPITAL_BASED_OUTPATIENT_CLINIC_OR_DEPARTMENT_OTHER): Payer: Medicare HMO

## 2019-11-06 DIAGNOSIS — I4891 Unspecified atrial fibrillation: Secondary | ICD-10-CM

## 2019-11-06 DIAGNOSIS — I472 Ventricular tachycardia: Secondary | ICD-10-CM | POA: Diagnosis not present

## 2019-11-06 LAB — BASIC METABOLIC PANEL
Anion gap: 7 (ref 5–15)
BUN: 18 mg/dL (ref 8–23)
CO2: 25 mmol/L (ref 22–32)
Calcium: 8.7 mg/dL — ABNORMAL LOW (ref 8.9–10.3)
Chloride: 110 mmol/L (ref 98–111)
Creatinine, Ser: 0.87 mg/dL (ref 0.44–1.00)
GFR calc Af Amer: >60 mL/min (ref >60)
GFR calc non Af Amer: >60 mL/min (ref >60)
Glucose, Bld: 105 mg/dL — ABNORMAL HIGH (ref 70–99)
Potassium: 4.2 mmol/L (ref 3.5–5.1)
Sodium: 142 mmol/L (ref 135–145)

## 2019-11-06 LAB — ECHOCARDIOGRAM COMPLETE
Height: 63 in
S' Lateral: 2.3 cm
Weight: 2236.8 oz

## 2019-11-06 LAB — MAGNESIUM: Magnesium: 2.3 mg/dL (ref 1.7–2.4)

## 2019-11-06 MED ORDER — PRAVASTATIN SODIUM 20 MG PO TABS: 20.0000 mg | ORAL_TABLET | Freq: Every evening | ORAL | Status: AC

## 2019-11-06 MED ORDER — APIXABAN 5 MG PO TABS
5.0000 mg | ORAL_TABLET | Freq: Two times a day (BID) | ORAL | 3 refills | Status: DC
Start: 2019-11-06 — End: 2020-01-20

## 2019-11-06 MED ORDER — METOPROLOL SUCCINATE ER 25 MG PO TB24
25.0000 mg | ORAL_TABLET | Freq: Every day | ORAL | 3 refills | Status: DC
Start: 2019-11-07 — End: 2020-11-24

## 2019-11-06 MED FILL — ELIQUIS 5 MG TABLET: 5 | 30 days supply | Qty: 60 | Fill #0

## 2019-11-06 MED FILL — METOPROLOL SUCCINATE ER 25: 25 | 30 days supply | Qty: 30 | Fill #0

## 2019-11-06 NOTE — Progress Notes (Signed)
Progress Note  Patient Name: Danielle Lucas Date of Encounter: 11/06/2019  Primary Cardiologist: No primary care provider on file.   Subjective   Feels well, telemetry tech told nursing that she was in sinus rhythm all night with no NSVT.  Telemetry remotely was not working for my review.  Inpatient Medications    Scheduled Meds: . apixaban  5 mg Oral BID  . escitalopram  5 mg Oral Daily  . metoprolol succinate  25 mg Oral Daily  . pravastatin  20 mg Oral Once per day on Sun Tue Thu Sat   Continuous Infusions: . sodium chloride Stopped (11/05/19 1330)   PRN Meds: sodium chloride, acetaminophen, albuterol, nitroGLYCERIN, ondansetron (ZOFRAN) IV   Vital Signs    Vitals:   11/05/19 1939 11/05/19 2304 11/06/19 0331 11/06/19 0901  BP: 129/67 (!) 120/58 118/60 127/64  Pulse: 68 72 65 64  Resp: 18 17 17 14   Temp: 97.8 F (36.6 C) 98 F (36.7 C) 97.8 F (36.6 C) 97.9 F (36.6 C)  TempSrc: Oral Oral Oral Oral  SpO2: 100% 98% 98% 98%  Weight:      Height:        Intake/Output Summary (Last 24 hours) at 11/06/2019 0944 Last data filed at 11/05/2019 1439 Gross per 24 hour  Intake 2060.16 ml  Output --  Net 2060.16 ml   Filed Weights   11/05/19 1137  Weight: 63.4 kg    Telemetry    Unremarkable, sinus rhythm- Personally Reviewed  ECG    No new- Personally Reviewed  Physical Exam   GEN: No acute distress.   Neck: No JVD Cardiac: regular rhythm, normal rate, no murmurs, rubs, or gallops.  Respiratory: Clear to auscultation bilaterally. GI: Soft, nontender, non-distended  MS: No edema; No deformity. Neuro:  Nonfocal  Psych: Normal affect   Labs    Chemistry Recent Labs  Lab 11/05/19 1149 11/06/19 0251  NA 135 142  K 4.2 4.2  CL 100 110  CO2 22 25  GLUCOSE 114* 105*  BUN 22 18  CREATININE 0.93 0.87  CALCIUM 10.1 8.7*  GFRNONAA 59* >60  GFRAA >60 >60  ANIONGAP 13 7     Hematology Recent Labs  Lab 11/05/19 1149  WBC 8.2  RBC 4.25   HGB 12.4  HCT 38.2  MCV 89.9  MCH 29.2  MCHC 32.5  RDW 13.9  PLT 326    Cardiac EnzymesNo results for input(s): TROPONINI in the last 168 hours. No results for input(s): TROPIPOC in the last 168 hours.   BNPNo results for input(s): BNP, PROBNP in the last 168 hours.   DDimer No results for input(s): DDIMER in the last 168 hours.   Radiology    DG Chest Port 1 View  Result Date: 11/05/2019 CLINICAL DATA:  Fatigue EXAM: PORTABLE CHEST 1 VIEW COMPARISON:  06/17/2015 FINDINGS: Overlying cardiac leads. Stable positioning of left-sided implanted cardiac device. Heart size is within normal limits. No focal airspace consolidation, pleural effusion, or pneumothorax. IMPRESSION: No active disease. Electronically Signed   By: Davina Poke D.O.   On: 11/05/2019 12:22    Cardiac Studies   Echocardiogram final report pending, however preliminary review suggests normal biventricular function without significant valvular heart disease  Patient Profile     Danielle Lucas is a 79 y.o. female with a history of complete heart block s/p Biotronik PPM, paroxysmal atrial fibrillation only on Aspirin (has declined anticoagulation in the past), hypertension, and asthma who is being admitted for atrial  fibrillation with RVR.   Assessment & Plan   Active Problems:   Non-sustained ventricular tachycardia (HCC)   Atrial fibrillation with RVR (HCC)   She had no repeat episodes of atrial fibrillation overnight, felt well, and is ready for hospital discharge.  We discussed anticoagulation again, she will continue Eliquis 5 mg twice daily.  She will hold her valsartan until follow-up since we initiated metoprolol.  She has not taken beta-blockade before per her report.  If she continues to have elevated blood pressures, would resume valsartan.  We discussed that valsartan is more strongly indicated for control of high blood pressure, metoprolol may not have as much of an impact on blood pressure  control, and she may very well need 2 agents.  We will arrange follow-up in Dr. Tanna Furry clinic in 2 to 4 weeks for review of hospitalization.  For questions or updates, please contact [place] Please consult [email or web address] for contact info under        Signed, Elouise Munroe, MD  11/06/2019, 9:44 AM

## 2019-11-06 NOTE — Discharge Instructions (Signed)

## 2019-11-06 NOTE — Discharge Summary (Addendum)
Discharge Summary    Patient ID: Danielle Lucas MRN: [id number]; DOB: 1940-05-10  Admit date: 11/05/2019 Discharge date: 11/06/2019  Primary Care Provider: Crist Infante, MD  Primary Cardiologist: Cristopher Peru, MD  Primary Electrophysiologist:  None   Discharge Diagnoses    Principal Problem:   Atrial fibrillation with RVR [facility]) Active Problems:   Vagally Mediated Asystole   Sinus node dysfunction ([place])   Pacemaker   Non-sustained ventricular tachycardia ([place])    Diagnostic Studies/Procedures    Echo 11/06/19: Final read pending _____________   History of Present Illness     Danielle Lucas is a 79 y.o. female with a history of complete heart block s/p Biotronik PPM, paroxysmal atrial fibrillation only on Aspirin (has declined anticoagulation in the past), hypertension, and asthma who is being admitted for atrial fibrillation with RVR.  Danielle Lucas is a 79 year old female with the above history who is followed by Dr. Lovena Le. Patient seen by Dr. Lovena Le for syncope - found to have long episodes of asystole with complete heart block. Underwent placement of PPM in 05/2015. She also has been noted to have brief episodes of atrial fibrillation and atypical flutter. Dr. Lovena Le has recommended anticoagulation in the past but patient has declined this offer. Patient was last seen by Dr. Lovena Le in 08/2019 at which time she was doing well. No palpitations.  Patient presented to the [facility] ED yesterday for evaluation of dizziness and shortness of breath. She was found to be in atrial fibrillation with RVR and was also noted to have multiple runs of non-sustained VT. Also hypotensive on arrival with BP as low as 79/60.  High-sensitivity troponin elevated at 11 >> 19. Potassium 4.2. Magnesium 1.8. Hgb 12.4. She was given a bolus of fluids and a dose of Metoprolol and Cardiology was asked to admit.  At the time of this evaluation, patient resting comfortably in no acute  distress. Rate controlled. She states she is feeling much better after fluids. Patient reports she was in her usual state of health until this morning. She went for a walking on one of the walking trails in the area and noticed that she was much more short of breath and tired than usual. She walked 4 times per week and has never felt like this before. When she went back to get in her car, she started to feel dizzy. When she made it back home, she took her BP several times and noticed that her systolic BP was low (25'K to low 100's). She continued to feel dizzy so she decided to go to the ED for further evaluation. No chest pain throughout any of this. No nausea/vomiting or diaphoresis. She denies any shortness of breath prior to this. No orthopnea, PND, or edema. No syncope. No recent fevers or illnesses. She has some nasal drainage from allergies but other respiratory symptoms. No abnormal bleeding in urine or stools.  Of note, Bi[employer]ron[employer] rep interrogated patient's device in the ED. I am unable to see the report but patient states rep told her that she has been in atrial fibrillation since [place] [facility]     Consultants: none  Atrial fibrillation with RVR NSVT Hx of Afib/flutter followed by EP Biotronik device interrogated, rep reportedly told patient she had been in Afib since 1AM that morning. Echocardiogram performed and reviewed by Dr. Margaretann Loveless - final read pending, no significant change.  Toprol XL 25 mg daily started.  Patient previously declined anticoagulation. Given her high chadsvasc  score, she has agree with start eliquis 5 mg BID.  This patients CHA2DS2-VASc Score and unadjusted Ischemic Stroke Rate (% per year) is equal to 4.8 % stroke rate/year from a score of 4 (HTN, 2age, female)   Complete heart block PPM in place (05/2015).    Hypertension Will hold valsartan until follow up. Continue toprol as above.   I stopped ASA in the setting of eliquis.  Pt seen and  examined by Dr. Margaretann Loveless and deemed stable for discharge.  Follow up has been arranged.   Did the patient have an acute coronary syndrome (MI, NSTEMI, STEMI, etc) this admission?:  No                               Did the patient have a percutaneous coronary intervention (stent / angioplasty)?:  No.   _____________  Discharge Vitals Blood pressure 127/64, pulse 64, temperature 97.9 F (36.6 C), temperature source Oral, resp. rate 14, height 5\' 3"  (1.6 m), weight 63.4 kg, last menstrual period 02/27/2000, SpO2 98 %.  Filed Weights   11/05/19 1137  Weight: 63.4 kg    Labs & Radiologic Studies    CBC Recent Labs    11/05/19 1149  WBC 8.2  NEUTROABS 5.4  HGB 12.4  HCT 38.2  MCV 89.9  PLT 161   Basic Metabolic Panel Recent Labs    11/05/19 1149 11/06/19 0251  NA 135 142  K 4.2 4.2  CL 100 110  CO2 22 25  GLUCOSE 114* 105*  BUN 22 18  CREATININE 0.93 0.87  CALCIUM 10.1 8.7*  MG 1.8 2.3   Liver Function Tests No results for input(s): AST, ALT, ALKPHOS, BILITOT, PROT, ALBUMIN in the last 72 hours. No results for input(s): LIPASE, AMYLASE in the last 72 hours. High Sensitivity Troponin:   Recent Labs  Lab 11/05/19 1149 11/05/19 1333  TROPONINIHS 11 19*    BNP Invalid input(s): POCBNP D-Dimer No results for input(s): DDIMER in the last 72 hours. Hemoglobin A1C No results for input(s): HGBA1C in the last 72 hours. Fasting Lipid Panel No results for input(s): CHOL, HDL, LDLCALC, TRIG, CHOLHDL, LDLDIRECT in the last 72 hours. Thyroid Function Tests Recent Labs    11/05/19 2104  TSH 2.650   _____________  DG Chest Port 1 View  Result Date: 11/05/2019 CLINICAL DATA:  Fatigue EXAM: PORTABLE CHEST 1 VIEW COMPARISON:  06/17/2015 FINDINGS: Overlying cardiac leads. Stable positioning of left-sided implanted cardiac device. Heart size is within normal limits. No focal airspace consolidation, pleural effusion, or pneumothorax. IMPRESSION: No active disease.  Electronically Signed   By: Davina Poke D.O.   On: 11/05/2019 12:22   Disposition   Pt is being discharged home today in good condition.  Follow-up Plans & Appointments     Follow-up Information    Evans Lance, MD Follow up on 11/24/2019.   Specialty: Cardiology Why: 2:15 pm Contact information: [address] [place] [address] [phone]                Discharge Medications   Allergies as of 11/06/2019      Reactions   Other Anaphylaxis   Cat dander   Codeine Nausea And Vomiting   Dilaudid [hydromorphone Hcl] Nausea And Vomiting   Morphine And Related Nausea And Vomiting      Medication List    STOP taking these medications   aspirin EC 81 MG tablet  valsartan 80 MG tablet Commonly known as: DIOVAN     TAKE these medications   apixaban 5 MG Tabs tablet Commonly known as: ELIQUIS Take 1 tablet (5 mg total) by mouth 2 (two) times daily.   COQ10 PO Take by mouth.   EpiPen 2-Pak 0.3 mg/0.3 mL Soaj injection Generic drug: EPINEPHrine Inject 0.3 mg into the muscle as directed. Reported on 04/12/2015   escitalopram 10 MG tablet Commonly known as: LEXAPRO Take 5 mg by mouth daily. Takes 1 tablet twice weekly   FLAXSEED OIL PO Take 1,400 mg by mouth daily.   metoprolol succinate 25 MG 24 hr tablet Commonly known as: TOPROL-XL Take 1 tablet (25 mg total) by mouth daily. Start taking on: November 07, 2019   pravastatin 20 MG tablet Commonly known as: PRAVACHOL Take 1 tablet (20 mg total) by mouth every evening. Take four times weekly. What changed: additional instructions   RECLAST IV Inject into the vein See admin instructions. Uses every other year. Unknown Dose   TURMERIC PO Take 15 mLs by mouth daily.   Ventolin HFA 108 (90 Base) MCG/ACT inhaler Generic drug: albuterol Inhale 1-2 puffs into the lungs daily as needed for wheezing or shortness of breath.   Viactiv 053-976-73 MG-UNT-MCG Chew Generic drug:  Calcium-Vitamin D-Vitamin K Chew 1 capsule by mouth 2 (two) times daily.   Vitamin D 50 MCG (2000 UT) Caps Take 2,000 Units by mouth daily.   VITAMIN-B COMPLEX PO Take 1 capsule by mouth daily.          Outstanding Labs/Studies   Follow BP, restart valsartan as needed.  Duration of Discharge Encounter   Greater than 30 minutes including physician time.  Signed, Tami Lin Rubena Roseman, PA 11/06/2019, 10:23 AM

## 2019-11-06 NOTE — Plan of Care (Signed)
Continue to monitor

## 2019-11-06 NOTE — Progress Notes (Signed)
  Echocardiogram 2D Echocardiogram has been performed.  Danielle Lucas 11/06/2019, 8:46 AM

## 2019-11-11 DIAGNOSIS — J3089 Other allergic rhinitis: Secondary | ICD-10-CM | POA: Diagnosis not present

## 2019-11-11 DIAGNOSIS — J301 Allergic rhinitis due to pollen: Secondary | ICD-10-CM | POA: Diagnosis not present

## 2019-11-24 ENCOUNTER — Encounter: Payer: Self-pay | Admitting: Internal Medicine

## 2019-11-24 ENCOUNTER — Ambulatory Visit: Payer: Medicare HMO | Admitting: Internal Medicine

## 2019-11-24 ENCOUNTER — Other Ambulatory Visit: Payer: Self-pay

## 2019-11-24 VITALS — BP 118/80 | HR 71 | Ht 63.0 in | Wt 136.0 lb

## 2019-11-24 DIAGNOSIS — I495 Sick sinus syndrome: Secondary | ICD-10-CM | POA: Diagnosis not present

## 2019-11-24 DIAGNOSIS — Z950 Presence of cardiac pacemaker: Secondary | ICD-10-CM | POA: Diagnosis not present

## 2019-11-24 DIAGNOSIS — I480 Paroxysmal atrial fibrillation: Secondary | ICD-10-CM

## 2019-11-24 NOTE — Progress Notes (Signed)
HPI Mrs. Sample returns today for followup of her atrial arrhythmias. She is a pleasant 79 yo woman with a h/o CHB, s/p PPM insertion, PAF, having refused systemic anti-coagulation, HTN, and NSVT. She was in the ED with PAF a couple of weeks ago. In the interim, she notes that she has not had more arrhythmia and denies chest pain or sob. She has agreed to start anti-coagulation.  Allergies  Allergen Reactions  . Other Anaphylaxis    Cat dander  . Codeine Nausea And Vomiting  . Dilaudid [Hydromorphone Hcl] Nausea And Vomiting  . Morphine And Related Nausea And Vomiting     Current Outpatient Medications  Medication Sig Dispense Refill  . apixaban (ELIQUIS) 5 MG TABS tablet Take 1 tablet (5 mg total) by mouth 2 (two) times daily. 180 tablet 3  . B Complex Vitamins (VITAMIN-B COMPLEX PO) Take 1 capsule by mouth daily.     . Calcium-Vitamin D-Vitamin K (VIACTIV) 299-242-68 MG-UNT-MCG CHEW Chew 1 capsule by mouth 2 (two) times daily.     . Cholecalciferol (VITAMIN D) 2000 UNITS CAPS Take 2,000 Units by mouth daily.     . Coenzyme Q10 (COQ10 PO) Take by mouth.    . EPIPEN 2-PAK 0.3 MG/0.3ML SOAJ injection Inject 0.3 mg into the muscle as directed. Reported on 04/12/2015    . escitalopram (LEXAPRO) 10 MG tablet Take 5 mg by mouth daily. Takes 1 tablet twice weekly    . Flaxseed, Linseed, (FLAXSEED OIL PO) Take 1,400 mg by mouth daily.    . metoprolol succinate (TOPROL-XL) 25 MG 24 hr tablet Take 1 tablet (25 mg total) by mouth daily. 180 tablet 3  . pravastatin (PRAVACHOL) 20 MG tablet Take 1 tablet (20 mg total) by mouth every evening. Take four times weekly.    . TURMERIC PO Take 15 mLs by mouth daily.     . VENTOLIN HFA 108 (90 BASE) MCG/ACT inhaler Inhale 1-2 puffs into the lungs daily as needed for wheezing or shortness of breath.     . Zoledronic Acid (RECLAST IV) Inject into the vein See admin instructions. Uses every other year. Unknown Dose     No current  facility-administered medications for this visit.     Past Medical History:  Diagnosis Date  . Abnormal Pap smear of cervix   . Acute respiratory failure with hypoxia ([place])   . Asthma    "I'll have an attack if I'm around cat or dust"  . Asystole ([place])   . Atrial fibrillation ([place])   . CHB (complete heart block) ([place]) 05/2015   s/p Biotronik (serial number [id number]) dual lead pacemaker  . Depression   . Exercise-induced asthma   . Hx of colonic polyps   . Hypertension   . Osteopenia   . PONV (postoperative nausea and vomiting)    pt. thinks it was from dilaudid  . Presence of permanent cardiac pacemaker     ROS:   All systems reviewed and negative except as noted in the HPI.   Past Surgical History:  Procedure Laterality Date  . ABDOMINAL HYSTERECTOMY N/A 07/28/2013   Procedure: HYSTERECTOMY ABDOMINAL with SALPINGO OOPHORECTOMY;  Surgeon: Lyman Speller, MD;  Location: [facility];  Service: Gynecology;  Laterality: N/A;  request 4.5 hours  . ABDOMINAL SACROCOLPOPEXY N/A 07/28/2013   Procedure: ABDOMINO SACROCOLPOPEXY;  Surgeon: Jamey Reas de Berton Lan, MD;  Location: [facility];  Service: Gynecology;  Laterality: N/A;  . APPENDECTOMY    .  BLADDER SUSPENSION N/A 07/28/2013   Procedure: TRANSVAGINAL TAPE (TVT) PROCEDURE midurethral sling;  Surgeon: Everardo All Amundson de Berton Lan, MD;  Location: [facility];  Service: Gynecology;  Laterality: N/A;  . CHOLECYSTECTOMY OPEN    . CRYOTHERAPY     for abnormal pap smear yrs ago  . CYSTOSCOPY N/A 07/28/2013   Procedure: CYSTOSCOPY;  Surgeon: Jamey Reas de Berton Lan, MD;  Location: [facility];  Service: Gynecology;  Laterality: N/A;  . EP IMPLANTABLE DEVICE N/A 06/16/2015   Procedure: Pacemaker Implant;  Surgeon: Evans Lance, MD; Biotronik (serial number [id number]) pacemaker; Laterality: Left  . EP IMPLANTABLE DEVICE N/A 06/16/2015   Procedure: Loop Recorder Removal;  Surgeon: Evans Lance, MD;  Location: [facility];  Service: Cardiovascular;  Laterality: N/A;  . FRACTURE SURGERY    . INSERT / REPLACE / REMOVE PACEMAKER  06/16/2015  . LOOP RECORDER IMPLANT N/A 10/21/2013   Procedure: LOOP RECORDER IMPLANT;  Surgeon: Evans Lance, MD;  Location: [facility];  Service: Cardiovascular;  Laterality: N/A;  . LOOP RECORDER REMOVAL  06/16/2015  . TEMPORARY PACEMAKER INSERTION N/A 07/29/2013   Procedure: TEMPORARY PACEMAKER INSERTION;  Surgeon: Wellington Hampshire, MD;  Location: [facility];  Service: Cardiovascular;  Laterality: N/A;  . TONSILLECTOMY  1940s  . TOTAL ABDOMINAL HYSTERECTOMY W/ BILATERAL SALPINGOOPHORECTOMY  2015   Abdominal sacrocolpopexy, tension-free vaginal tape Exact, cystoscopy   . TUBAL LIGATION    . WRIST FRACTURE SURGERY Left    plate inserted     Family History  Problem Relation Age of Onset  . Varicose Veins Mother   . Heart attack Father   . Cancer Brother        colon cancer (polyps)     Social History   Socioeconomic History  . Marital status: Divorced    Spouse name: Not on file  . Number of children: Not on file  . Years of education: Not on file  . Highest education level: Not on file  Occupational History  . Not on file  Tobacco Use  . Smoking status: Former Smoker    Packs/day: 1.00    Years: 30.00    Pack years: 30.00    Types: Cigarettes    Quit date: 07/02/1988    Years since quitting: 31.4  . Smokeless tobacco: Never Used  Vaping Use  . Vaping Use: Never used  Substance and Sexual Activity  . Alcohol use: Yes    Alcohol/week: 0.0 - 1.0 standard drinks  . Drug use: No  . Sexual activity: Not Currently    Partners: Male    Birth control/protection: Post-menopausal, Surgical    Comment: TAH/BSO  Other Topics Concern  . Not on file  Social History Narrative  . Not on file   Social Determinants of Health   Financial Resource Strain:   . Difficulty of Paying Living Expenses: Not on file  Food Insecurity:   . Worried About Sales executive in the Last Year: Not on file  . Ran Out of Food in the Last Year: Not on file  Transportation Needs:   . Lack of Transportation (Medical): Not on file  . Lack of Transportation (Non-Medical): Not on file  Physical Activity:   . Days of Exercise per Week: Not on file  . Minutes of Exercise per Session: Not on file  Stress:   . Feeling of Stress : Not on file  Social Connections:   . Frequency of  Communication with Friends and Family: Not on file  . Frequency of Social Gatherings with Friends and Family: Not on file  . Attends Religious Services: Not on file  . Active Member of Clubs or Organizations: Not on file  . Attends Archivist Meetings: Not on file  . Marital Status: Not on file  Intimate Partner Violence:   . Fear of Current or Ex-Partner: Not on file  . Emotionally Abused: Not on file  . Physically Abused: Not on file  . Sexually Abused: Not on file     LMP 02/27/2000   Physical Exam:  Well appearing NAD HEENT: Unremarkable Neck:  No JVD, no thyromegally Lymphatics:  No adenopathy Back:  No CVA tenderness Lungs:  Clear HEART:  Regular rate rhythm, no murmurs, no rubs, no clicks Abd:  soft, positive bowel sounds, no organomegally, no rebound, no guarding Ext:  2 plus pulses, no edema, no cyanosis, no clubbing Skin:  No rashes no nodules Neuro:  CN II through XII intact, motor grossly intact  EKG nsr with atrial pacing  DEVICE  Normal device function.  See PaceArt for details.   Assess/Plan: 1. Atrial fib - I discussed the warning signs. She will continue her [place]. I instructed her to take up to 100 mg of toprol daily as needed for recurrent atrial fib.  2. Coags - she has not had any bleeding. 3. Sinus node dysfunction - her symptoms are controlled, s/p PPM insertion. 4. HTN - her bp is well controlled. She will continue her beta blocker.  Carleene Overlie Iven Earnhart,MD

## 2019-11-24 NOTE — Patient Instructions (Signed)
Medication Instructions:  Your physician recommends that you continue on your current medications as directed. Please refer to the Current Medication list given to you today.  Labwork: None ordered.  Testing/Procedures: None ordered.  Follow-Up: Your physician wants you to follow-up in: 6 months with Dr. Lovena Le.  You will receive a reminder letter in the mail two months in advance. If you don't receive a letter, please call our office to schedule the follow-up appointment.  Remote monitoring is used to monitor your Pacemaker from home. This monitoring reduces the number of office visits required to check your device to one time per year. It allows [place] to keep an eye on the functioning of your device to ensure it is working properly. You are scheduled for a device check from home on 12/17/2019. You may send your transmission at any time that day. If you have a wireless device, the transmission will be sent automatically. After your physician reviews your transmission, you will receive a postcard with your next transmission date.  Any Other Special Instructions Will Be Listed Below (If Applicable).  If you need a refill on your cardiac medications before your next appointment, please call your pharmacy.

## 2019-12-03 DIAGNOSIS — J3089 Other allergic rhinitis: Secondary | ICD-10-CM | POA: Diagnosis not present

## 2019-12-03 DIAGNOSIS — J301 Allergic rhinitis due to pollen: Secondary | ICD-10-CM | POA: Diagnosis not present

## 2019-12-17 ENCOUNTER — Ambulatory Visit (INDEPENDENT_AMBULATORY_CARE_PROVIDER_SITE_OTHER): Payer: Medicare HMO

## 2019-12-17 DIAGNOSIS — I495 Sick sinus syndrome: Secondary | ICD-10-CM

## 2019-12-17 LAB — CUP PACEART REMOTE DEVICE CHECK
Battery Remaining Percentage: 70 %
Brady Statistic RA Percent Paced: 44 %
Brady Statistic RV Percent Paced: 0 %
Date Time Interrogation Session: 20211021064732
Implantable Lead Implant Date: 20170420
Implantable Lead Implant Date: 20170420
Implantable Lead Location: 753859
Implantable Lead Location: 753860
Implantable Lead Model: 377
Implantable Lead Model: 377
Implantable Lead Serial Number: 49378160
Implantable Lead Serial Number: 49421706
Implantable Pulse Generator Implant Date: 20170420
Lead Channel Impedance Value: 429 Ohm
Lead Channel Impedance Value: 624 Ohm
Lead Channel Pacing Threshold Amplitude: 0.8 V
Lead Channel Pacing Threshold Amplitude: 0.9 V
Lead Channel Pacing Threshold Pulse Width: 0.4 ms
Lead Channel Pacing Threshold Pulse Width: 0.4 ms
Lead Channel Sensing Intrinsic Amplitude: 10.4 mV
Lead Channel Sensing Intrinsic Amplitude: 4.4 mV
Lead Channel Setting Pacing Amplitude: 2 V
Lead Channel Setting Pacing Amplitude: 2.4 V
Lead Channel Setting Pacing Pulse Width: 0.4 ms
Pulse Gen Model: 394969
Pulse Gen Serial Number: 68713503

## 2019-12-18 DIAGNOSIS — R69 Illness, unspecified: Secondary | ICD-10-CM | POA: Diagnosis not present

## 2019-12-22 DIAGNOSIS — Z950 Presence of cardiac pacemaker: Secondary | ICD-10-CM | POA: Diagnosis not present

## 2019-12-22 DIAGNOSIS — R69 Illness, unspecified: Secondary | ICD-10-CM | POA: Diagnosis not present

## 2019-12-22 DIAGNOSIS — D6869 Other thrombophilia: Secondary | ICD-10-CM | POA: Diagnosis not present

## 2019-12-22 DIAGNOSIS — I480 Paroxysmal atrial fibrillation: Secondary | ICD-10-CM | POA: Diagnosis not present

## 2019-12-22 DIAGNOSIS — R7301 Impaired fasting glucose: Secondary | ICD-10-CM | POA: Diagnosis not present

## 2019-12-22 DIAGNOSIS — M810 Age-related osteoporosis without current pathological fracture: Secondary | ICD-10-CM | POA: Diagnosis not present

## 2019-12-22 DIAGNOSIS — E785 Hyperlipidemia, unspecified: Secondary | ICD-10-CM | POA: Diagnosis not present

## 2019-12-22 DIAGNOSIS — J302 Other seasonal allergic rhinitis: Secondary | ICD-10-CM | POA: Diagnosis not present

## 2019-12-22 DIAGNOSIS — R350 Frequency of micturition: Secondary | ICD-10-CM | POA: Diagnosis not present

## 2019-12-22 DIAGNOSIS — J45909 Unspecified asthma, uncomplicated: Secondary | ICD-10-CM | POA: Diagnosis not present

## 2019-12-23 DIAGNOSIS — Z1231 Encounter for screening mammogram for malignant neoplasm of breast: Secondary | ICD-10-CM | POA: Diagnosis not present

## 2019-12-23 NOTE — Progress Notes (Signed)
Remote pacemaker transmission.   

## 2019-12-28 DIAGNOSIS — J301 Allergic rhinitis due to pollen: Secondary | ICD-10-CM | POA: Diagnosis not present

## 2019-12-28 DIAGNOSIS — J3089 Other allergic rhinitis: Secondary | ICD-10-CM | POA: Diagnosis not present

## 2020-01-18 DIAGNOSIS — J3089 Other allergic rhinitis: Secondary | ICD-10-CM | POA: Diagnosis not present

## 2020-01-18 DIAGNOSIS — J301 Allergic rhinitis due to pollen: Secondary | ICD-10-CM | POA: Diagnosis not present

## 2020-01-20 ENCOUNTER — Other Ambulatory Visit: Payer: Self-pay

## 2020-01-20 MED ORDER — APIXABAN 5 MG PO TABS
5.0000 mg | ORAL_TABLET | Freq: Two times a day (BID) | ORAL | 1 refills | Status: DC
Start: 2020-01-20 — End: 2020-07-26

## 2020-02-08 DIAGNOSIS — J301 Allergic rhinitis due to pollen: Secondary | ICD-10-CM | POA: Diagnosis not present

## 2020-02-08 DIAGNOSIS — J3089 Other allergic rhinitis: Secondary | ICD-10-CM | POA: Diagnosis not present

## 2020-03-01 DIAGNOSIS — J301 Allergic rhinitis due to pollen: Secondary | ICD-10-CM | POA: Diagnosis not present

## 2020-03-01 DIAGNOSIS — J3089 Other allergic rhinitis: Secondary | ICD-10-CM | POA: Diagnosis not present

## 2020-03-17 ENCOUNTER — Ambulatory Visit (INDEPENDENT_AMBULATORY_CARE_PROVIDER_SITE_OTHER): Payer: Medicare HMO

## 2020-03-17 DIAGNOSIS — I495 Sick sinus syndrome: Secondary | ICD-10-CM | POA: Diagnosis not present

## 2020-03-17 LAB — CUP PACEART REMOTE DEVICE CHECK
Battery Remaining Percentage: 70 %
Brady Statistic RA Percent Paced: 43 %
Brady Statistic RV Percent Paced: 0 %
Date Time Interrogation Session: 20220119070035
Implantable Lead Implant Date: 20170420
Implantable Lead Implant Date: 20170420
Implantable Lead Location: 753859
Implantable Lead Location: 753860
Implantable Lead Model: 377
Implantable Lead Model: 377
Implantable Lead Serial Number: 49378160
Implantable Lead Serial Number: 49421706
Implantable Pulse Generator Implant Date: 20170420
Lead Channel Impedance Value: 449 Ohm
Lead Channel Impedance Value: 644 Ohm
Lead Channel Pacing Threshold Amplitude: 0.9 V
Lead Channel Pacing Threshold Pulse Width: 0.4 ms
Lead Channel Sensing Intrinsic Amplitude: 10.2 mV
Lead Channel Sensing Intrinsic Amplitude: 3 mV
Lead Channel Setting Pacing Amplitude: 2 V
Lead Channel Setting Pacing Amplitude: 2.4 V
Lead Channel Setting Pacing Pulse Width: 0.4 ms
Pulse Gen Model: 394969
Pulse Gen Serial Number: 68713503

## 2020-03-22 DIAGNOSIS — J3089 Other allergic rhinitis: Secondary | ICD-10-CM | POA: Diagnosis not present

## 2020-03-22 DIAGNOSIS — J301 Allergic rhinitis due to pollen: Secondary | ICD-10-CM | POA: Diagnosis not present

## 2020-03-23 DIAGNOSIS — J301 Allergic rhinitis due to pollen: Secondary | ICD-10-CM | POA: Diagnosis not present

## 2020-03-30 NOTE — Progress Notes (Signed)
Remote pacemaker transmission.   

## 2020-04-04 DIAGNOSIS — J3081 Allergic rhinitis due to animal (cat) (dog) hair and dander: Secondary | ICD-10-CM | POA: Diagnosis not present

## 2020-04-04 DIAGNOSIS — J3089 Other allergic rhinitis: Secondary | ICD-10-CM | POA: Diagnosis not present

## 2020-04-04 DIAGNOSIS — J301 Allergic rhinitis due to pollen: Secondary | ICD-10-CM | POA: Diagnosis not present

## 2020-04-04 DIAGNOSIS — J4520 Mild intermittent asthma, uncomplicated: Secondary | ICD-10-CM | POA: Diagnosis not present

## 2020-04-12 DIAGNOSIS — J301 Allergic rhinitis due to pollen: Secondary | ICD-10-CM | POA: Diagnosis not present

## 2020-04-12 DIAGNOSIS — J3089 Other allergic rhinitis: Secondary | ICD-10-CM | POA: Diagnosis not present

## 2020-04-14 DIAGNOSIS — J301 Allergic rhinitis due to pollen: Secondary | ICD-10-CM | POA: Diagnosis not present

## 2020-04-20 DIAGNOSIS — J301 Allergic rhinitis due to pollen: Secondary | ICD-10-CM | POA: Diagnosis not present

## 2020-05-04 DIAGNOSIS — J301 Allergic rhinitis due to pollen: Secondary | ICD-10-CM | POA: Diagnosis not present

## 2020-05-04 DIAGNOSIS — J3089 Other allergic rhinitis: Secondary | ICD-10-CM | POA: Diagnosis not present

## 2020-05-12 DIAGNOSIS — E785 Hyperlipidemia, unspecified: Secondary | ICD-10-CM | POA: Diagnosis not present

## 2020-05-12 DIAGNOSIS — M810 Age-related osteoporosis without current pathological fracture: Secondary | ICD-10-CM | POA: Diagnosis not present

## 2020-05-16 DIAGNOSIS — H9319 Tinnitus, unspecified ear: Secondary | ICD-10-CM | POA: Diagnosis not present

## 2020-05-16 DIAGNOSIS — I480 Paroxysmal atrial fibrillation: Secondary | ICD-10-CM | POA: Diagnosis not present

## 2020-05-16 DIAGNOSIS — E785 Hyperlipidemia, unspecified: Secondary | ICD-10-CM | POA: Diagnosis not present

## 2020-05-16 DIAGNOSIS — M810 Age-related osteoporosis without current pathological fracture: Secondary | ICD-10-CM | POA: Diagnosis not present

## 2020-05-16 DIAGNOSIS — M1990 Unspecified osteoarthritis, unspecified site: Secondary | ICD-10-CM | POA: Diagnosis not present

## 2020-05-16 DIAGNOSIS — Z1331 Encounter for screening for depression: Secondary | ICD-10-CM | POA: Diagnosis not present

## 2020-05-16 DIAGNOSIS — R82998 Other abnormal findings in urine: Secondary | ICD-10-CM | POA: Diagnosis not present

## 2020-05-16 DIAGNOSIS — Z950 Presence of cardiac pacemaker: Secondary | ICD-10-CM | POA: Diagnosis not present

## 2020-05-16 DIAGNOSIS — Z0000 Encounter for general adult medical examination without abnormal findings: Secondary | ICD-10-CM | POA: Diagnosis not present

## 2020-05-16 DIAGNOSIS — D6869 Other thrombophilia: Secondary | ICD-10-CM | POA: Diagnosis not present

## 2020-05-16 DIAGNOSIS — R69 Illness, unspecified: Secondary | ICD-10-CM | POA: Diagnosis not present

## 2020-05-20 DIAGNOSIS — J3081 Allergic rhinitis due to animal (cat) (dog) hair and dander: Secondary | ICD-10-CM | POA: Diagnosis not present

## 2020-05-20 DIAGNOSIS — J3089 Other allergic rhinitis: Secondary | ICD-10-CM | POA: Diagnosis not present

## 2020-05-20 DIAGNOSIS — J301 Allergic rhinitis due to pollen: Secondary | ICD-10-CM | POA: Diagnosis not present

## 2020-05-24 DIAGNOSIS — J3089 Other allergic rhinitis: Secondary | ICD-10-CM | POA: Diagnosis not present

## 2020-05-24 DIAGNOSIS — J301 Allergic rhinitis due to pollen: Secondary | ICD-10-CM | POA: Diagnosis not present

## 2020-06-01 ENCOUNTER — Ambulatory Visit: Payer: Medicare HMO | Admitting: Internal Medicine

## 2020-06-01 ENCOUNTER — Other Ambulatory Visit: Payer: Self-pay

## 2020-06-01 ENCOUNTER — Encounter: Payer: Self-pay | Admitting: Internal Medicine

## 2020-06-01 VITALS — BP 122/70 | HR 73

## 2020-06-01 DIAGNOSIS — I480 Paroxysmal atrial fibrillation: Secondary | ICD-10-CM

## 2020-06-01 DIAGNOSIS — Z950 Presence of cardiac pacemaker: Secondary | ICD-10-CM | POA: Diagnosis not present

## 2020-06-01 DIAGNOSIS — I10 Essential (primary) hypertension: Secondary | ICD-10-CM | POA: Insufficient documentation

## 2020-06-01 DIAGNOSIS — I495 Sick sinus syndrome: Secondary | ICD-10-CM

## 2020-06-01 NOTE — Patient Instructions (Signed)
Medication Instructions:  Your physician recommends that you continue on your current medications as directed. Please refer to the Current Medication list given to you today.  Labwork: None ordered.  Testing/Procedures: None ordered.  Follow-Up: Your physician wants you to follow-up in: one year with Cristopher Peru, MD or one of the following Advanced Practice Providers on your designated Care Team:    Chanetta Marshall, NP  Tommye Standard, PA-C  Legrand Como "Jonni Sanger" [place], [place]  Remote monitoring is used to monitor your Pacemaker from home. This monitoring reduces the number of office visits required to check your device to one time per year. It allows [place] to keep an eye on the functioning of your device to ensure it is working properly. You are scheduled for a device check from home on 06/16/2020. You may send your transmission at any time that day. If you have a wireless device, the transmission will be sent automatically. After your physician reviews your transmission, you will receive a postcard with your next transmission date.  Any Other Special Instructions Will Be Listed Below (If Applicable).  If you need a refill on your cardiac medications before your next appointment, please call your pharmacy.

## 2020-06-01 NOTE — Progress Notes (Signed)
HPI Danielle Lucas returns today for followup of her atrial arrhythmias. She is a pleasant 80 yo woman with a h/o CHB, s/p PPM insertion, PAF,HTN, and NSVT. In the interim, she notes that she has not had more arrhythmia and denies chest pain or sob. She has started anti-coagulation with eliquis. She has not had any bleeding problems. She is pending a trip to [place]. Allergies  Allergen Reactions  . Other Anaphylaxis    Cat dander  . Codeine Nausea And Vomiting  . Dilaudid [Hydromorphone Hcl] Nausea And Vomiting  . Morphine And Related Nausea And Vomiting     Current Outpatient Medications  Medication Sig Dispense Refill  . apixaban (ELIQUIS) 5 MG TABS tablet Take 1 tablet (5 mg total) by mouth 2 (two) times daily. 180 tablet 1  . B Complex Vitamins (VITAMIN-B COMPLEX PO) Take 1 capsule by mouth daily.     . Calcium-Vitamin D-Vitamin K 500-500-40 MG-UNT-MCG CHEW Chew 1 capsule by mouth 2 (two) times daily.     . Cholecalciferol (VITAMIN D) 2000 UNITS CAPS Take 2,000 Units by mouth daily.     . Coenzyme Q10 (COQ10 PO) Take by mouth.    . escitalopram (LEXAPRO) 10 MG tablet Take 5 mg by mouth daily. Takes 1 tablet twice weekly    . ipratropium (ATROVENT) 0.06 % nasal spray Place 1 spray into both nostrils 4 (four) times daily as needed for rhinitis.    Marland Kitchen levocetirizine (XYZAL) 5 MG tablet Take 5 mg by mouth every evening.    . metoprolol succinate (TOPROL-XL) 25 MG 24 hr tablet Take 1 tablet (25 mg total) by mouth daily. 180 tablet 3  . pravastatin (PRAVACHOL) 20 MG tablet Take 1 tablet (20 mg total) by mouth every evening. Take four times weekly.    . TURMERIC PO Take 15 mLs by mouth daily.     . valsartan (DIOVAN) 80 MG tablet Take 80 mg by mouth daily.    . VENTOLIN HFA 108 (90 BASE) MCG/ACT inhaler Inhale 1-2 puffs into the lungs daily as needed for wheezing or shortness of breath.     . EPIPEN 2-PAK 0.3 MG/0.3ML SOAJ injection Inject 0.3 mg into the muscle as directed. Reported  on 04/12/2015    . Zoledronic Acid (RECLAST IV) Inject into the vein See admin instructions. Uses every other year. Unknown Dose     No current facility-administered medications for this visit.     Past Medical History:  Diagnosis Date  . Abnormal Pap smear of cervix   . Acute respiratory failure with hypoxia ([place])   . Asthma    "I'll have an attack if I'm around cat or dust"  . Asystole ([place])   . Atrial fibrillation ([place])   . CHB (complete heart block) ([place]) 05/2015   s/p Biotronik (serial number [id number]) dual lead pacemaker  . Depression   . Exercise-induced asthma   . Hx of colonic polyps   . Hypertension   . Osteopenia   . PONV (postoperative nausea and vomiting)    pt. thinks it was from dilaudid  . Presence of permanent cardiac pacemaker     ROS:   All systems reviewed and negative except as noted in the HPI.   Past Surgical History:  Procedure Laterality Date  . ABDOMINAL HYSTERECTOMY N/A 07/28/2013   Procedure: HYSTERECTOMY ABDOMINAL with SALPINGO OOPHORECTOMY;  Surgeon: Lyman Speller, MD;  Location: [facility];  Service: Gynecology;  Laterality: N/A;  request 4.5 hours  . ABDOMINAL  SACROCOLPOPEXY N/A 07/28/2013   Procedure: ABDOMINO SACROCOLPOPEXY;  Surgeon: Jamey Reas de Berton Lan, MD;  Location: [facility];  Service: Gynecology;  Laterality: N/A;  . APPENDECTOMY    . BLADDER SUSPENSION N/A 07/28/2013   Procedure: TRANSVAGINAL TAPE (TVT) PROCEDURE midurethral sling;  Surgeon: Everardo All Amundson de Berton Lan, MD;  Location: [facility];  Service: Gynecology;  Laterality: N/A;  . CHOLECYSTECTOMY OPEN    . CRYOTHERAPY     for abnormal pap smear yrs ago  . CYSTOSCOPY N/A 07/28/2013   Procedure: CYSTOSCOPY;  Surgeon: Jamey Reas de Berton Lan, MD;  Location: [facility];  Service: Gynecology;  Laterality: N/A;  . EP IMPLANTABLE DEVICE N/A 06/16/2015   Procedure: Pacemaker Implant;  Surgeon: Evans Lance, MD; Biotronik (serial number [id number]) pacemaker;  Laterality: Left  . EP IMPLANTABLE DEVICE N/A 06/16/2015   Procedure: Loop Recorder Removal;  Surgeon: Evans Lance, MD;  Location: [facility];  Service: Cardiovascular;  Laterality: N/A;  . FRACTURE SURGERY    . INSERT / REPLACE / REMOVE PACEMAKER  06/16/2015  . LOOP RECORDER IMPLANT N/A 10/21/2013   Procedure: LOOP RECORDER IMPLANT;  Surgeon: Evans Lance, MD;  [facility]: [facility];  Service: Cardiovascular;  Laterality: N/A;  . LOOP RECORDER REMOVAL  06/16/2015  . TEMPORARY PACEMAKER INSERTION N/A 07/29/2013   Procedure: TEMPORARY PACEMAKER INSERTION;  Surgeon: Wellington Hampshire, MD;  Location: [facility];  Service: Cardiovascular;  Laterality: N/A;  . TONSILLECTOMY  1940s  . TOTAL ABDOMINAL HYSTERECTOMY W/ BILATERAL SALPINGOOPHORECTOMY  2015   Abdominal sacrocolpopexy, tension-free vaginal tape Exact, cystoscopy   . TUBAL LIGATION    . WRIST FRACTURE SURGERY Left    plate inserted     Family History  Problem Relation Age of Onset  . Varicose Veins Mother   . Heart attack Father   . Cancer Brother        colon cancer (polyps)     Social History   Socioeconomic History  . Marital status: Divorced    Spouse name: Not on file  . Number of children: Not on file  . Years of education: Not on file  . Highest education level: Not on file  Occupational History  . Not on file  Tobacco Use  . Smoking status: Former Smoker    Packs/day: 1.00    Years: 30.00    Pack years: 30.00    Types: Cigarettes    Quit date: 07/02/1988    Years since quitting: 31.9  . Smokeless tobacco: Never Used  Vaping Use  . Vaping Use: Never used  Substance and Sexual Activity  . Alcohol use: Yes    Alcohol/week: 0.0 - 1.0 standard drinks  . Drug use: No  . Sexual activity: Not Currently    Partners: Male    Birth control/protection: Post-menopausal, Surgical    Comment: TAH/BSO  Other Topics Concern  . Not on file  Social History Narrative  . Not on file   Social Determinants of  Health   Financial Resource Strain: Not on file  Food Insecurity: Not on file  Transportation Needs: Not on file  Physical Activity: Not on file  Stress: Not on file  Social Connections: Not on file  Intimate Partner Violence: Not on file     BP 122/70   Pulse 73   LMP 02/27/2000   Physical Exam:  Well appearing NAD HEENT: Unremarkable Neck:  No JVD, no thyromegally Lymphatics:  No adenopathy Back:  No CVA tenderness Lungs:  Clear HEART:  Regular rate rhythm, no murmurs, no rubs, no clicks Abd:  soft, positive bowel sounds, no organomegally, no rebound, no guarding Ext:  2 plus pulses, no edema, no cyanosis, no clubbing Skin:  No rashes no nodules Neuro:  CN II through XII intact, motor grossly intact  EKG - nsr with atrial pacing  DEVICE  Normal device function.  See PaceArt for details.   Assess/Plan: 1. PAF - she is maintaining NSR. No change in her meds. 2. HTN - her bp is well controlled. 3. Sinus node dysfunction - she is atrial pacing about half of the time. 4. PPM - her Biotronik DDD PM is working normally.  Danielle Overlie Maricarmen Braziel,MD

## 2020-06-07 DIAGNOSIS — Z961 Presence of intraocular lens: Secondary | ICD-10-CM | POA: Diagnosis not present

## 2020-06-07 DIAGNOSIS — H5212 Myopia, left eye: Secondary | ICD-10-CM | POA: Diagnosis not present

## 2020-06-10 DIAGNOSIS — K219 Gastro-esophageal reflux disease without esophagitis: Secondary | ICD-10-CM | POA: Diagnosis not present

## 2020-06-10 DIAGNOSIS — Z7901 Long term (current) use of anticoagulants: Secondary | ICD-10-CM | POA: Diagnosis not present

## 2020-06-10 DIAGNOSIS — R69 Illness, unspecified: Secondary | ICD-10-CM | POA: Diagnosis not present

## 2020-06-10 DIAGNOSIS — M1990 Unspecified osteoarthritis, unspecified site: Secondary | ICD-10-CM | POA: Diagnosis not present

## 2020-06-10 DIAGNOSIS — D6869 Other thrombophilia: Secondary | ICD-10-CM | POA: Diagnosis not present

## 2020-06-10 DIAGNOSIS — J45909 Unspecified asthma, uncomplicated: Secondary | ICD-10-CM | POA: Diagnosis not present

## 2020-06-10 DIAGNOSIS — E785 Hyperlipidemia, unspecified: Secondary | ICD-10-CM | POA: Diagnosis not present

## 2020-06-10 DIAGNOSIS — I10 Essential (primary) hypertension: Secondary | ICD-10-CM | POA: Diagnosis not present

## 2020-06-10 DIAGNOSIS — G8929 Other chronic pain: Secondary | ICD-10-CM | POA: Diagnosis not present

## 2020-06-10 DIAGNOSIS — I4891 Unspecified atrial fibrillation: Secondary | ICD-10-CM | POA: Diagnosis not present

## 2020-06-14 DIAGNOSIS — J3089 Other allergic rhinitis: Secondary | ICD-10-CM | POA: Diagnosis not present

## 2020-06-14 DIAGNOSIS — J301 Allergic rhinitis due to pollen: Secondary | ICD-10-CM | POA: Diagnosis not present

## 2020-06-16 ENCOUNTER — Ambulatory Visit (INDEPENDENT_AMBULATORY_CARE_PROVIDER_SITE_OTHER): Payer: Medicare HMO

## 2020-06-16 DIAGNOSIS — I480 Paroxysmal atrial fibrillation: Secondary | ICD-10-CM | POA: Diagnosis not present

## 2020-06-16 LAB — CUP PACEART REMOTE DEVICE CHECK
Date Time Interrogation Session: 20220421071640
Implantable Lead Implant Date: 20170420
Implantable Lead Implant Date: 20170420
Implantable Lead Location: 753859
Implantable Lead Location: 753860
Implantable Lead Model: 377
Implantable Lead Model: 377
Implantable Lead Serial Number: 49378160
Implantable Lead Serial Number: 49421706
Implantable Pulse Generator Implant Date: 20170420
Pulse Gen Model: 394969
Pulse Gen Serial Number: 68713503

## 2020-06-20 ENCOUNTER — Telehealth: Payer: Self-pay | Admitting: Emergency Medicine

## 2020-06-20 NOTE — Telephone Encounter (Signed)
Biotronik alert for AF episode since 06/18/20. Hx PAF, + Eliquis, Toprol XL 25 mg daily. Patient reports she is asymptomatic and has missed no doses of medication. She will contact the office if she develops SOB or any other symptoms.

## 2020-07-05 NOTE — Progress Notes (Signed)
Remote pacemaker transmission.   

## 2020-07-06 DIAGNOSIS — J301 Allergic rhinitis due to pollen: Secondary | ICD-10-CM | POA: Diagnosis not present

## 2020-07-08 DIAGNOSIS — J301 Allergic rhinitis due to pollen: Secondary | ICD-10-CM | POA: Diagnosis not present

## 2020-07-08 DIAGNOSIS — J3089 Other allergic rhinitis: Secondary | ICD-10-CM | POA: Diagnosis not present

## 2020-07-25 ENCOUNTER — Other Ambulatory Visit: Payer: Self-pay | Admitting: Internal Medicine

## 2020-07-26 NOTE — Telephone Encounter (Signed)
Pt's age 80, wt 61.7 kg, SCr 0.89, CrCl 50.74, last ov w/ GT 06/01/20.

## 2020-07-27 DIAGNOSIS — J301 Allergic rhinitis due to pollen: Secondary | ICD-10-CM | POA: Diagnosis not present

## 2020-07-27 DIAGNOSIS — J3089 Other allergic rhinitis: Secondary | ICD-10-CM | POA: Diagnosis not present

## 2020-08-18 ENCOUNTER — Ambulatory Visit (HOSPITAL_BASED_OUTPATIENT_CLINIC_OR_DEPARTMENT_OTHER): Payer: Medicare HMO | Admitting: Obstetrics & Gynecology

## 2020-09-12 ENCOUNTER — Encounter (HOSPITAL_BASED_OUTPATIENT_CLINIC_OR_DEPARTMENT_OTHER): Payer: Self-pay | Admitting: Obstetrics & Gynecology

## 2020-09-12 ENCOUNTER — Other Ambulatory Visit: Payer: Self-pay

## 2020-09-12 ENCOUNTER — Ambulatory Visit (INDEPENDENT_AMBULATORY_CARE_PROVIDER_SITE_OTHER): Payer: Medicare HMO | Admitting: Obstetrics & Gynecology

## 2020-09-12 ENCOUNTER — Telehealth: Payer: Self-pay | Admitting: Internal Medicine

## 2020-09-12 VITALS — BP 132/77 | HR 75 | Ht 62.5 in | Wt 141.6 lb

## 2020-09-12 DIAGNOSIS — Z86010 Personal history of colonic polyps: Secondary | ICD-10-CM

## 2020-09-12 DIAGNOSIS — Z780 Asymptomatic menopausal state: Secondary | ICD-10-CM | POA: Diagnosis not present

## 2020-09-12 DIAGNOSIS — R351 Nocturia: Secondary | ICD-10-CM

## 2020-09-12 DIAGNOSIS — M8580 Other specified disorders of bone density and structure, unspecified site: Secondary | ICD-10-CM | POA: Diagnosis not present

## 2020-09-12 DIAGNOSIS — Z01419 Encounter for gynecological examination (general) (routine) without abnormal findings: Secondary | ICD-10-CM | POA: Diagnosis not present

## 2020-09-12 DIAGNOSIS — Z90710 Acquired absence of both cervix and uterus: Secondary | ICD-10-CM | POA: Diagnosis not present

## 2020-09-12 MED ORDER — MIRABEGRON ER 50 MG PO TB24: 50.0000 mg | ORAL_TABLET | Freq: Every day | ORAL | 1 refills | Status: DC

## 2020-09-12 NOTE — Telephone Encounter (Signed)
Will forward to both Dr Tanna Furry nurse and Jeani Hawking Via, LPN with pt assistance to see if there is anything available for assistance.

## 2020-09-12 NOTE — Progress Notes (Signed)
80 y.o. G75P2002 Divorced White or Caucasian female here for breast and pelvic exam.  Doing well.  Denies vaginal bleeding.  Just got back from a riverboat cruise on the [place].  Also went to [place].    On Reclast and receiving every other year.  Has BMD scheduled later this year.  Gets up at night 2-3 times.  Has a lot of urgency and pressure.    Patient's last menstrual period was 02/27/2000.          Sexually active: No.  H/O STD:  no  Health Maintenance: PCP:  Dr. Joylene Draft.  Last wellness appt was 03/2020.  Did blood work at that appt:  yes Vaccines are up to date:  pt is sure these are all up to date.  I do not have pneumonia vaccination dates. Colonoscopy:  2017.  Follow up 5 years due to polyps.  Dr. Benson Norway MMG:  12/22/2018 BMD:  does with Dr. Joylene Draft Last pap smear:  not indicated.   H/o abnormal pap smear: remote hx   reports that she quit smoking about 32 years ago. Her smoking use included cigarettes. She has a 30.00 pack-year smoking history. She has never used smokeless tobacco. She reports current alcohol use. She reports that she does not use drugs.  Past Medical History:  Diagnosis Date   Abnormal Pap smear of cervix    Acute respiratory failure with hypoxia ([facility])    Asthma    "I'll have an attack if I'm around cat or dust"   Asystole ([facility])    Atrial fibrillation ([place])    CHB (complete heart block) ([place]) 05/2015   s/p Biotronik (serial number [id number]) dual lead pacemaker   Depression    Exercise-induced asthma    Hx of colonic polyps    Hypertension    Osteopenia    PONV (postoperative nausea and vomiting)    pt. thinks it was from dilaudid   Presence of permanent cardiac pacemaker     Past Surgical History:  Procedure Laterality Date   ABDOMINAL HYSTERECTOMY N/A 07/28/2013   Procedure: HYSTERECTOMY ABDOMINAL with SALPINGO OOPHORECTOMY;  Surgeon: Lyman Speller, MD;  Location: [facility];  Service: Gynecology;  Laterality: N/A;  request 4.5 hours    ABDOMINAL SACROCOLPOPEXY N/A 07/28/2013   Procedure: ABDOMINO SACROCOLPOPEXY;  Surgeon: Jamey Reas de Berton Lan, MD;  Location: [facility];  Service: Gynecology;  Laterality: N/A;   APPENDECTOMY     BLADDER SUSPENSION N/A 07/28/2013   Procedure: TRANSVAGINAL TAPE (TVT) PROCEDURE midurethral sling;  Surgeon: Everardo All Amundson de Berton Lan, MD;  Location: [facility];  Service: Gynecology;  Laterality: N/A;   CHOLECYSTECTOMY OPEN     CRYOTHERAPY     for abnormal pap smear yrs ago   CYSTOSCOPY N/A 07/28/2013   Procedure: CYSTOSCOPY;  Surgeon: Jamey Reas de Berton Lan, MD;  Location: [facility];  Service: Gynecology;  Laterality: N/A;   EP IMPLANTABLE DEVICE N/A 06/16/2015   Procedure: Pacemaker Implant;  Surgeon: Evans Lance, MD; Biotronik (serial number [id number]) pacemaker; Laterality: Left   EP IMPLANTABLE DEVICE N/A 06/16/2015   Procedure: Loop Recorder Removal;  Surgeon: Evans Lance, MD;  Location: [facility];  Service: Cardiovascular;  Laterality: N/A;   FRACTURE SURGERY     INSERT / REPLACE / REMOVE PACEMAKER  06/16/2015   LOOP RECORDER IMPLANT N/A 10/21/2013   Procedure: LOOP RECORDER IMPLANT;  Surgeon: Evans Lance, MD;  Location: [facility] CATH LAB;  Service:  Cardiovascular;  Laterality: N/A;   LOOP RECORDER REMOVAL  06/16/2015   TEMPORARY PACEMAKER INSERTION N/A 07/29/2013   Procedure: TEMPORARY PACEMAKER INSERTION;  Surgeon: Wellington Hampshire, MD;  Location: [facility];  Service: Cardiovascular;  Laterality: N/A;   TONSILLECTOMY  1940s   TOTAL ABDOMINAL HYSTERECTOMY W/ BILATERAL SALPINGOOPHORECTOMY  2015   Abdominal sacrocolpopexy, tension-free vaginal tape Exact, cystoscopy    TUBAL LIGATION     WRIST FRACTURE SURGERY Left    plate inserted    Current Outpatient Medications  Medication Sig Dispense Refill   B Complex Vitamins (VITAMIN-B COMPLEX PO) Take 1 capsule by mouth daily.      Calcium-Vitamin D-Vitamin K 024-097-35 MG-UNT-MCG CHEW Chew 1 capsule by  mouth 2 (two) times daily.      Cholecalciferol (VITAMIN D) 2000 UNITS CAPS Take 2,000 Units by mouth daily.      Coenzyme Q10 (COQ10 PO) Take by mouth.     ELIQUIS 5 MG TABS tablet TAKE 1 TABLET BY MOUTH TWICE A DAY 180 tablet 1   EPIPEN 2-PAK 0.3 MG/0.3ML SOAJ injection Inject 0.3 mg into the muscle as directed. Reported on 04/12/2015     escitalopram (LEXAPRO) 10 MG tablet Take 5 mg by mouth daily. Takes 1 tablet twice weekly     ipratropium (ATROVENT) 0.06 % nasal spray Place 1 spray into both nostrils 4 (four) times daily as needed for rhinitis.     metoprolol succinate (TOPROL-XL) 25 MG 24 hr tablet Take 1 tablet (25 mg total) by mouth daily. 180 tablet 3   pravastatin (PRAVACHOL) 20 MG tablet Take 1 tablet (20 mg total) by mouth every evening. Take four times weekly.     TURMERIC PO Take 15 mLs by mouth daily.      valsartan (DIOVAN) 80 MG tablet Take 80 mg by mouth daily.     VENTOLIN HFA 108 (90 BASE) MCG/ACT inhaler Inhale 1-2 puffs into the lungs daily as needed for wheezing or shortness of breath.      Zoledronic Acid (RECLAST IV) Inject into the vein See admin instructions. Uses every other year. Unknown Dose     levocetirizine (XYZAL) 5 MG tablet Take 5 mg by mouth every evening. (Patient not taking: Reported on 09/12/2020)     No current facility-administered medications for this visit.    Family History  Problem Relation Age of Onset   Varicose Veins Mother    Heart attack Father    Cancer Brother        colon cancer (polyps)    Review of Systems  All other systems reviewed and are negative.  Exam:   BP 132/77 (BP Location: Right Arm, Patient Position: Sitting, Cuff Size: Large)   Pulse 75   Ht 5' 2.5" (1.588 m)   Wt 141 lb 9.6 oz (64.2 kg)   LMP 02/27/2000   BMI 25.49 kg/m   Height: 5' 2.5" (158.8 cm)  General appearance: alert, cooperative and appears stated age Breasts: normal appearance, no masses or tenderness Abdomen: soft, non-tender; bowel sounds normal;  no masses,  no organomegaly Lymph nodes: Cervical, supraclavicular, and axillary nodes normal.  No abnormal inguinal nodes palpated Neurologic: Grossly normal  Pelvic: External genitalia:  no lesions              Urethra:  normal appearing urethra with no masses, tenderness or lesions              Bartholins and Skenes: normal  Vagina: normal appearing vagina with atrophic changes and no discharge, no lesions, cystocele noted today              Cervix: absent              Pap taken: No. Bimanual Exam:  Uterus:  uterus absent              Adnexa: no mass, fullness, tenderness               Rectovaginal: Confirms               Anus:  normal sphincter tone, no lesions  Chaperone, Octaviano Batty, CMA, was present for exam.  Assessment/Plan: 1. Gynecologic exam normal - pap smear not indicated - will get copy of MMG from [facility] from 2021, not due yet - colonoscopy due this year.  Pt aware.  States she will call - bmd done with Dr. Joylene Draft - vaccines reviewed - lab work done with Dr. Joylene Draft   2. Postmenopausal - no HRT  3. H/O abdominal hysterectomy - H/o TAH/BSO, abdomino-sacrocolpopexy, cystoscopy 2016  4. Osteopenia, unspecified location - on Reclast  5. History of colon polyps - pt aware colonoscopy is due  6.  Nocturia, urinary urgency - Trial of myrbetriq 50mg  daily.  #30/1RF.  Pt will monitor BP and let me know if BP is elevated.  Will give updated in 30 days.

## 2020-09-12 NOTE — Telephone Encounter (Signed)
Pt c/o medication issue:  1. Name of Medication:  ELIQUIS 5 MG TABS tablet  2. How are you currently taking this medication (dosage and times per day)?  As prescribed   3. Are you having a reaction (difficulty breathing--STAT)? No   4. What is your medication issue? Danielle Lucas is calling stating she will be in the doughnut hole by the time she is due to get her next prescription of Eliquis in September. She is wanting to know if there's anything that can be done so she does not have to pay full price. Please advise.

## 2020-09-13 NOTE — Telephone Encounter (Signed)
**Note De-Identified Danielle Lucas Obfuscation** The pt states that she has never applied for pt asst in the past but is interested in applying for asst for her Eliquis. I gave her [employer]'s phone number and advised her to call them with questions concerning their Eliquis program and her eligibility to be approved for the program.  She is aware that if it appears that she is eligible, to request that they mail her a pt asst application to her home and that once she receives it to complete her part, obtain required documents per [employer], and to bring all to Dr Tanna Furry office at [facility] on [address] in [place] to drop off at the front office and that we will take care of the provider page of the application and will fax all to [facility]. She verbalized understanding and thanked me for my assistance.  She is aware to call us back if she is not eligible for approval with [employer] and cannot afford the cost of her Eliquis going forward.

## 2020-09-13 NOTE — Telephone Encounter (Signed)
**Note De-Identified Audrea Bolte Obfuscation** No answer so I left a message on the pts VM asking her to call Jeani Hawking at Dr Tanna Furry office at [facility] at [phone].

## 2020-09-14 LAB — CUP PACEART REMOTE DEVICE CHECK
Battery Remaining Percentage: 65 %
Brady Statistic RA Percent Paced: 43 %
Brady Statistic RV Percent Paced: 0 %
Date Time Interrogation Session: 20220719090817
Implantable Lead Implant Date: 20170420
Implantable Lead Implant Date: 20170420
Implantable Lead Location: 753859
Implantable Lead Location: 753860
Implantable Lead Model: 377
Implantable Lead Model: 377
Implantable Lead Serial Number: 49378160
Implantable Lead Serial Number: 49421706
Implantable Pulse Generator Implant Date: 20170420
Lead Channel Impedance Value: 449 Ohm
Lead Channel Impedance Value: 644 Ohm
Lead Channel Pacing Threshold Amplitude: 0.9 V
Lead Channel Pacing Threshold Amplitude: 1 V
Lead Channel Pacing Threshold Pulse Width: 0.4 ms
Lead Channel Pacing Threshold Pulse Width: 0.4 ms
Lead Channel Sensing Intrinsic Amplitude: 10.1 mV
Lead Channel Sensing Intrinsic Amplitude: 3.1 mV
Lead Channel Setting Pacing Amplitude: 2 V
Lead Channel Setting Pacing Amplitude: 2.4 V
Lead Channel Setting Pacing Pulse Width: 0.4 ms
Pulse Gen Model: 394969
Pulse Gen Serial Number: 68713503

## 2020-09-15 ENCOUNTER — Ambulatory Visit (INDEPENDENT_AMBULATORY_CARE_PROVIDER_SITE_OTHER): Payer: Medicare HMO

## 2020-09-15 DIAGNOSIS — J301 Allergic rhinitis due to pollen: Secondary | ICD-10-CM | POA: Diagnosis not present

## 2020-09-15 DIAGNOSIS — J069 Acute upper respiratory infection, unspecified: Secondary | ICD-10-CM | POA: Diagnosis not present

## 2020-09-15 DIAGNOSIS — J4520 Mild intermittent asthma, uncomplicated: Secondary | ICD-10-CM | POA: Diagnosis not present

## 2020-09-15 DIAGNOSIS — I495 Sick sinus syndrome: Secondary | ICD-10-CM | POA: Diagnosis not present

## 2020-09-15 DIAGNOSIS — J3089 Other allergic rhinitis: Secondary | ICD-10-CM | POA: Diagnosis not present

## 2020-09-19 DIAGNOSIS — L821 Other seborrheic keratosis: Secondary | ICD-10-CM | POA: Diagnosis not present

## 2020-09-19 DIAGNOSIS — L661 Lichen planopilaris: Secondary | ICD-10-CM | POA: Diagnosis not present

## 2020-09-19 DIAGNOSIS — D2372 Other benign neoplasm of skin of left lower limb, including hip: Secondary | ICD-10-CM | POA: Diagnosis not present

## 2020-09-22 NOTE — Telephone Encounter (Signed)
Patient is following up to provide an update on her patient assistance. She states she contacted [employer], but it didn't work out and she won't be able to go through them.  She would like a call back to discuss alternatives.

## 2020-09-23 NOTE — Telephone Encounter (Signed)
**Note De-Identified Danielle Lucas Obfuscation** The pt cannot afford the cost of Eliquis once she goes into her donut hole at her next refill in 30 days and is not eligible for pt asst through [employer].  She and I discussed her switching to another anticoagulant (If ok with Dr Lovena Le), Xarelto as there is a program, [employer] where she can get Xarelto for $85/30 day supply or $240/90 day supply and she is very interested in this program as she states she can handle that cost.  She is aware that I am forwarding this message to Dr Lovena Le and our pharmacy team for advisement and that I will call her back with recommendation.

## 2020-09-23 NOTE — Telephone Encounter (Signed)
Please switch to Xarelto. GT

## 2020-09-26 NOTE — Telephone Encounter (Addendum)
**Note De-Identified Kiah Keay Obfuscation** No answer so I left a message on the pts VM asking her to call Jeani Hawking back at Dr Forde Dandy office at [phone] and that if she calls backafter 2:15 to either ask to s/w a triage nurseor to leave a message for me and I will call her tomorrow.  FYI: I have already removed Eliquis from the pts med list and replaced it with Xarelto 20 mg #90 with no refills to be sent from this phone note as the order is already attached and just needs to be e-scribed to [facility] (pharmacy for [facility]) AS IS once the pt has been notified of the change.

## 2020-09-27 MED ORDER — RIVAROXABAN 20 MG PO TABS: 20.0000 mg | ORAL_TABLET | Freq: Every day | ORAL | 0 refills | Status: DC

## 2020-09-27 NOTE — Telephone Encounter (Signed)
**Note De-Identified Danielle Lucas Obfuscation** The pt and I discussed pt asst for Xarelto through [employer]. I gave her their phone number and advised her to call them with questions concerning their Xarelto program and her eligibility to be approved.  She is aware that if it appears that she would be approved to request that they mail her an application to her home address and that once she receives it to complete her part, obtain required documents per [employer] (if any), and to bring all to Dr Forde Dandy office at [facility]	[address] in [place] to drop off at the front office and that we will take care of the provider page of her application and will fax all to [employer] [facility].  She is also aware that if she is not eligible for approval for [employer] Pt Asst  to contact [facility] (I provided her their phone number) to enroll in their Xarelto program where she can get her Xarelto from them for $85/30 day supply or $240/90 day supply.  I advised her that I will call her back later today to find out which program she is going to apply/enroll in.  I will send her Xarelto RX to [facility] to fill if she enrolls in [employer] or to her local pharmacy if she applies for asst through [employer].

## 2020-09-27 NOTE — Telephone Encounter (Signed)
**Note De-Identified Charisse Wendell Obfuscation** The pt states that the requirements for [employer] Pt [employer] is the same as [employer] so she does not qualify for Xarelto asst at this time due to her income is over their limit.  She did call [employer] and gave them her ins information and she states that they advised her that they cannot move forward until Xarelto has been prescribed to her and sent in to be filled.  She is aware that I am e-scribing her Xarelto 20 mg #90 to [facility] (pharmacy for [facility]) to fill and I recommended that she call them tomorrow to check on her enrollment/Xarelto refill to be sure we took care of everything they need. She states that she has to work tomorrow and will be unable to call them but will on Thursday.  I did advised the pt that her new prescription is for Xarelto 20 mg and that she is to take 1 tablet daily with supper. She verbalized understanding and repeated direction back to me correctly.  She is aware to call Jeani Hawking at Dr Tanna Furry office at [facility] at [phone] if she needs requires further assistance with her Optometrist.  She thanked me for my help.

## 2020-09-29 ENCOUNTER — Other Ambulatory Visit: Payer: Self-pay | Admitting: Internal Medicine

## 2020-09-29 MED ORDER — RIVAROXABAN 20 MG PO TABS: 20.0000 mg | ORAL_TABLET | Freq: Every day | ORAL | 1 refills | Status: DC

## 2020-09-29 NOTE — Telephone Encounter (Signed)
*  STAT* If patient is at the pharmacy, call can be transferred to refill team.   1. Which medications need to be refilled? (please list name of each medication and dose if known) xarelto   2. Which pharmacy/location (including street and city if local pharmacy) is medication to be sent to? [facility]. Do they need a 30 day or 90 day supply? Not sure   New medication

## 2020-09-29 NOTE — Telephone Encounter (Signed)
Pt last saw Dr Lovena Le 06/01/20, last labs 11/06/19 Creat 0.87, age 80, weight 64.2kg, CrCl 54.01, based on CrCl pt is on appropriate dosage of Xarelto '20mg'$  QD.  Will refill rx.

## 2020-10-10 NOTE — Progress Notes (Signed)
Remote pacemaker transmission.   

## 2020-11-23 DIAGNOSIS — Z86010 Personal history of colonic polyps: Secondary | ICD-10-CM | POA: Diagnosis not present

## 2020-11-23 DIAGNOSIS — I480 Paroxysmal atrial fibrillation: Secondary | ICD-10-CM | POA: Diagnosis not present

## 2020-11-23 DIAGNOSIS — Z800 Family history of malignant neoplasm of digestive organs: Secondary | ICD-10-CM | POA: Diagnosis not present

## 2020-11-24 ENCOUNTER — Other Ambulatory Visit: Payer: Self-pay

## 2020-11-24 ENCOUNTER — Telehealth: Payer: Self-pay | Admitting: *Deleted

## 2020-11-24 MED ORDER — METOPROLOL SUCCINATE ER 25 MG PO TB24: 25.0000 mg | ORAL_TABLET | Freq: Every day | ORAL | 3 refills | Status: DC

## 2020-11-24 NOTE — Telephone Encounter (Signed)
Patient with diagnosis of afib on Xarelto for anticoagulation.    Procedure: colonoscopy Date of procedure: 12/22/20  CHA2DS2-VASc Score = 4   This indicates a 4.8% annual risk of stroke. The patient's score is based upon: CHF History: 0 HTN History: 1 Diabetes History: 0 Stroke History: 0 Vascular Disease History: 0 Age Score: 2 Gender Score: 1     CrCl 50.8 ml/min  Per office protocol, patient can hold Xarelto for 1-2 days prior to procedure.

## 2020-11-24 NOTE — Telephone Encounter (Signed)
   [facility] Pre-operative Risk Assessment    Patient Name: ALMADELIA LOOMAN  DOB: Jun 11, 1940 MRN: [id number]  HEARTCARE STAFF:  - IMPORTANT!!!!!! Under Visit Info/Reason for Call, type in Other and utilize the format Clearance MM/DD/YY or Clearance TBD. Do not use dashes or single digits. - Please review there is not already an duplicate clearance open for this procedure. - If request is for dental extraction, please clarify the # of teeth to be extracted. - If the patient is currently at the dentist's office, call Pre-Op Callback Staff (MA/nurse) to input urgent request.  - If the patient is not currently in the dentist office, please route to the Pre-Op pool.  Request for surgical clearance:  What type of surgery is being performed? COLONOSCOPY  When is this surgery scheduled? 12/22/20  What type of clearance is required (medical clearance vs. Pharmacy clearance to hold med vs. Both)? BOTH  Are there any medications that need to be held prior to surgery and how long? [facility]  Practice name and name of physician performing surgery? [facility]; DR. HUNG  What is the office phone number? [phone]   7.   What is the office fax number? [phone]  8.   Anesthesia type (None, local, MAC, general) ? PROPOFOL   Julaine Hua 11/24/2020, 11:57 AM  _________________________________________________________________   (provider comments below)

## 2020-11-28 NOTE — Telephone Encounter (Signed)
   Primary Cardiologist: Cristopher Peru, MD  Chart reviewed as part of pre-operative protocol coverage. Given past medical history and time since last visit, based on ACC/AHA guidelines, Danielle Lucas would be at acceptable risk for the planned procedure without further cardiovascular testing.   Patient with diagnosis of afib on Xarelto for anticoagulation.     Procedure: colonoscopy Date of procedure: 12/22/20   CHA2DS2-VASc Score = 4   This indicates a 4.8% annual risk of stroke. The patient's score is based upon: CHF History: 0 HTN History: 1 Diabetes History: 0 Stroke History: 0 Vascular Disease History: 0 Age Score: 2 Gender Score: 1      CrCl 50.8 ml/min   Per office protocol, patient can hold Xarelto for 1-2 days prior to procedure.  I will route this recommendation to the requesting party via Epic fax function and remove from pre-op pool.  Please call with questions.  Danielle Lucas. Danielle Severin NP-C    11/28/2020, 9:01 AM [address] Office [phone] Fax [phone]

## 2020-12-12 NOTE — Telephone Encounter (Signed)
Our office received a duplicate clearance request today. Clearance was originally faxed to requesting office on 11/28/20 @ 9:01 am. I will re-fax clearance notes again to Dr. Benson Norway and his scheduler.

## 2020-12-14 LAB — CUP PACEART REMOTE DEVICE CHECK
Battery Remaining Percentage: 65 %
Brady Statistic RA Percent Paced: 43 %
Brady Statistic RV Percent Paced: 0 %
Date Time Interrogation Session: 20221019092605
Implantable Lead Implant Date: 20170420
Implantable Lead Implant Date: 20170420
Implantable Lead Location: 753859
Implantable Lead Location: 753860
Implantable Lead Model: 377
Implantable Lead Model: 377
Implantable Lead Serial Number: 49378160
Implantable Lead Serial Number: 49421706
Implantable Pulse Generator Implant Date: 20170420
Lead Channel Impedance Value: 449 Ohm
Lead Channel Impedance Value: 644 Ohm
Lead Channel Pacing Threshold Amplitude: 0.7 V
Lead Channel Pacing Threshold Amplitude: 0.8 V
Lead Channel Pacing Threshold Pulse Width: 0.4 ms
Lead Channel Pacing Threshold Pulse Width: 0.4 ms
Lead Channel Sensing Intrinsic Amplitude: 10.1 mV
Lead Channel Sensing Intrinsic Amplitude: 449 mV
Lead Channel Setting Pacing Amplitude: 2 V
Lead Channel Setting Pacing Amplitude: 2.4 V
Lead Channel Setting Pacing Pulse Width: 0.4 ms
Pulse Gen Model: 394969
Pulse Gen Serial Number: 68713503

## 2020-12-15 ENCOUNTER — Ambulatory Visit (INDEPENDENT_AMBULATORY_CARE_PROVIDER_SITE_OTHER): Payer: Medicare HMO

## 2020-12-15 DIAGNOSIS — I495 Sick sinus syndrome: Secondary | ICD-10-CM | POA: Diagnosis not present

## 2020-12-18 ENCOUNTER — Other Ambulatory Visit: Payer: Self-pay | Admitting: Internal Medicine

## 2020-12-19 NOTE — Telephone Encounter (Signed)
Xarelto 20 mg refill request received. Pt is 80 years old, weight-  64.2 kg, Crea- 0.8 on 05/12/20, last seen by Dr. Lovena Le on 06/01/20, Diagnosis-PAF, CrCl- 57.79; Dose is appropriate based on dosing criteria. Will send in refill to requested pharmacy.

## 2020-12-22 DIAGNOSIS — Z86010 Personal history of colonic polyps: Secondary | ICD-10-CM | POA: Diagnosis not present

## 2020-12-23 NOTE — Progress Notes (Signed)
Remote pacemaker transmission.   

## 2020-12-26 ENCOUNTER — Encounter (HOSPITAL_BASED_OUTPATIENT_CLINIC_OR_DEPARTMENT_OTHER): Payer: Self-pay | Admitting: *Deleted

## 2020-12-28 DIAGNOSIS — Z1231 Encounter for screening mammogram for malignant neoplasm of breast: Secondary | ICD-10-CM | POA: Diagnosis not present

## 2020-12-30 DIAGNOSIS — H10011 Acute follicular conjunctivitis, right eye: Secondary | ICD-10-CM | POA: Diagnosis not present

## 2020-12-30 DIAGNOSIS — J4520 Mild intermittent asthma, uncomplicated: Secondary | ICD-10-CM | POA: Diagnosis not present

## 2020-12-30 DIAGNOSIS — J3089 Other allergic rhinitis: Secondary | ICD-10-CM | POA: Diagnosis not present

## 2020-12-30 DIAGNOSIS — J301 Allergic rhinitis due to pollen: Secondary | ICD-10-CM | POA: Diagnosis not present

## 2020-12-30 DIAGNOSIS — J3081 Allergic rhinitis due to animal (cat) (dog) hair and dander: Secondary | ICD-10-CM | POA: Diagnosis not present

## 2021-01-04 DIAGNOSIS — J029 Acute pharyngitis, unspecified: Secondary | ICD-10-CM | POA: Diagnosis not present

## 2021-01-04 DIAGNOSIS — J069 Acute upper respiratory infection, unspecified: Secondary | ICD-10-CM | POA: Diagnosis not present

## 2021-01-04 DIAGNOSIS — R0981 Nasal congestion: Secondary | ICD-10-CM | POA: Diagnosis not present

## 2021-01-04 DIAGNOSIS — Z1152 Encounter for screening for COVID-19: Secondary | ICD-10-CM | POA: Diagnosis not present

## 2021-01-04 DIAGNOSIS — R5383 Other fatigue: Secondary | ICD-10-CM | POA: Diagnosis not present

## 2021-01-13 ENCOUNTER — Telehealth: Payer: Self-pay | Admitting: Internal Medicine

## 2021-01-13 MED ORDER — APIXABAN 5 MG PO TABS: 5.0000 mg | ORAL_TABLET | Freq: Two times a day (BID) | ORAL | 1 refills | Status: DC

## 2021-01-13 NOTE — Telephone Encounter (Signed)
**Note De-Identified Danielle Lucas Obfuscation** The pt switched to Xarelto in August due to cost of Eliquis after falling into her ins gap.  She wants to go back on Eliquis in 2023 because he ins resets and she can afford it again.  She is advised that I will s/w Dr Lovena Le and our pharmacist to be sure that her switching back to [place] is ok.  She requested that if this is ok for me to [facility] [place] to fill with a noteto pharmacist not to fill until 03/29/2021 as she has enough Xarelto to last until then.  She is aware that we will call her back if this switch in not ok and that if we do not call her back that we have made the change in her med list and will escribe her Eliquis RX to be filled on 03/29/2021.

## 2021-01-13 NOTE — Telephone Encounter (Signed)
**Note De-Identified Nealy Karapetian Obfuscation** I have removed Xarelto from the pts med list and added Eliquis 5 mg to be taken BID and then [facility] [place] [id number]ill with a note to the pharmacist "to replace Xarelto and Do not fill until 03/29/2021 per the pts request".  I called the pt and made her aware of change and advised her to take her 1st Eliquis 5mg  tablet 24 hours after her last dose of Xarelto and then to take 1 tablet twice daily there after.  She verbalized understanding and thanked me for my assistance.

## 2021-01-13 NOTE — Telephone Encounter (Signed)
Yes that is fine to change back to Eliquis. She will qualify for Eliquis 5mg  every 12 hours and should take her first dose 24 hours after her last dose of Xarelto.

## 2021-01-13 NOTE — Telephone Encounter (Signed)
Patient called wanting to know if she can go back on Eliquis the first part of the year, if that is possible.  She wants to go through her insurance because it's cheaper that way for the Eliquis, than the program for Xarelto, that she is on now.

## 2021-01-26 DIAGNOSIS — M810 Age-related osteoporosis without current pathological fracture: Secondary | ICD-10-CM | POA: Diagnosis not present

## 2021-03-14 DIAGNOSIS — E785 Hyperlipidemia, unspecified: Secondary | ICD-10-CM | POA: Diagnosis not present

## 2021-03-14 DIAGNOSIS — M810 Age-related osteoporosis without current pathological fracture: Secondary | ICD-10-CM | POA: Diagnosis not present

## 2021-03-15 LAB — CUP PACEART REMOTE DEVICE CHECK
Date Time Interrogation Session: 20230118110647
Implantable Lead Implant Date: 20170420
Implantable Lead Implant Date: 20170420
Implantable Lead Location: 753859
Implantable Lead Location: 753860
Implantable Lead Model: 377
Implantable Lead Model: 377
Implantable Lead Serial Number: 49378160
Implantable Lead Serial Number: 49421706
Implantable Pulse Generator Implant Date: 20170420
Pulse Gen Model: 394969
Pulse Gen Serial Number: 68713503

## 2021-03-16 ENCOUNTER — Ambulatory Visit (INDEPENDENT_AMBULATORY_CARE_PROVIDER_SITE_OTHER): Payer: Medicare HMO

## 2021-03-16 DIAGNOSIS — I495 Sick sinus syndrome: Secondary | ICD-10-CM

## 2021-03-29 NOTE — Progress Notes (Signed)
Remote pacemaker transmission.   

## 2021-04-04 DIAGNOSIS — J3081 Allergic rhinitis due to animal (cat) (dog) hair and dander: Secondary | ICD-10-CM | POA: Diagnosis not present

## 2021-04-04 DIAGNOSIS — J4520 Mild intermittent asthma, uncomplicated: Secondary | ICD-10-CM | POA: Diagnosis not present

## 2021-04-04 DIAGNOSIS — J301 Allergic rhinitis due to pollen: Secondary | ICD-10-CM | POA: Diagnosis not present

## 2021-04-04 DIAGNOSIS — J3089 Other allergic rhinitis: Secondary | ICD-10-CM | POA: Diagnosis not present

## 2021-04-12 ENCOUNTER — Other Ambulatory Visit (HOSPITAL_COMMUNITY): Payer: Self-pay | Admitting: *Deleted

## 2021-04-12 ENCOUNTER — Ambulatory Visit (HOSPITAL_COMMUNITY): Payer: Medicare HMO

## 2021-04-13 ENCOUNTER — Other Ambulatory Visit: Payer: Self-pay

## 2021-04-13 ENCOUNTER — Ambulatory Visit (HOSPITAL_COMMUNITY)
Admission: RE | Admit: 2021-04-13 | Discharge: 2021-04-13 | Disposition: A | Discharge status: Home / Self Care | Payer: Medicare HMO | Source: Ambulatory Visit | Attending: Internal Medicine | Admitting: Internal Medicine

## 2021-04-13 DIAGNOSIS — M810 Age-related osteoporosis without current pathological fracture: Secondary | ICD-10-CM | POA: Diagnosis not present

## 2021-04-13 MED ORDER — ZOLEDRONIC ACID 5 MG/100ML IV SOLN
INTRAVENOUS | Status: AC
  Administered 2021-04-13: 5 mg via INTRAVENOUS
  Filled 2021-04-13: qty 100

## 2021-04-13 MED ORDER — ZOLEDRONIC ACID 5 MG/100ML IV SOLN: 5.0000 mg | Freq: Once | INTRAVENOUS | Status: AC

## 2021-04-19 DIAGNOSIS — J3081 Allergic rhinitis due to animal (cat) (dog) hair and dander: Secondary | ICD-10-CM | POA: Diagnosis not present

## 2021-04-19 DIAGNOSIS — J3089 Other allergic rhinitis: Secondary | ICD-10-CM | POA: Diagnosis not present

## 2021-04-19 DIAGNOSIS — J301 Allergic rhinitis due to pollen: Secondary | ICD-10-CM | POA: Diagnosis not present

## 2021-05-02 DIAGNOSIS — J301 Allergic rhinitis due to pollen: Secondary | ICD-10-CM | POA: Diagnosis not present

## 2021-05-02 DIAGNOSIS — J3081 Allergic rhinitis due to animal (cat) (dog) hair and dander: Secondary | ICD-10-CM | POA: Diagnosis not present

## 2021-05-02 DIAGNOSIS — J3089 Other allergic rhinitis: Secondary | ICD-10-CM | POA: Diagnosis not present

## 2021-05-05 DIAGNOSIS — J301 Allergic rhinitis due to pollen: Secondary | ICD-10-CM | POA: Diagnosis not present

## 2021-05-05 DIAGNOSIS — J3081 Allergic rhinitis due to animal (cat) (dog) hair and dander: Secondary | ICD-10-CM | POA: Diagnosis not present

## 2021-05-05 DIAGNOSIS — J3089 Other allergic rhinitis: Secondary | ICD-10-CM | POA: Diagnosis not present

## 2021-05-08 DIAGNOSIS — J3089 Other allergic rhinitis: Secondary | ICD-10-CM | POA: Diagnosis not present

## 2021-05-08 DIAGNOSIS — J301 Allergic rhinitis due to pollen: Secondary | ICD-10-CM | POA: Diagnosis not present

## 2021-05-08 DIAGNOSIS — J3081 Allergic rhinitis due to animal (cat) (dog) hair and dander: Secondary | ICD-10-CM | POA: Diagnosis not present

## 2021-05-12 DIAGNOSIS — J301 Allergic rhinitis due to pollen: Secondary | ICD-10-CM | POA: Diagnosis not present

## 2021-05-12 DIAGNOSIS — J3089 Other allergic rhinitis: Secondary | ICD-10-CM | POA: Diagnosis not present

## 2021-05-16 DIAGNOSIS — J301 Allergic rhinitis due to pollen: Secondary | ICD-10-CM | POA: Diagnosis not present

## 2021-05-16 DIAGNOSIS — J3089 Other allergic rhinitis: Secondary | ICD-10-CM | POA: Diagnosis not present

## 2021-05-16 DIAGNOSIS — J3081 Allergic rhinitis due to animal (cat) (dog) hair and dander: Secondary | ICD-10-CM | POA: Diagnosis not present

## 2021-05-19 DIAGNOSIS — J3081 Allergic rhinitis due to animal (cat) (dog) hair and dander: Secondary | ICD-10-CM | POA: Diagnosis not present

## 2021-05-19 DIAGNOSIS — J3089 Other allergic rhinitis: Secondary | ICD-10-CM | POA: Diagnosis not present

## 2021-05-19 DIAGNOSIS — J301 Allergic rhinitis due to pollen: Secondary | ICD-10-CM | POA: Diagnosis not present

## 2021-05-26 DIAGNOSIS — J3081 Allergic rhinitis due to animal (cat) (dog) hair and dander: Secondary | ICD-10-CM | POA: Diagnosis not present

## 2021-05-26 DIAGNOSIS — J3089 Other allergic rhinitis: Secondary | ICD-10-CM | POA: Diagnosis not present

## 2021-05-26 DIAGNOSIS — J301 Allergic rhinitis due to pollen: Secondary | ICD-10-CM | POA: Diagnosis not present

## 2021-05-29 DIAGNOSIS — J3089 Other allergic rhinitis: Secondary | ICD-10-CM | POA: Diagnosis not present

## 2021-05-29 DIAGNOSIS — J3081 Allergic rhinitis due to animal (cat) (dog) hair and dander: Secondary | ICD-10-CM | POA: Diagnosis not present

## 2021-05-29 DIAGNOSIS — J301 Allergic rhinitis due to pollen: Secondary | ICD-10-CM | POA: Diagnosis not present

## 2021-05-31 DIAGNOSIS — J3081 Allergic rhinitis due to animal (cat) (dog) hair and dander: Secondary | ICD-10-CM | POA: Diagnosis not present

## 2021-05-31 DIAGNOSIS — J301 Allergic rhinitis due to pollen: Secondary | ICD-10-CM | POA: Diagnosis not present

## 2021-05-31 DIAGNOSIS — J3089 Other allergic rhinitis: Secondary | ICD-10-CM | POA: Diagnosis not present

## 2021-06-06 DIAGNOSIS — E785 Hyperlipidemia, unspecified: Secondary | ICD-10-CM | POA: Diagnosis not present

## 2021-06-06 DIAGNOSIS — M810 Age-related osteoporosis without current pathological fracture: Secondary | ICD-10-CM | POA: Diagnosis not present

## 2021-06-06 DIAGNOSIS — R7989 Other specified abnormal findings of blood chemistry: Secondary | ICD-10-CM | POA: Diagnosis not present

## 2021-06-06 DIAGNOSIS — R7301 Impaired fasting glucose: Secondary | ICD-10-CM | POA: Diagnosis not present

## 2021-06-07 DIAGNOSIS — J3089 Other allergic rhinitis: Secondary | ICD-10-CM | POA: Diagnosis not present

## 2021-06-07 DIAGNOSIS — J301 Allergic rhinitis due to pollen: Secondary | ICD-10-CM | POA: Diagnosis not present

## 2021-06-07 DIAGNOSIS — J3081 Allergic rhinitis due to animal (cat) (dog) hair and dander: Secondary | ICD-10-CM | POA: Diagnosis not present

## 2021-06-12 DIAGNOSIS — I495 Sick sinus syndrome: Secondary | ICD-10-CM | POA: Diagnosis not present

## 2021-06-12 DIAGNOSIS — Z809 Family history of malignant neoplasm, unspecified: Secondary | ICD-10-CM | POA: Diagnosis not present

## 2021-06-12 DIAGNOSIS — M1990 Unspecified osteoarthritis, unspecified site: Secondary | ICD-10-CM | POA: Diagnosis not present

## 2021-06-12 DIAGNOSIS — K219 Gastro-esophageal reflux disease without esophagitis: Secondary | ICD-10-CM | POA: Diagnosis not present

## 2021-06-12 DIAGNOSIS — E785 Hyperlipidemia, unspecified: Secondary | ICD-10-CM | POA: Diagnosis not present

## 2021-06-12 DIAGNOSIS — Z008 Encounter for other general examination: Secondary | ICD-10-CM | POA: Diagnosis not present

## 2021-06-12 DIAGNOSIS — R69 Illness, unspecified: Secondary | ICD-10-CM | POA: Diagnosis not present

## 2021-06-12 DIAGNOSIS — I10 Essential (primary) hypertension: Secondary | ICD-10-CM | POA: Diagnosis not present

## 2021-06-12 DIAGNOSIS — Z7901 Long term (current) use of anticoagulants: Secondary | ICD-10-CM | POA: Diagnosis not present

## 2021-06-12 DIAGNOSIS — J45909 Unspecified asthma, uncomplicated: Secondary | ICD-10-CM | POA: Diagnosis not present

## 2021-06-12 DIAGNOSIS — I471 Supraventricular tachycardia: Secondary | ICD-10-CM | POA: Diagnosis not present

## 2021-06-12 DIAGNOSIS — I4891 Unspecified atrial fibrillation: Secondary | ICD-10-CM | POA: Diagnosis not present

## 2021-06-13 DIAGNOSIS — H47022 Hemorrhage in optic nerve sheath, left eye: Secondary | ICD-10-CM | POA: Diagnosis not present

## 2021-06-13 DIAGNOSIS — H5212 Myopia, left eye: Secondary | ICD-10-CM | POA: Diagnosis not present

## 2021-06-13 DIAGNOSIS — H26493 Other secondary cataract, bilateral: Secondary | ICD-10-CM | POA: Diagnosis not present

## 2021-06-13 DIAGNOSIS — H524 Presbyopia: Secondary | ICD-10-CM | POA: Diagnosis not present

## 2021-06-14 DIAGNOSIS — J3081 Allergic rhinitis due to animal (cat) (dog) hair and dander: Secondary | ICD-10-CM | POA: Diagnosis not present

## 2021-06-14 DIAGNOSIS — J3089 Other allergic rhinitis: Secondary | ICD-10-CM | POA: Diagnosis not present

## 2021-06-14 DIAGNOSIS — J301 Allergic rhinitis due to pollen: Secondary | ICD-10-CM | POA: Diagnosis not present

## 2021-06-15 ENCOUNTER — Ambulatory Visit (INDEPENDENT_AMBULATORY_CARE_PROVIDER_SITE_OTHER): Payer: Medicare HMO

## 2021-06-15 DIAGNOSIS — M810 Age-related osteoporosis without current pathological fracture: Secondary | ICD-10-CM | POA: Diagnosis not present

## 2021-06-15 DIAGNOSIS — I495 Sick sinus syndrome: Secondary | ICD-10-CM | POA: Diagnosis not present

## 2021-06-15 DIAGNOSIS — Z1212 Encounter for screening for malignant neoplasm of rectum: Secondary | ICD-10-CM | POA: Diagnosis not present

## 2021-06-15 DIAGNOSIS — Z23 Encounter for immunization: Secondary | ICD-10-CM | POA: Diagnosis not present

## 2021-06-15 DIAGNOSIS — E785 Hyperlipidemia, unspecified: Secondary | ICD-10-CM | POA: Diagnosis not present

## 2021-06-15 DIAGNOSIS — Z950 Presence of cardiac pacemaker: Secondary | ICD-10-CM | POA: Diagnosis not present

## 2021-06-15 DIAGNOSIS — H9319 Tinnitus, unspecified ear: Secondary | ICD-10-CM | POA: Diagnosis not present

## 2021-06-15 DIAGNOSIS — D6869 Other thrombophilia: Secondary | ICD-10-CM | POA: Diagnosis not present

## 2021-06-15 DIAGNOSIS — Z0000 Encounter for general adult medical examination without abnormal findings: Secondary | ICD-10-CM | POA: Diagnosis not present

## 2021-06-15 DIAGNOSIS — I480 Paroxysmal atrial fibrillation: Secondary | ICD-10-CM | POA: Diagnosis not present

## 2021-06-15 DIAGNOSIS — R82998 Other abnormal findings in urine: Secondary | ICD-10-CM | POA: Diagnosis not present

## 2021-06-15 LAB — CUP PACEART REMOTE DEVICE CHECK
Battery Remaining Percentage: 60 %
Brady Statistic RA Percent Paced: 42 %
Brady Statistic RV Percent Paced: 0 %
Date Time Interrogation Session: 20230420073303
Implantable Lead Implant Date: 20170420
Implantable Lead Implant Date: 20170420
Implantable Lead Location: 753859
Implantable Lead Location: 753860
Implantable Lead Model: 377
Implantable Lead Model: 377
Implantable Lead Serial Number: 49378160
Implantable Lead Serial Number: 49421706
Implantable Pulse Generator Implant Date: 20170420
Lead Channel Impedance Value: 429 Ohm
Lead Channel Impedance Value: 644 Ohm
Lead Channel Pacing Threshold Amplitude: 0.8 V
Lead Channel Pacing Threshold Amplitude: 0.8 V
Lead Channel Pacing Threshold Pulse Width: 0.4 ms
Lead Channel Pacing Threshold Pulse Width: 0.4 ms
Lead Channel Sensing Intrinsic Amplitude: 10.4 mV
Lead Channel Sensing Intrinsic Amplitude: 3.2 mV
Lead Channel Setting Pacing Amplitude: 2 V
Lead Channel Setting Pacing Amplitude: 2.4 V
Lead Channel Setting Pacing Pulse Width: 0.4 ms
Pulse Gen Model: 394969
Pulse Gen Serial Number: 68713503

## 2021-06-20 DIAGNOSIS — J3081 Allergic rhinitis due to animal (cat) (dog) hair and dander: Secondary | ICD-10-CM | POA: Diagnosis not present

## 2021-06-20 DIAGNOSIS — J3089 Other allergic rhinitis: Secondary | ICD-10-CM | POA: Diagnosis not present

## 2021-06-20 DIAGNOSIS — J301 Allergic rhinitis due to pollen: Secondary | ICD-10-CM | POA: Diagnosis not present

## 2021-06-22 DIAGNOSIS — J301 Allergic rhinitis due to pollen: Secondary | ICD-10-CM | POA: Diagnosis not present

## 2021-06-22 DIAGNOSIS — J3089 Other allergic rhinitis: Secondary | ICD-10-CM | POA: Diagnosis not present

## 2021-06-22 DIAGNOSIS — J3081 Allergic rhinitis due to animal (cat) (dog) hair and dander: Secondary | ICD-10-CM | POA: Diagnosis not present

## 2021-06-23 ENCOUNTER — Telehealth: Payer: Self-pay | Admitting: Pharmacist

## 2021-06-23 NOTE — Telephone Encounter (Signed)
[employer] [facility] is currently conducting an anticoagulation clinical trial for atrial fibrillation. The trial is sponsored by [employer] and is called Atrial Fibrillation Stroke & Embolism Prevention Trial (OCEANIC-AF) The trial is comparing the new Factor Pollyann Samples inhibitor Cecille Rubin) to Eliquis and assessing outcomes with bleeding and stroke risk (similar to ARISTOTLE and ROCKET AF, but their direct comparator was warfarin).  We are doing the study with [facility] and Dr Adrian Prows, MD, [facility] is the principal investigator. Ms. Dundon is interested in participating in the trial. Please let me know your thoughts on this. ?

## 2021-06-27 DIAGNOSIS — J301 Allergic rhinitis due to pollen: Secondary | ICD-10-CM | POA: Diagnosis not present

## 2021-06-27 DIAGNOSIS — J3089 Other allergic rhinitis: Secondary | ICD-10-CM | POA: Diagnosis not present

## 2021-06-27 DIAGNOSIS — J3081 Allergic rhinitis due to animal (cat) (dog) hair and dander: Secondary | ICD-10-CM | POA: Diagnosis not present

## 2021-06-28 DIAGNOSIS — H26492 Other secondary cataract, left eye: Secondary | ICD-10-CM | POA: Diagnosis not present

## 2021-06-30 DIAGNOSIS — J3081 Allergic rhinitis due to animal (cat) (dog) hair and dander: Secondary | ICD-10-CM | POA: Diagnosis not present

## 2021-06-30 DIAGNOSIS — J3089 Other allergic rhinitis: Secondary | ICD-10-CM | POA: Diagnosis not present

## 2021-06-30 DIAGNOSIS — J301 Allergic rhinitis due to pollen: Secondary | ICD-10-CM | POA: Diagnosis not present

## 2021-07-03 DIAGNOSIS — J3089 Other allergic rhinitis: Secondary | ICD-10-CM | POA: Diagnosis not present

## 2021-07-03 DIAGNOSIS — J301 Allergic rhinitis due to pollen: Secondary | ICD-10-CM | POA: Diagnosis not present

## 2021-07-03 DIAGNOSIS — J3081 Allergic rhinitis due to animal (cat) (dog) hair and dander: Secondary | ICD-10-CM | POA: Diagnosis not present

## 2021-07-03 NOTE — Progress Notes (Signed)
Remote pacemaker transmission.   

## 2021-07-05 DIAGNOSIS — H26491 Other secondary cataract, right eye: Secondary | ICD-10-CM | POA: Diagnosis not present

## 2021-07-05 DIAGNOSIS — J301 Allergic rhinitis due to pollen: Secondary | ICD-10-CM | POA: Diagnosis not present

## 2021-07-05 DIAGNOSIS — J3081 Allergic rhinitis due to animal (cat) (dog) hair and dander: Secondary | ICD-10-CM | POA: Diagnosis not present

## 2021-07-05 DIAGNOSIS — J3089 Other allergic rhinitis: Secondary | ICD-10-CM | POA: Diagnosis not present

## 2021-07-10 DIAGNOSIS — J3089 Other allergic rhinitis: Secondary | ICD-10-CM | POA: Diagnosis not present

## 2021-07-10 DIAGNOSIS — J3081 Allergic rhinitis due to animal (cat) (dog) hair and dander: Secondary | ICD-10-CM | POA: Diagnosis not present

## 2021-07-10 DIAGNOSIS — J301 Allergic rhinitis due to pollen: Secondary | ICD-10-CM | POA: Diagnosis not present

## 2021-07-10 NOTE — Telephone Encounter (Signed)
Ok to participate.  ?

## 2021-07-12 DIAGNOSIS — H0011 Chalazion right upper eyelid: Secondary | ICD-10-CM | POA: Diagnosis not present

## 2021-07-14 DIAGNOSIS — J3081 Allergic rhinitis due to animal (cat) (dog) hair and dander: Secondary | ICD-10-CM | POA: Diagnosis not present

## 2021-07-14 DIAGNOSIS — J301 Allergic rhinitis due to pollen: Secondary | ICD-10-CM | POA: Diagnosis not present

## 2021-07-14 DIAGNOSIS — J3089 Other allergic rhinitis: Secondary | ICD-10-CM | POA: Diagnosis not present

## 2021-07-20 DIAGNOSIS — J301 Allergic rhinitis due to pollen: Secondary | ICD-10-CM | POA: Diagnosis not present

## 2021-07-20 DIAGNOSIS — J3081 Allergic rhinitis due to animal (cat) (dog) hair and dander: Secondary | ICD-10-CM | POA: Diagnosis not present

## 2021-07-20 DIAGNOSIS — J3089 Other allergic rhinitis: Secondary | ICD-10-CM | POA: Diagnosis not present

## 2021-07-26 DIAGNOSIS — J301 Allergic rhinitis due to pollen: Secondary | ICD-10-CM | POA: Diagnosis not present

## 2021-07-26 DIAGNOSIS — J3089 Other allergic rhinitis: Secondary | ICD-10-CM | POA: Diagnosis not present

## 2021-07-26 DIAGNOSIS — J3081 Allergic rhinitis due to animal (cat) (dog) hair and dander: Secondary | ICD-10-CM | POA: Diagnosis not present

## 2021-08-02 DIAGNOSIS — J301 Allergic rhinitis due to pollen: Secondary | ICD-10-CM | POA: Diagnosis not present

## 2021-08-02 DIAGNOSIS — J3089 Other allergic rhinitis: Secondary | ICD-10-CM | POA: Diagnosis not present

## 2021-08-02 DIAGNOSIS — J3081 Allergic rhinitis due to animal (cat) (dog) hair and dander: Secondary | ICD-10-CM | POA: Diagnosis not present

## 2021-08-09 DIAGNOSIS — J301 Allergic rhinitis due to pollen: Secondary | ICD-10-CM | POA: Diagnosis not present

## 2021-08-09 DIAGNOSIS — J3081 Allergic rhinitis due to animal (cat) (dog) hair and dander: Secondary | ICD-10-CM | POA: Diagnosis not present

## 2021-08-09 DIAGNOSIS — J3089 Other allergic rhinitis: Secondary | ICD-10-CM | POA: Diagnosis not present

## 2021-08-16 ENCOUNTER — Encounter: Payer: Self-pay | Admitting: Internal Medicine

## 2021-08-16 ENCOUNTER — Ambulatory Visit (INDEPENDENT_AMBULATORY_CARE_PROVIDER_SITE_OTHER): Payer: Medicare HMO | Admitting: Internal Medicine

## 2021-08-16 VITALS — BP 110/72 | HR 69 | Wt 142.8 lb

## 2021-08-16 DIAGNOSIS — J3081 Allergic rhinitis due to animal (cat) (dog) hair and dander: Secondary | ICD-10-CM | POA: Diagnosis not present

## 2021-08-16 DIAGNOSIS — I495 Sick sinus syndrome: Secondary | ICD-10-CM | POA: Diagnosis not present

## 2021-08-16 DIAGNOSIS — I4891 Unspecified atrial fibrillation: Secondary | ICD-10-CM | POA: Diagnosis not present

## 2021-08-16 DIAGNOSIS — J301 Allergic rhinitis due to pollen: Secondary | ICD-10-CM | POA: Diagnosis not present

## 2021-08-16 DIAGNOSIS — J3089 Other allergic rhinitis: Secondary | ICD-10-CM | POA: Diagnosis not present

## 2021-08-16 NOTE — Patient Instructions (Signed)
Medication Instructions:  Your physician recommends that you continue on your current medications as directed. Please refer to the Current Medication list given to you today.  Labwork: None ordered.  Testing/Procedures: None ordered.  Follow-Up: Your physician wants you to follow-up in: one year with Cristopher Peru, MD or one of the following Advanced Practice Providers on your designated Care Team:   Tommye Standard, [place] Legrand Como "Jonni Sanger" Chalmers Cater, [place]  Remote monitoring is used to monitor your Pacemaker from home. This monitoring reduces the number of office visits required to check your device to one time per year. It allows [place] to keep an eye on the functioning of your device to ensure it is working properly. You are scheduled for a device check from home on 09/14/2021. You may send your transmission at any time that day. If you have a wireless device, the transmission will be sent automatically. After your physician reviews your transmission, you will receive a postcard with your next transmission date.  Any Other Special Instructions Will Be Listed Below (If Applicable).  If you need a refill on your cardiac medications before your next appointment, please call your pharmacy.   Important Information About Sugar

## 2021-08-16 NOTE — Progress Notes (Signed)
HPI Danielle Lucas returns today for followup of her atrial arrhythmias. She is a pleasant 81 yo woman with a h/o CHB, s/p PPM insertion, PAF,HTN, and NSVT. In the interim, she notes that she has not had more arrhythmia and denies chest pain or sob. She has started anti-coagulation with eliquis. She has not had any bleeding problems Allergies  Allergen Reactions   Other Anaphylaxis    Cat dander   Codeine Nausea And Vomiting   Dilaudid [Hydromorphone Hcl] Nausea And Vomiting   Morphine And Related Nausea And Vomiting     Current Outpatient Medications  Medication Sig Dispense Refill   apixaban (ELIQUIS) 5 MG TABS tablet Take 1 tablet (5 mg total) by mouth 2 (two) times daily. 180 tablet 1   B Complex Vitamins (VITAMIN-B COMPLEX PO) Take 1 capsule by mouth daily.      Calcium-Vitamin D-Vitamin K [id number] MG-UNT-MCG CHEW Chew 1 capsule by mouth 2 (two) times daily.      Cholecalciferol (VITAMIN D) 2000 UNITS CAPS Take 2,000 Units by mouth daily.      Coenzyme Q10 (COQ10 PO) Take by mouth.     EPIPEN 2-PAK 0.3 MG/0.3ML SOAJ injection Inject 0.3 mg into the muscle as directed. Reported on 04/12/2015     escitalopram (LEXAPRO) 10 MG tablet Take 5 mg by mouth daily. Takes 1 tablet twice weekly     ipratropium (ATROVENT) 0.06 % nasal spray Place 1 spray into both nostrils 4 (four) times daily as needed for rhinitis.     metoprolol succinate (TOPROL-XL) 25 MG 24 hr tablet Take 1 tablet (25 mg total) by mouth daily. 90 tablet 3   OVER THE COUNTER MEDICATION Liquid tumeric 15 mg daily     pravastatin (PRAVACHOL) 20 MG tablet Take 1 tablet (20 mg total) by mouth every evening. Take four times weekly.     TURMERIC PO Take 15 mLs by mouth daily.      valsartan (DIOVAN) 80 MG tablet Take 80 mg by mouth daily.     VENTOLIN HFA 108 (90 BASE) MCG/ACT inhaler Inhale 1-2 puffs into the lungs daily as needed for wheezing or shortness of breath.      Zoledronic Acid (RECLAST IV) Inject into the  vein See admin instructions. Uses every other year. Unknown Dose     No current facility-administered medications for this visit.     Past Medical History:  Diagnosis Date   Abnormal Pap smear of cervix    Acute respiratory failure with hypoxia (HCC)    Asthma    "I'll have an attack if I'm around cat or dust"   [facility])    Atrial fibrillation ([place])    CHB (complete heart block) ([place]) 05/2015   s/p Biotronik (serial number [id number]) dual lead pacemaker   Depression    Exercise-induced asthma    Hx of colonic polyps    Hypertension    Osteopenia    PONV (postoperative nausea and vomiting)    pt. thinks it was from dilaudid   Presence of permanent cardiac pacemaker     ROS:   All systems reviewed and negative except as noted in the HPI.   Past Surgical History:  Procedure Laterality Date   ABDOMINAL HYSTERECTOMY N/A 07/28/2013   Procedure: HYSTERECTOMY ABDOMINAL with SALPINGO OOPHORECTOMY;  Surgeon: Lyman Speller, MD;  Location: [facility];  Service: Gynecology;  Laterality: N/A;  request 4.5 hours   ABDOMINAL SACROCOLPOPEXY N/A 07/28/2013   Procedure: ABDOMINO SACROCOLPOPEXY;  Surgeon:  Brook E Amundson de Berton Lan, MD;  Location: [facility];  Service: Gynecology;  Laterality: N/A;   APPENDECTOMY     BLADDER SUSPENSION N/A 07/28/2013   Procedure: TRANSVAGINAL TAPE (TVT) PROCEDURE midurethral sling;  Surgeon: Everardo All Amundson de Berton Lan, MD;  Location: [facility];  Service: Gynecology;  Laterality: N/A;   CHOLECYSTECTOMY OPEN     CRYOTHERAPY     for abnormal pap smear yrs ago   CYSTOSCOPY N/A 07/28/2013   Procedure: CYSTOSCOPY;  Surgeon: Jamey Reas de Berton Lan, MD;  Location: [facility];  Service: Gynecology;  Laterality: N/A;   EP IMPLANTABLE DEVICE N/A 06/16/2015   Procedure: Pacemaker Implant;  Surgeon: Evans Lance, MD; Biotronik (serial number [id number]) pacemaker; Laterality: Left   EP IMPLANTABLE DEVICE N/A 06/16/2015   Procedure: Loop Recorder  Removal;  Surgeon: Evans Lance, MD;  Location: [facility];  Service: Cardiovascular;  Laterality: N/A;   FRACTURE SURGERY     INSERT / REPLACE / REMOVE PACEMAKER  06/16/2015   LOOP RECORDER IMPLANT N/A 10/21/2013   Procedure: LOOP RECORDER IMPLANT;  Surgeon: Evans Lance, MD;  Location: [facility];  Service: Cardiovascular;  Laterality: N/A;   LOOP RECORDER REMOVAL  06/16/2015   TEMPORARY PACEMAKER INSERTION N/A 07/29/2013   Procedure: TEMPORARY PACEMAKER INSERTION;  Surgeon: Wellington Hampshire, MD;  Location: [facility];  Service: Cardiovascular;  Laterality: N/A;   TONSILLECTOMY  1940s   TOTAL ABDOMINAL HYSTERECTOMY W/ BILATERAL SALPINGOOPHORECTOMY  2015   Abdominal sacrocolpopexy, tension-free vaginal tape Exact, cystoscopy    TUBAL LIGATION     WRIST FRACTURE SURGERY Left    plate inserted     Family History  Problem Relation Age of Onset   Varicose Veins Mother    Heart attack Father    Cancer Brother        colon cancer (polyps)     Social History   Socioeconomic History   Marital status: Divorced    Spouse name: Not on file   Number of children: Not on file   Years of education: Not on file   Highest education level: Not on file  Occupational History   Not on file  Tobacco Use   Smoking status: Former    Packs/day: 1.00    Years: 30.00    Total pack years: 30.00    Types: Cigarettes    Quit date: 07/02/1988    Years since quitting: 33.1   Smokeless tobacco: Never  Vaping Use   Vaping Use: Never used  Substance and Sexual Activity   Alcohol use: Yes    Alcohol/week: 0.0 - 1.0 standard drinks of alcohol   Drug use: No   Sexual activity: Not Currently    Partners: Male    Birth control/protection: Post-menopausal, Surgical    Comment: TAH/BSO  Other Topics Concern   Not on file  Social History Narrative   Not on file   Social Determinants of Health   Financial Resource Strain: Not on file  Food Insecurity: Not on file  Transportation Needs:  Not on file  Physical Activity: Not on file  Stress: Not on file  Social Connections: Not on file  Intimate Partner Violence: Not on file     BP 110/72   Pulse 69   Wt 142 lb 12.8 oz (64.8 kg)   LMP 02/27/2000   SpO2 95%   BMI 25.70 kg/m   Physical Exam:  Well appearing NAD HEENT: Unremarkable Neck:  No JVD, no thyromegally Lymphatics:  No adenopathy Back:  No CVA tenderness Lungs:  Clear HEART:  Regular rate rhythm, no murmurs, no rubs, no clicks Abd:  soft, positive bowel sounds, no organomegally, no rebound, no guarding Ext:  2 plus pulses, no edema, no cyanosis, no clubbing Skin:  No rashes no nodules Neuro:  CN II through XII intact, motor grossly intact  EKG  DEVICE  Normal device function.  See PaceArt for details.   Assess/Plan:  1. PAF - she is maintaining NSR. No change in her meds. Her last episode of atrial fib was over a year ago and lasted 6 hours. 2. HTN - her bp is well controlled. 3. Sinus node dysfunction - she is atrial pacing about half of the time. 4. PPM - her Biotronik DDD PM is working normally.   Danielle Overlie Tonjia Parillo,MD

## 2021-08-17 DIAGNOSIS — J301 Allergic rhinitis due to pollen: Secondary | ICD-10-CM | POA: Diagnosis not present

## 2021-08-17 DIAGNOSIS — J3089 Other allergic rhinitis: Secondary | ICD-10-CM | POA: Diagnosis not present

## 2021-08-17 DIAGNOSIS — J3081 Allergic rhinitis due to animal (cat) (dog) hair and dander: Secondary | ICD-10-CM | POA: Diagnosis not present

## 2021-08-23 DIAGNOSIS — J301 Allergic rhinitis due to pollen: Secondary | ICD-10-CM | POA: Diagnosis not present

## 2021-08-23 DIAGNOSIS — J3089 Other allergic rhinitis: Secondary | ICD-10-CM | POA: Diagnosis not present

## 2021-08-23 DIAGNOSIS — J3081 Allergic rhinitis due to animal (cat) (dog) hair and dander: Secondary | ICD-10-CM | POA: Diagnosis not present

## 2021-08-30 DIAGNOSIS — J3089 Other allergic rhinitis: Secondary | ICD-10-CM | POA: Diagnosis not present

## 2021-08-30 DIAGNOSIS — J3081 Allergic rhinitis due to animal (cat) (dog) hair and dander: Secondary | ICD-10-CM | POA: Diagnosis not present

## 2021-08-30 DIAGNOSIS — J301 Allergic rhinitis due to pollen: Secondary | ICD-10-CM | POA: Diagnosis not present

## 2021-09-06 DIAGNOSIS — J3089 Other allergic rhinitis: Secondary | ICD-10-CM | POA: Diagnosis not present

## 2021-09-06 DIAGNOSIS — J3081 Allergic rhinitis due to animal (cat) (dog) hair and dander: Secondary | ICD-10-CM | POA: Diagnosis not present

## 2021-09-06 DIAGNOSIS — J301 Allergic rhinitis due to pollen: Secondary | ICD-10-CM | POA: Diagnosis not present

## 2021-09-11 DIAGNOSIS — J301 Allergic rhinitis due to pollen: Secondary | ICD-10-CM | POA: Diagnosis not present

## 2021-09-11 DIAGNOSIS — J3089 Other allergic rhinitis: Secondary | ICD-10-CM | POA: Diagnosis not present

## 2021-09-11 DIAGNOSIS — J3081 Allergic rhinitis due to animal (cat) (dog) hair and dander: Secondary | ICD-10-CM | POA: Diagnosis not present

## 2021-09-14 ENCOUNTER — Ambulatory Visit (INDEPENDENT_AMBULATORY_CARE_PROVIDER_SITE_OTHER): Payer: Medicare HMO

## 2021-09-14 DIAGNOSIS — I495 Sick sinus syndrome: Secondary | ICD-10-CM

## 2021-09-14 LAB — CUP PACEART REMOTE DEVICE CHECK
Battery Remaining Percentage: 60 %
Brady Statistic RA Percent Paced: 42 %
Brady Statistic RV Percent Paced: 0 %
Date Time Interrogation Session: 20230719075459
Implantable Lead Implant Date: 20170420
Implantable Lead Implant Date: 20170420
Implantable Lead Location: 753859
Implantable Lead Location: 753860
Implantable Lead Model: 377
Implantable Lead Model: 377
Implantable Lead Serial Number: 49378160
Implantable Lead Serial Number: 49421706
Implantable Pulse Generator Implant Date: 20170420
Lead Channel Impedance Value: 449 Ohm
Lead Channel Impedance Value: 663 Ohm
Lead Channel Pacing Threshold Amplitude: 0.8 V
Lead Channel Pacing Threshold Pulse Width: 0.4 ms
Lead Channel Sensing Intrinsic Amplitude: 10.1 mV
Lead Channel Sensing Intrinsic Amplitude: 3.1 mV
Lead Channel Setting Pacing Amplitude: 2 V
Lead Channel Setting Pacing Amplitude: 2.4 V
Lead Channel Setting Pacing Pulse Width: 0.4 ms
Pulse Gen Model: 394969
Pulse Gen Serial Number: 68713503

## 2021-09-20 DIAGNOSIS — J3081 Allergic rhinitis due to animal (cat) (dog) hair and dander: Secondary | ICD-10-CM | POA: Diagnosis not present

## 2021-09-20 DIAGNOSIS — J301 Allergic rhinitis due to pollen: Secondary | ICD-10-CM | POA: Diagnosis not present

## 2021-09-20 DIAGNOSIS — J3089 Other allergic rhinitis: Secondary | ICD-10-CM | POA: Diagnosis not present

## 2021-09-25 DIAGNOSIS — J3081 Allergic rhinitis due to animal (cat) (dog) hair and dander: Secondary | ICD-10-CM | POA: Diagnosis not present

## 2021-09-25 DIAGNOSIS — J301 Allergic rhinitis due to pollen: Secondary | ICD-10-CM | POA: Diagnosis not present

## 2021-09-25 DIAGNOSIS — J3089 Other allergic rhinitis: Secondary | ICD-10-CM | POA: Diagnosis not present

## 2021-10-02 DIAGNOSIS — J301 Allergic rhinitis due to pollen: Secondary | ICD-10-CM | POA: Diagnosis not present

## 2021-10-02 DIAGNOSIS — J3081 Allergic rhinitis due to animal (cat) (dog) hair and dander: Secondary | ICD-10-CM | POA: Diagnosis not present

## 2021-10-02 DIAGNOSIS — J3089 Other allergic rhinitis: Secondary | ICD-10-CM | POA: Diagnosis not present

## 2021-10-03 ENCOUNTER — Ambulatory Visit: Payer: Medicare HMO | Admitting: Internal Medicine

## 2021-10-06 NOTE — Progress Notes (Signed)
Remote pacemaker transmission.   

## 2021-10-16 DIAGNOSIS — J301 Allergic rhinitis due to pollen: Secondary | ICD-10-CM | POA: Diagnosis not present

## 2021-10-16 DIAGNOSIS — J3089 Other allergic rhinitis: Secondary | ICD-10-CM | POA: Diagnosis not present

## 2021-10-16 DIAGNOSIS — J3081 Allergic rhinitis due to animal (cat) (dog) hair and dander: Secondary | ICD-10-CM | POA: Diagnosis not present

## 2021-11-01 DIAGNOSIS — J301 Allergic rhinitis due to pollen: Secondary | ICD-10-CM | POA: Diagnosis not present

## 2021-11-01 DIAGNOSIS — J3081 Allergic rhinitis due to animal (cat) (dog) hair and dander: Secondary | ICD-10-CM | POA: Diagnosis not present

## 2021-11-06 ENCOUNTER — Other Ambulatory Visit: Payer: Self-pay | Admitting: Internal Medicine

## 2021-11-07 DIAGNOSIS — J3081 Allergic rhinitis due to animal (cat) (dog) hair and dander: Secondary | ICD-10-CM | POA: Diagnosis not present

## 2021-11-07 DIAGNOSIS — J3089 Other allergic rhinitis: Secondary | ICD-10-CM | POA: Diagnosis not present

## 2021-11-07 DIAGNOSIS — J301 Allergic rhinitis due to pollen: Secondary | ICD-10-CM | POA: Diagnosis not present

## 2021-11-14 DIAGNOSIS — J3081 Allergic rhinitis due to animal (cat) (dog) hair and dander: Secondary | ICD-10-CM | POA: Diagnosis not present

## 2021-11-14 DIAGNOSIS — J3089 Other allergic rhinitis: Secondary | ICD-10-CM | POA: Diagnosis not present

## 2021-11-14 DIAGNOSIS — J301 Allergic rhinitis due to pollen: Secondary | ICD-10-CM | POA: Diagnosis not present

## 2021-11-14 DIAGNOSIS — J4520 Mild intermittent asthma, uncomplicated: Secondary | ICD-10-CM | POA: Diagnosis not present

## 2021-11-14 DIAGNOSIS — R051 Acute cough: Secondary | ICD-10-CM | POA: Diagnosis not present

## 2021-12-05 DIAGNOSIS — J301 Allergic rhinitis due to pollen: Secondary | ICD-10-CM | POA: Diagnosis not present

## 2021-12-05 DIAGNOSIS — J3081 Allergic rhinitis due to animal (cat) (dog) hair and dander: Secondary | ICD-10-CM | POA: Diagnosis not present

## 2021-12-05 DIAGNOSIS — J3089 Other allergic rhinitis: Secondary | ICD-10-CM | POA: Diagnosis not present

## 2021-12-13 LAB — CUP PACEART REMOTE DEVICE CHECK
Battery Remaining Percentage: 60 %
Brady Statistic RA Percent Paced: 43 %
Brady Statistic RV Percent Paced: 0 %
Date Time Interrogation Session: 20231013104606
Implantable Lead Implant Date: 20170420
Implantable Lead Implant Date: 20170420
Implantable Lead Location: 753859
Implantable Lead Location: 753860
Implantable Lead Model: 377
Implantable Lead Model: 377
Implantable Lead Serial Number: 49378160
Implantable Lead Serial Number: 49421706
Implantable Pulse Generator Implant Date: 20170420
Lead Channel Impedance Value: 449 Ohm
Lead Channel Impedance Value: 644 Ohm
Lead Channel Pacing Threshold Amplitude: 0.7 V
Lead Channel Pacing Threshold Amplitude: 0.8 V
Lead Channel Pacing Threshold Pulse Width: 0.4 ms
Lead Channel Pacing Threshold Pulse Width: 0.4 ms
Lead Channel Sensing Intrinsic Amplitude: 2.8 mV
Lead Channel Sensing Intrinsic Amplitude: 9.9 mV
Lead Channel Setting Pacing Amplitude: 2 V
Lead Channel Setting Pacing Amplitude: 2.4 V
Lead Channel Setting Pacing Pulse Width: 0.4 ms
Pulse Gen Model: 394969
Pulse Gen Serial Number: 68713503

## 2021-12-14 ENCOUNTER — Ambulatory Visit (INDEPENDENT_AMBULATORY_CARE_PROVIDER_SITE_OTHER): Payer: Medicare HMO

## 2021-12-14 DIAGNOSIS — I495 Sick sinus syndrome: Secondary | ICD-10-CM

## 2021-12-20 DIAGNOSIS — J3089 Other allergic rhinitis: Secondary | ICD-10-CM | POA: Diagnosis not present

## 2021-12-20 DIAGNOSIS — J301 Allergic rhinitis due to pollen: Secondary | ICD-10-CM | POA: Diagnosis not present

## 2021-12-20 DIAGNOSIS — J3081 Allergic rhinitis due to animal (cat) (dog) hair and dander: Secondary | ICD-10-CM | POA: Diagnosis not present

## 2021-12-26 NOTE — Progress Notes (Signed)
Remote pacemaker transmission.   

## 2021-12-28 DIAGNOSIS — J3081 Allergic rhinitis due to animal (cat) (dog) hair and dander: Secondary | ICD-10-CM | POA: Diagnosis not present

## 2021-12-28 DIAGNOSIS — J301 Allergic rhinitis due to pollen: Secondary | ICD-10-CM | POA: Diagnosis not present

## 2021-12-28 DIAGNOSIS — J3089 Other allergic rhinitis: Secondary | ICD-10-CM | POA: Diagnosis not present

## 2022-01-01 ENCOUNTER — Telehealth: Payer: Self-pay | Admitting: Internal Medicine

## 2022-01-01 NOTE — Telephone Encounter (Signed)
Pt c/o medication issue:  1. Name of Medication:   ELIQUIS 5 MG TABS tablet    2. How are you currently taking this medication (dosage and times per day)?   TAKE 1 TABLET BY MOUTH TWICE A DAY    3. Are you having a reaction (difficulty breathing--STAT)? no  4. What is your medication issue? Patient is in the donut whole with insurance and unable to afford medication. Calling to see what her optiosn are. Please advise

## 2022-01-01 NOTE — Telephone Encounter (Signed)
Ok to switch to xarelto 20 mg daily.

## 2022-01-01 NOTE — Telephone Encounter (Signed)
Per pt is in donut hole and can take Xarelto and will cost about $65.00 verses about $600.00 a month for Eliquis Will forward to Dr Lovena Le for review Per pt this was done last year due to cost ./cy

## 2022-01-03 DIAGNOSIS — Z1231 Encounter for screening mammogram for malignant neoplasm of breast: Secondary | ICD-10-CM | POA: Diagnosis not present

## 2022-01-03 MED ORDER — RIVAROXABAN 20 MG PO TABS: 20.0000 mg | ORAL_TABLET | Freq: Every day | ORAL | 5 refills | Status: DC

## 2022-01-03 NOTE — Telephone Encounter (Signed)
Pt called back, and discussed changing her anti-coagulation to Xarelto, and discontinuing Eliquis.  Pt stated had financial concerns on Eliquis.    Xarelto order submitted to pharmacy, and Eliquis discontinued on Pt MAR.   Pt understood medication education.

## 2022-01-03 NOTE — Telephone Encounter (Signed)
Transfer to [employer]

## 2022-01-03 NOTE — Addendum Note (Signed)
Addended by: Oleta Mouse C on: 01/03/2022 10:22 AM   Modules accepted: Orders

## 2022-01-03 NOTE — Telephone Encounter (Signed)
Unable to speak with patient, voicemail message left X2, to discuss switch to Xarelto.  Follow up still required.

## 2022-01-04 DIAGNOSIS — J3081 Allergic rhinitis due to animal (cat) (dog) hair and dander: Secondary | ICD-10-CM | POA: Diagnosis not present

## 2022-01-04 DIAGNOSIS — J301 Allergic rhinitis due to pollen: Secondary | ICD-10-CM | POA: Diagnosis not present

## 2022-01-04 DIAGNOSIS — J3089 Other allergic rhinitis: Secondary | ICD-10-CM | POA: Diagnosis not present

## 2022-01-08 ENCOUNTER — Encounter (HOSPITAL_BASED_OUTPATIENT_CLINIC_OR_DEPARTMENT_OTHER): Payer: Self-pay | Admitting: *Deleted

## 2022-01-09 ENCOUNTER — Other Ambulatory Visit: Payer: Self-pay

## 2022-01-09 DIAGNOSIS — H40013 Open angle with borderline findings, low risk, bilateral: Secondary | ICD-10-CM | POA: Diagnosis not present

## 2022-01-09 MED ORDER — RIVAROXABAN 20 MG PO TABS: 20.0000 mg | ORAL_TABLET | Freq: Every day | ORAL | 1 refills | Status: DC

## 2022-01-09 NOTE — Telephone Encounter (Signed)
Prescription refill request for Xarelto received.  Indication:afib Last office visit:6/23 Weight:64.8 kg Age:81 Scr:0.8 CrCl:57.38  ml/min  Prescription refilled

## 2022-01-11 DIAGNOSIS — J301 Allergic rhinitis due to pollen: Secondary | ICD-10-CM | POA: Diagnosis not present

## 2022-01-11 DIAGNOSIS — J3081 Allergic rhinitis due to animal (cat) (dog) hair and dander: Secondary | ICD-10-CM | POA: Diagnosis not present

## 2022-01-11 DIAGNOSIS — J3089 Other allergic rhinitis: Secondary | ICD-10-CM | POA: Diagnosis not present

## 2022-01-17 DIAGNOSIS — J3089 Other allergic rhinitis: Secondary | ICD-10-CM | POA: Diagnosis not present

## 2022-01-17 DIAGNOSIS — J301 Allergic rhinitis due to pollen: Secondary | ICD-10-CM | POA: Diagnosis not present

## 2022-01-17 DIAGNOSIS — J3081 Allergic rhinitis due to animal (cat) (dog) hair and dander: Secondary | ICD-10-CM | POA: Diagnosis not present

## 2022-01-19 DIAGNOSIS — R21 Rash and other nonspecific skin eruption: Secondary | ICD-10-CM | POA: Diagnosis not present

## 2022-01-31 DIAGNOSIS — J301 Allergic rhinitis due to pollen: Secondary | ICD-10-CM | POA: Diagnosis not present

## 2022-01-31 DIAGNOSIS — J3089 Other allergic rhinitis: Secondary | ICD-10-CM | POA: Diagnosis not present

## 2022-01-31 DIAGNOSIS — J3081 Allergic rhinitis due to animal (cat) (dog) hair and dander: Secondary | ICD-10-CM | POA: Diagnosis not present

## 2022-02-12 DIAGNOSIS — J3081 Allergic rhinitis due to animal (cat) (dog) hair and dander: Secondary | ICD-10-CM | POA: Diagnosis not present

## 2022-02-12 DIAGNOSIS — J3089 Other allergic rhinitis: Secondary | ICD-10-CM | POA: Diagnosis not present

## 2022-02-12 DIAGNOSIS — J301 Allergic rhinitis due to pollen: Secondary | ICD-10-CM | POA: Diagnosis not present

## 2022-03-15 ENCOUNTER — Ambulatory Visit: Payer: Medicare Other | Attending: Internal Medicine

## 2022-03-15 DIAGNOSIS — I495 Sick sinus syndrome: Secondary | ICD-10-CM | POA: Diagnosis not present

## 2022-03-15 LAB — CUP PACEART REMOTE DEVICE CHECK
Battery Remaining Percentage: 60 %
Brady Statistic RA Percent Paced: 42 %
Brady Statistic RV Percent Paced: 0 %
Date Time Interrogation Session: 20240118071345
Implantable Lead Connection Status: 753985
Implantable Lead Connection Status: 753985
Implantable Lead Implant Date: 20170420
Implantable Lead Implant Date: 20170420
Implantable Lead Location: 753859
Implantable Lead Location: 753860
Implantable Lead Model: 377
Implantable Lead Model: 377
Implantable Lead Serial Number: 49378160
Implantable Lead Serial Number: 49421706
Implantable Pulse Generator Implant Date: 20170420
Lead Channel Impedance Value: 429 Ohm
Lead Channel Impedance Value: 644 Ohm
Lead Channel Pacing Threshold Amplitude: 0.8 V
Lead Channel Pacing Threshold Amplitude: 0.9 V
Lead Channel Pacing Threshold Pulse Width: 0.4 ms
Lead Channel Pacing Threshold Pulse Width: 0.4 ms
Lead Channel Sensing Intrinsic Amplitude: 10 mV
Lead Channel Sensing Intrinsic Amplitude: 2 mV
Lead Channel Setting Pacing Amplitude: 2 V
Lead Channel Setting Pacing Amplitude: 2.4 V
Lead Channel Setting Pacing Pulse Width: 0.4 ms
Pulse Gen Model: 394969
Pulse Gen Serial Number: 68713503

## 2022-04-05 NOTE — Progress Notes (Signed)
Remote pacemaker transmission.   

## 2022-04-30 ENCOUNTER — Telehealth: Payer: Self-pay | Admitting: Internal Medicine

## 2022-04-30 DIAGNOSIS — I480 Paroxysmal atrial fibrillation: Secondary | ICD-10-CM

## 2022-04-30 NOTE — Telephone Encounter (Signed)
*  STAT* If patient is at the pharmacy, call can be transferred to refill team.   1. Which medications need to be refilled? (please list name of each medication and dose if known) Eliquis   2. Which pharmacy/location (including street and city if local pharmacy) is medication to be sent to? [facility] # [place], [place]   3. Do they need a 30 day or 90 day supply? [place]

## 2022-04-30 NOTE — Telephone Encounter (Signed)
Called pt reports Dr. Lovena Le allows her to switch to eliquis when she hits donut hole.  Pt would like a refill of Eliquis.

## 2022-04-30 NOTE — Telephone Encounter (Signed)
Is patient taking Eliquis or Xarelto?

## 2022-04-30 NOTE — Telephone Encounter (Signed)
Prescription refill request for Eliquis received. Indication: a fib Last office visit: 08/16/21 Scr: overdue for labs Age: 82 Weight: 64 kg

## 2022-05-01 MED ORDER — APIXABAN 5 MG PO TABS: 5.0000 mg | ORAL_TABLET | Freq: Two times a day (BID) | ORAL | 0 refills | Status: DC

## 2022-05-01 NOTE — Addendum Note (Signed)
Addended by: Rollen Sox on: 05/01/2022 05:07 PM   Modules accepted: Orders

## 2022-05-01 NOTE — Telephone Encounter (Signed)
Patient is calling again. She states she called yesterday and this morning, she is out of medication.

## 2022-05-01 NOTE — Telephone Encounter (Signed)
Spoke with patient. Advised one month of Eliquis sent to pharmacy and scheduled lab appointment for Monday 3/11

## 2022-05-01 NOTE — Telephone Encounter (Signed)
Patient is calling to check if her script for Eliquis was sent to [place] [facility].  Patient states she is completely out of medication. She would like a called back when the script is sent.

## 2022-05-07 ENCOUNTER — Ambulatory Visit: Payer: Medicare Other | Attending: Internal Medicine

## 2022-05-08 LAB — BASIC METABOLIC PANEL
BUN/Creatinine Ratio: 34 — ABNORMAL HIGH (ref 12–28)
BUN: 24 mg/dL (ref 8–27)
CO2: 25 mmol/L (ref 20–29)
Calcium: 9.9 mg/dL (ref 8.7–10.3)
Chloride: 101 mmol/L (ref 96–106)
Creatinine, Ser: 0.7 mg/dL (ref 0.57–1.00)
Glucose: 123 mg/dL — ABNORMAL HIGH (ref 70–99)
Potassium: 4.5 mmol/L (ref 3.5–5.2)
Sodium: 138 mmol/L (ref 134–144)
eGFR: 87 mL/min/{1.73_m2} (ref >59)

## 2022-05-09 ENCOUNTER — Telehealth: Payer: Self-pay

## 2022-05-09 NOTE — Telephone Encounter (Signed)
-----   Message from Evans Lance, MD sent at 05/08/2022  6:17 PM EDT ----- Labs are ok. No change

## 2022-05-26 ENCOUNTER — Other Ambulatory Visit: Payer: Self-pay | Admitting: Internal Medicine

## 2022-05-26 DIAGNOSIS — I480 Paroxysmal atrial fibrillation: Secondary | ICD-10-CM

## 2022-05-28 NOTE — Telephone Encounter (Signed)
Prescription refill request for Eliquis received. Indication: a fib Last office visit: 08/16/21 Scr: 0.7 05/07/22 epic Age: 82 Weight: 64kg

## 2022-06-10 IMAGING — DX DG CHEST 1V PORT
1 series · 1 of 1 positions shown · non-contrast
Comparison: 06/17/2015

CLINICAL DATA: Fatigue

EXAM:
PORTABLE CHEST 1 VIEW

[chest ap]
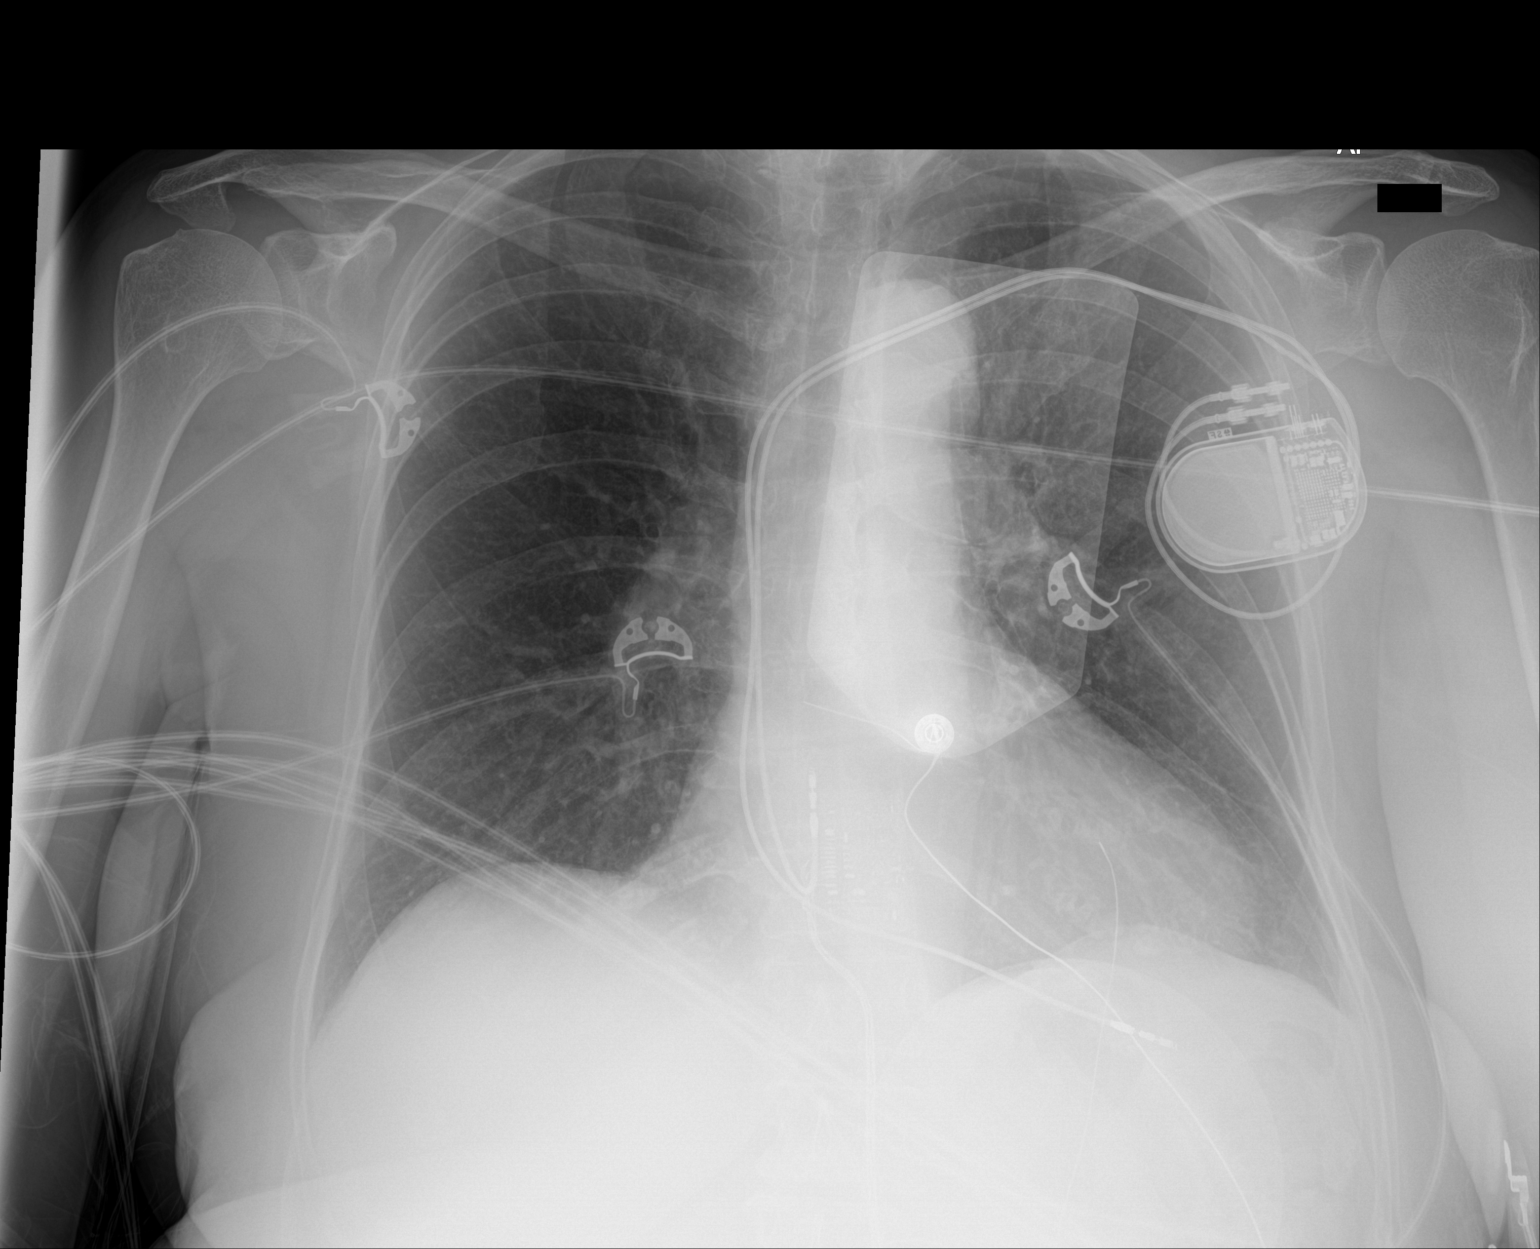

[1 of 1 positions shown; findings below may reference images not displayed]

FINDINGS: Overlying cardiac leads. Stable positioning of left-sided implanted
cardiac device. Heart size is within normal limits. No focal
airspace consolidation, pleural effusion, or pneumothorax.
IMPRESSION: No active disease.

## 2022-06-14 ENCOUNTER — Ambulatory Visit (INDEPENDENT_AMBULATORY_CARE_PROVIDER_SITE_OTHER): Payer: Medicare Other

## 2022-06-14 DIAGNOSIS — I495 Sick sinus syndrome: Secondary | ICD-10-CM

## 2022-06-14 LAB — CUP PACEART REMOTE DEVICE CHECK
Battery Voltage: 55
Date Time Interrogation Session: 20240418065635
Implantable Lead Connection Status: 753985
Implantable Lead Connection Status: 753985
Implantable Lead Implant Date: 20170420
Implantable Lead Implant Date: 20170420
Implantable Lead Location: 753859
Implantable Lead Location: 753860
Implantable Lead Model: 377
Implantable Lead Model: 377
Implantable Lead Serial Number: 49378160
Implantable Lead Serial Number: 49421706
Implantable Pulse Generator Implant Date: 20170420
Pulse Gen Model: 394969
Pulse Gen Serial Number: 68713503

## 2022-06-26 ENCOUNTER — Other Ambulatory Visit: Payer: Self-pay

## 2022-06-26 MED ORDER — METOPROLOL SUCCINATE ER 25 MG PO TB24: 25.0000 mg | ORAL_TABLET | Freq: Every day | ORAL | 0 refills | Status: DC

## 2022-06-26 NOTE — Telephone Encounter (Signed)
Pt's medication was sent to pt's pharmacy as requested. Confirmation received.  °

## 2022-07-16 NOTE — Progress Notes (Signed)
Remote pacemaker transmission.   

## 2022-09-10 ENCOUNTER — Other Ambulatory Visit: Payer: Self-pay | Admitting: *Deleted

## 2022-09-10 ENCOUNTER — Other Ambulatory Visit: Payer: Self-pay | Admitting: Internal Medicine

## 2022-09-10 DIAGNOSIS — I480 Paroxysmal atrial fibrillation: Secondary | ICD-10-CM

## 2022-09-10 NOTE — Telephone Encounter (Signed)
Prescription refill request for Eliquis received. Indication: Afib   Last office visit:08/16/21([place])  Scr: 0.70 (05/07/22)  Age: 82 Weight: 64.8kg  Appt overdue. Pt has scheduled appt on 09/26/22 with Dr Ladona Ridgel. Refill sent to prevent any missed doses.

## 2022-09-10 NOTE — Telephone Encounter (Signed)
 Refill sent.

## 2022-09-13 ENCOUNTER — Ambulatory Visit (INDEPENDENT_AMBULATORY_CARE_PROVIDER_SITE_OTHER): Payer: Medicare Other

## 2022-09-13 DIAGNOSIS — I495 Sick sinus syndrome: Secondary | ICD-10-CM | POA: Diagnosis not present

## 2022-09-13 LAB — CUP PACEART REMOTE DEVICE CHECK
Battery Voltage: 55
Date Time Interrogation Session: 20240718080007
Implantable Lead Connection Status: 753985
Implantable Lead Connection Status: 753985
Implantable Lead Implant Date: 20170420
Implantable Lead Implant Date: 20170420
Implantable Lead Location: 753859
Implantable Lead Location: 753860
Implantable Lead Model: 377
Implantable Lead Model: 377
Implantable Lead Serial Number: 49378160
Implantable Lead Serial Number: 49421706
Implantable Pulse Generator Implant Date: 20170420
Pulse Gen Model: 394969
Pulse Gen Serial Number: 68713503

## 2022-09-25 ENCOUNTER — Other Ambulatory Visit: Payer: Self-pay | Admitting: Internal Medicine

## 2022-09-26 ENCOUNTER — Ambulatory Visit: Payer: Medicare Other | Attending: Internal Medicine | Admitting: Internal Medicine

## 2022-09-26 ENCOUNTER — Encounter: Payer: Self-pay | Admitting: Internal Medicine

## 2022-09-26 VITALS — BP 136/90 | HR 71 | Ht 62.0 in | Wt 138.0 lb

## 2022-09-26 DIAGNOSIS — I480 Paroxysmal atrial fibrillation: Secondary | ICD-10-CM | POA: Diagnosis not present

## 2022-09-26 NOTE — Patient Instructions (Signed)
Medication Instructions:  Your physician recommends that you continue on your current medications as directed. Please refer to the Current Medication list given to you today.  *If you need a refill on your cardiac medications before your next appointment, please call your pharmacy*   Lab Work: None ordered If you have labs (blood work) drawn today and your tests are completely normal, you will receive your results only by: MyChart Message (if you have MyChart) OR A paper copy in the mail If you have any lab test that is abnormal or we need to change your treatment, we will call you to review the results.   Testing/Procedures: None ordered   Follow-Up: At [facility], you and your health needs are our priority.  As part of our continuing mission to provide you with exceptional heart care, we have created designated Provider Care Teams.  These Care Teams include your primary Cardiologist (physician) and Advanced Practice Providers (APPs -  Physician Assistants and Nurse Practitioners) who all work together to provide you with the care you need, when you need it.  Your next appointment:   1 year(s)  The format for your next appointment:   In Person  Provider:   You may see Lewayne Bunting, MD or one of the following Advanced Practice Providers on your designated Care Team:   Francis Dowse, [place] [address]" [place], [place] Sherie Don, NP Canary Brim, NP{

## 2022-09-26 NOTE — Progress Notes (Signed)
HPI Danielle Lucas returns today for followup of her atrial arrhythmias. She is a pleasant 82 yo woman with a h/o CHB, s/p PPM insertion, sinus node dysfunction, PAF,HTN, and NSVT. In the interim, she notes that she has not had more arrhythmia and denies chest pain or sob. She has started anti-coagulation with eliquis. She has not had any bleeding problems  Allergies  Allergen Reactions   Other Anaphylaxis    Cat dander   Codeine Nausea And Vomiting   Dilaudid [Hydromorphone Hcl] Nausea And Vomiting   Morphine And Codeine Nausea And Vomiting     Current Outpatient Medications  Medication Sig Dispense Refill   apixaban (ELIQUIS) 5 MG TABS tablet TAKE ONE TABLET BY MOUTH TWICE DAILY 180 tablet 0   B Complex Vitamins (VITAMIN-B COMPLEX PO) Take 1 capsule by mouth daily.      Calcium-Vitamin D-Vitamin K 500-500-40 MG-UNT-MCG CHEW Chew 1 capsule by mouth 2 (two) times daily.      Cholecalciferol (VITAMIN D) 2000 UNITS CAPS Take 2,000 Units by mouth daily.      Coenzyme Q10 (COQ10 PO) Take by mouth.     EPIPEN 2-PAK 0.3 MG/0.3ML SOAJ injection Inject 0.3 mg into the muscle as directed. Reported on 04/12/2015     escitalopram (LEXAPRO) 10 MG tablet Take 5 mg by mouth daily. Takes 1 tablet twice weekly     ipratropium (ATROVENT) 0.06 % nasal spray Place 1 spray into both nostrils 4 (four) times daily as needed for rhinitis.     metoprolol succinate (TOPROL-XL) 25 MG 24 hr tablet Take 1 tablet (25 mg total) by mouth daily. Please keep scheduled appointment for future refills. Thank you. 30 tablet 0   OVER THE COUNTER MEDICATION Liquid tumeric 15 mg daily     pravastatin (PRAVACHOL) 20 MG tablet Take 1 tablet (20 mg total) by mouth every evening. Take four times weekly.     TURMERIC PO Take 15 mLs by mouth daily.      valsartan (DIOVAN) 80 MG tablet Take 80 mg by mouth daily.     VENTOLIN HFA 108 (90 BASE) MCG/ACT inhaler Inhale 1-2 puffs into the lungs daily as needed for wheezing or  shortness of breath.      Zoledronic Acid (RECLAST IV) Inject into the vein See admin instructions. Uses every other year. Unknown Dose     No current facility-administered medications for this visit.     Past Medical History:  Diagnosis Date   Abnormal Pap smear of cervix    Acute respiratory failure with hypoxia (HCC)    Asthma    "I'll have an attack if I'm around cat or dust"   Asystole [facility])    Atrial fibrillation (HCC)    CHB (complete heart block) (HCC) 05/2015   s/p Biotronik (serial number [id number]) dual lead pacemaker   Depression    Exercise-induced asthma    Hx of colonic polyps    Hypertension    Osteopenia    PONV (postoperative nausea and vomiting)    pt. thinks it was from dilaudid   Presence of permanent cardiac pacemaker     ROS:   All systems reviewed and negative except as noted in the HPI.   Past Surgical History:  Procedure Laterality Date   ABDOMINAL HYSTERECTOMY N/A 07/28/2013   Procedure: HYSTERECTOMY ABDOMINAL with SALPINGO OOPHORECTOMY;  Surgeon: Annamaria Boots, MD;  Location: [facility];  Service: Gynecology;  Laterality: N/A;  request 4.5 hours   ABDOMINAL SACROCOLPOPEXY N/A  07/28/2013   Procedure: ABDOMINO SACROCOLPOPEXY;  Surgeon: Jacqualin Combes de Gwenevere Ghazi, MD;  Location: [facility];  Service: Gynecology;  Laterality: N/A;   APPENDECTOMY     BLADDER SUSPENSION N/A 07/28/2013   Procedure: TRANSVAGINAL TAPE (TVT) PROCEDURE midurethral sling;  Surgeon: Forrestine Him Amundson de Gwenevere Ghazi, MD;  Location: [facility];  Service: Gynecology;  Laterality: N/A;   CHOLECYSTECTOMY OPEN     CRYOTHERAPY     for abnormal pap smear yrs ago   CYSTOSCOPY N/A 07/28/2013   Procedure: CYSTOSCOPY;  Surgeon: Jacqualin Combes de Gwenevere Ghazi, MD;  Location: [facility];  Service: Gynecology;  Laterality: N/A;   EP IMPLANTABLE DEVICE N/A 06/16/2015   Procedure: Pacemaker Implant;  Surgeon: Marinus Maw, MD; Biotronik (serial number [id number]) pacemaker; Laterality:  Left   EP IMPLANTABLE DEVICE N/A 06/16/2015   Procedure: Loop Recorder Removal;  Surgeon: Marinus Maw, MD;  Location: [facility] INVASIVE CV LAB;  Service: Cardiovascular;  Laterality: N/A;   FRACTURE SURGERY     INSERT / REPLACE / REMOVE PACEMAKER  06/16/2015   LOOP RECORDER IMPLANT N/A 10/21/2013   Procedure: LOOP RECORDER IMPLANT;  Surgeon: Marinus Maw, MD;  Location: [facility] CATH LAB;  Service: Cardiovascular;  Laterality: N/A;   LOOP RECORDER REMOVAL  06/16/2015   TEMPORARY PACEMAKER INSERTION N/A 07/29/2013   Procedure: TEMPORARY PACEMAKER INSERTION;  Surgeon: Iran Ouch, MD;  Location: [facility];  Service: Cardiovascular;  Laterality: N/A;   TONSILLECTOMY  1940s   TOTAL ABDOMINAL HYSTERECTOMY W/ BILATERAL SALPINGOOPHORECTOMY  2015   Abdominal sacrocolpopexy, tension-free vaginal tape Exact, cystoscopy    TUBAL LIGATION     WRIST FRACTURE SURGERY Left    plate inserted     Family History  Problem Relation Age of Onset   Varicose Veins Mother    Heart attack Father    Cancer Brother        colon cancer (polyps)     Social History   Socioeconomic History   Marital status: Divorced    Spouse name: Not on file   Number of children: Not on file   Years of education: Not on file   Highest education level: Not on file  Occupational History   Not on file  Tobacco Use   Smoking status: Former    Current packs/day: 0.00    Average packs/day: 1 pack/day for 30.0 years (30.0 ttl pk-yrs)    Types: Cigarettes    Start date: 07/03/1958    Quit date: 07/02/1988    Years since quitting: 34.2   Smokeless tobacco: Never  Vaping Use   Vaping status: Never Used  Substance and Sexual Activity   Alcohol use: Yes    Alcohol/week: 0.0 - 1.0 standard drinks of alcohol   Drug use: No   Sexual activity: Not Currently    Partners: Male    Birth control/protection: Post-menopausal, Surgical    Comment: TAH/BSO  Other Topics Concern   Not on file  Social History Narrative   Not on file    Social Determinants of Health   Financial Resource Strain: Not on file  Food Insecurity: Not on file  Transportation Needs: Not on file  Physical Activity: Not on file  Stress: Not on file  Social Connections: Not on file  Intimate Partner Violence: Not on file     BP (!) 136/90   Pulse 71   Ht 5\' 2"  (1.575 m)   Wt 138 lb (62.6 kg)   LMP  02/27/2000   SpO2 98%   BMI 25.24 kg/m   Physical Exam:  Well appearing NAD HEENT: Unremarkable Neck:  No JVD, no thyromegally Lymphatics:  No adenopathy Back:  No CVA tenderness Lungs:  Clear with no wheezes HEART:  Regular rate rhythm, no murmurs, no rubs, no clicks Abd:  soft, positive bowel sounds, no organomegally, no rebound, no guarding Ext:  2 plus pulses, no edema, no cyanosis, no clubbing Skin:  No rashes no nodules Neuro:  CN II through XII intact, motor grossly intact  EKG - nsr with atrial pacing  DEVICE  Normal device function.  See PaceArt for details.   Assess/Plan: 1. PAF - she is maintaining NSR. No change in her meds. Her last episode of atrial fib was over a year ago and lasted 6 hours. 2. HTN - her bp is well controlled. 3. Sinus node dysfunction - she is atrial pacing about half of the time. 4. PPM - her Biotronik DDD PM is working normally.   Danielle Gowda Hilliard Borges,MD

## 2022-09-27 NOTE — Progress Notes (Signed)
Remote pacemaker transmission.   

## 2022-10-24 ENCOUNTER — Other Ambulatory Visit: Payer: Self-pay | Admitting: Internal Medicine

## 2022-12-13 ENCOUNTER — Ambulatory Visit (INDEPENDENT_AMBULATORY_CARE_PROVIDER_SITE_OTHER): Payer: Medicare Other

## 2022-12-13 DIAGNOSIS — I480 Paroxysmal atrial fibrillation: Secondary | ICD-10-CM

## 2022-12-13 DIAGNOSIS — I495 Sick sinus syndrome: Secondary | ICD-10-CM

## 2022-12-14 LAB — CUP PACEART REMOTE DEVICE CHECK
Date Time Interrogation Session: 20241017073434
Implantable Lead Connection Status: 753985
Implantable Lead Connection Status: 753985
Implantable Lead Implant Date: 20170420
Implantable Lead Implant Date: 20170420
Implantable Lead Location: 753859
Implantable Lead Location: 753860
Implantable Lead Model: 377
Implantable Lead Model: 377
Implantable Lead Serial Number: 49378160
Implantable Lead Serial Number: 49421706
Implantable Pulse Generator Implant Date: 20170420
Pulse Gen Model: 394969
Pulse Gen Serial Number: 68713503

## 2022-12-28 ENCOUNTER — Telehealth: Payer: Self-pay

## 2022-12-28 NOTE — Telephone Encounter (Signed)
Patient would like to switch to xarelto and send to [facility] pharmacy. Patient stated she would not qualify for any patient assistance programs. Will send message to Dr. Ladona Ridgel to make switch.

## 2022-12-28 NOTE — Telephone Encounter (Signed)
The pt called stating she is in a tight spot with her Eliquis. She would like to switch to xarelto until January. She would like for the nurse to give her a call back at [phone].

## 2022-12-29 NOTE — Telephone Encounter (Signed)
Ok to switch 

## 2022-12-31 MED ORDER — RIVAROXABAN 20 MG PO TABS: 20.0000 mg | ORAL_TABLET | Freq: Every day | ORAL | 0 refills | Status: DC

## 2022-12-31 NOTE — Telephone Encounter (Signed)
Called patient back to let her know that we switched her to Xarelto and sent to pharmacy of her choice. Sent mychart message of pharmacy's address and phone number. If she has any issue with the pharmacy, she will give our office a call back.

## 2023-01-02 NOTE — Progress Notes (Signed)
Remote pacemaker transmission.   

## 2023-01-15 ENCOUNTER — Encounter (HOSPITAL_BASED_OUTPATIENT_CLINIC_OR_DEPARTMENT_OTHER): Payer: Self-pay | Admitting: Obstetrics & Gynecology

## 2023-03-04 ENCOUNTER — Other Ambulatory Visit: Payer: Self-pay | Admitting: Medical Genetics

## 2023-03-14 ENCOUNTER — Ambulatory Visit (INDEPENDENT_AMBULATORY_CARE_PROVIDER_SITE_OTHER): Payer: Medicare Other

## 2023-03-14 DIAGNOSIS — I495 Sick sinus syndrome: Secondary | ICD-10-CM

## 2023-03-14 LAB — CUP PACEART REMOTE DEVICE CHECK
Battery Voltage: 50
Date Time Interrogation Session: 20250116084313
Implantable Lead Connection Status: 753985
Implantable Lead Connection Status: 753985
Implantable Lead Implant Date: 20170420
Implantable Lead Implant Date: 20170420
Implantable Lead Location: 753859
Implantable Lead Location: 753860
Implantable Lead Model: 377
Implantable Lead Model: 377
Implantable Lead Serial Number: 49378160
Implantable Lead Serial Number: 49421706
Implantable Pulse Generator Implant Date: 20170420
Pulse Gen Model: 394969
Pulse Gen Serial Number: 68713503

## 2023-03-26 ENCOUNTER — Telehealth: Payer: Self-pay | Admitting: Internal Medicine

## 2023-03-26 DIAGNOSIS — I480 Paroxysmal atrial fibrillation: Secondary | ICD-10-CM

## 2023-03-26 NOTE — Telephone Encounter (Signed)
*  STAT* If patient is at the pharmacy, call can be transferred to refill team.   1. Which medications need to be refilled? (please list name of each medication and dose if known) rivaroxaban (XARELTO) 20 MG TABS tablet    2. Would you like to learn more about the convenience, safety, & potential cost savings by using the [facility]?      3. Are you open to using the [facility] (Type [facility]. ).   4. Which pharmacy/location (including street and city if local pharmacy) is medication to be sent to? [id number] - [place], [place] - [address]    5. Do they need a 30 day or 90 day supply? 90

## 2023-03-27 MED ORDER — RIVAROXABAN 20 MG PO TABS: 20.0000 mg | ORAL_TABLET | Freq: Every day | ORAL | 1 refills | Status: DC

## 2023-03-27 NOTE — Telephone Encounter (Signed)
Prescription refill request for Xarelto received.  Indication: Afib  Last office visit: 09/26/22 Ladona Ridgel)  Weight: 62.6kg Age: 83 Scr: 0.70 (05/07/22) CrCl: 62.65ml/min  Appropriate dose. Refill sent.

## 2023-03-28 ENCOUNTER — Other Ambulatory Visit (HOSPITAL_COMMUNITY): Payer: Self-pay

## 2023-04-04 ENCOUNTER — Other Ambulatory Visit (HOSPITAL_COMMUNITY): Payer: Self-pay

## 2023-04-04 ENCOUNTER — Other Ambulatory Visit: Payer: Self-pay

## 2023-04-04 ENCOUNTER — Telehealth: Payer: Self-pay | Admitting: Internal Medicine

## 2023-04-04 DIAGNOSIS — I480 Paroxysmal atrial fibrillation: Secondary | ICD-10-CM

## 2023-04-04 MED ORDER — APIXABAN 5 MG PO TABS
5.0000 mg | ORAL_TABLET | Freq: Two times a day (BID) | ORAL | 0 refills | Status: DC
  Filled 2023-04-04: qty 180, 90d supply, fill #0

## 2023-04-04 NOTE — Telephone Encounter (Signed)
*  STAT* If patient is at the pharmacy, call can be transferred to refill team.   1. Which medications need to be refilled? (please list name of each medication and dose if known)  liquis   2. Which pharmacy/location (including street and city if local pharmacy) is medication to be sent to?  [id number] - [place], [place] - [address]  3. Do they need a 30 day or 90 day supply?   90 day supply  Patient states she thought she requested Eliquis , not Xarelto  for previous refill request. She would like to cancel Xarelto  order and go back on Eliquis  if possible.

## 2023-04-09 ENCOUNTER — Other Ambulatory Visit (HOSPITAL_COMMUNITY): Payer: Self-pay

## 2023-04-15 ENCOUNTER — Other Ambulatory Visit (HOSPITAL_COMMUNITY)
Admission: RE | Admit: 2023-04-15 | Discharge: 2023-04-15 | Disposition: A | Discharge status: Home / Self Care | Payer: Self-pay | Source: Ambulatory Visit | Attending: Oncology | Admitting: Oncology

## 2023-04-17 ENCOUNTER — Other Ambulatory Visit (HOSPITAL_COMMUNITY): Payer: Self-pay

## 2023-04-18 ENCOUNTER — Other Ambulatory Visit (HOSPITAL_COMMUNITY): Payer: Self-pay

## 2023-04-22 NOTE — Progress Notes (Signed)
 Remote pacemaker transmission.

## 2023-04-24 LAB — GENECONNECT MOLECULAR SCREEN: Genetic Analysis Overall Interpretation: NEGATIVE

## 2023-05-07 ENCOUNTER — Other Ambulatory Visit (HOSPITAL_COMMUNITY): Payer: Self-pay | Admitting: *Deleted

## 2023-05-09 ENCOUNTER — Ambulatory Visit (HOSPITAL_COMMUNITY)
Admission: RE | Admit: 2023-05-09 | Discharge: 2023-05-09 | Disposition: A | Discharge status: Home / Self Care | Source: Ambulatory Visit | Attending: Internal Medicine | Admitting: Internal Medicine

## 2023-05-09 DIAGNOSIS — M810 Age-related osteoporosis without current pathological fracture: Secondary | ICD-10-CM | POA: Diagnosis present

## 2023-05-09 MED ORDER — DENOSUMAB 60 MG/ML ~~LOC~~ SOSY
PREFILLED_SYRINGE | SUBCUTANEOUS | Status: AC
  Filled 2023-05-09: qty 1

## 2023-05-09 MED ORDER — DENOSUMAB 60 MG/ML ~~LOC~~ SOSY
60.0000 mg | PREFILLED_SYRINGE | Freq: Once | SUBCUTANEOUS | Status: AC
  Administered 2023-05-09: 60 mg via SUBCUTANEOUS

## 2023-05-13 DIAGNOSIS — E7800 Pure hypercholesterolemia, unspecified: Secondary | ICD-10-CM | POA: Insufficient documentation

## 2023-06-13 ENCOUNTER — Ambulatory Visit (INDEPENDENT_AMBULATORY_CARE_PROVIDER_SITE_OTHER): Payer: Medicare HMO

## 2023-06-13 DIAGNOSIS — I495 Sick sinus syndrome: Secondary | ICD-10-CM | POA: Diagnosis not present

## 2023-06-15 LAB — CUP PACEART REMOTE DEVICE CHECK
Date Time Interrogation Session: 20250417103457
Implantable Lead Connection Status: 753985
Implantable Lead Connection Status: 753985
Implantable Lead Implant Date: 20170420
Implantable Lead Implant Date: 20170420
Implantable Lead Location: 753859
Implantable Lead Location: 753860
Implantable Lead Model: 377
Implantable Lead Model: 377
Implantable Lead Serial Number: 49378160
Implantable Lead Serial Number: 49421706
Implantable Pulse Generator Implant Date: 20170420
Pulse Gen Model: 394969
Pulse Gen Serial Number: 68713503

## 2023-06-18 ENCOUNTER — Encounter: Payer: Self-pay | Admitting: Internal Medicine

## 2023-06-30 ENCOUNTER — Other Ambulatory Visit: Payer: Self-pay | Admitting: Internal Medicine

## 2023-06-30 DIAGNOSIS — I480 Paroxysmal atrial fibrillation: Secondary | ICD-10-CM

## 2023-07-01 MED ORDER — APIXABAN 5 MG PO TABS: 5.0000 mg | ORAL_TABLET | Freq: Two times a day (BID) | ORAL | 1 refills | Status: DC

## 2023-07-01 NOTE — Telephone Encounter (Signed)
 Prescription refill request for Eliquis  received. Indication:afib Last office visit:7/24 Scr:0.7 5/24 Age: 83 Weight:62.6  kg  Prescription refilled

## 2023-07-05 ENCOUNTER — Other Ambulatory Visit (HOSPITAL_COMMUNITY): Payer: Self-pay

## 2023-07-24 NOTE — Progress Notes (Signed)
 Remote pacemaker transmission.

## 2023-07-24 NOTE — Addendum Note (Signed)
 Addended by: Lott Rouleau A on: 07/24/2023 11:36 AM   Modules accepted: Orders

## 2023-09-12 ENCOUNTER — Ambulatory Visit (INDEPENDENT_AMBULATORY_CARE_PROVIDER_SITE_OTHER): Payer: Medicare HMO

## 2023-09-12 DIAGNOSIS — I495 Sick sinus syndrome: Secondary | ICD-10-CM

## 2023-09-12 LAB — CUP PACEART REMOTE DEVICE CHECK
Battery Voltage: 50
Date Time Interrogation Session: 20250717112157
Implantable Lead Connection Status: 753985
Implantable Lead Connection Status: 753985
Implantable Lead Implant Date: 20170420
Implantable Lead Implant Date: 20170420
Implantable Lead Location: 753859
Implantable Lead Location: 753860
Implantable Lead Model: 377
Implantable Lead Model: 377
Implantable Lead Serial Number: 49378160
Implantable Lead Serial Number: 49421706
Implantable Pulse Generator Implant Date: 20170420
Pulse Gen Model: 394969
Pulse Gen Serial Number: 68713503

## 2023-09-16 ENCOUNTER — Ambulatory Visit: Payer: Self-pay | Admitting: Internal Medicine

## 2023-10-04 ENCOUNTER — Ambulatory Visit: Attending: Internal Medicine | Admitting: Internal Medicine

## 2023-10-04 ENCOUNTER — Encounter: Payer: Self-pay | Admitting: Internal Medicine

## 2023-10-04 VITALS — BP 116/62 | HR 69 | Ht 62.0 in | Wt 138.5 lb

## 2023-10-04 DIAGNOSIS — I480 Paroxysmal atrial fibrillation: Secondary | ICD-10-CM | POA: Diagnosis not present

## 2023-10-04 LAB — CUP PACEART INCLINIC DEVICE CHECK
Date Time Interrogation Session: 20250808120507
Implantable Lead Connection Status: 753985
Implantable Lead Connection Status: 753985
Implantable Lead Implant Date: 20170420
Implantable Lead Implant Date: 20170420
Implantable Lead Location: 753859
Implantable Lead Location: 753860
Implantable Lead Model: 377
Implantable Lead Model: 377
Implantable Lead Serial Number: 49378160
Implantable Lead Serial Number: 49421706
Implantable Pulse Generator Implant Date: 20170420
Pulse Gen Model: 394969
Pulse Gen Serial Number: 68713503

## 2023-10-04 NOTE — Patient Instructions (Signed)

## 2023-10-04 NOTE — Progress Notes (Signed)
 HPI Danielle Lucas returns today for followup. She is a pleasant 83 yo woman with a h/o sinus node dysfunction, PAF, who has tolerated eliquis . She feels well. She returned from a trip to [place]. She denies sob or chest pain or palpitations. No bleeding on eliquis .  Allergies  Allergen Reactions   Other Anaphylaxis    Cat dander   Codeine Nausea And Vomiting   Dilaudid [Hydromorphone Hcl] Nausea And Vomiting   Morphine  And Codeine Nausea And Vomiting     Current Outpatient Medications  Medication Sig Dispense Refill   apixaban  (ELIQUIS ) 5 MG TABS tablet Take 1 tablet (5 mg total) by mouth 2 (two) times daily. 180 tablet 1   B Complex Vitamins (VITAMIN-B COMPLEX PO) Take 1 capsule by mouth daily.      Cholecalciferol (VITAMIN D) 2000 UNITS CAPS Take 2,000 Units by mouth daily.      Coenzyme Q10 (COQ10 PO) Take by mouth.     EPIPEN  2-PAK 0.3 MG/0.3ML SOAJ injection Inject 0.3 mg into the muscle as directed. Reported on 04/12/2015     escitalopram  (LEXAPRO ) 10 MG tablet Take 5 mg by mouth daily. Takes 1 tablet twice weekly     metoprolol  succinate (TOPROL -XL) 25 MG 24 hr tablet Take 1 tablet (25 mg total) by mouth daily. 90 tablet 3   OVER THE COUNTER MEDICATION Liquid tumeric 15 mg daily     pravastatin  (PRAVACHOL ) 20 MG tablet Take 1 tablet (20 mg total) by mouth every evening. Take four times weekly.     TURMERIC PO Take 15 mLs by mouth daily.      valsartan (DIOVAN) 80 MG tablet Take 80 mg by mouth daily.     VENTOLIN  HFA 108 (90 BASE) MCG/ACT inhaler Inhale 1-2 puffs into the lungs daily as needed for wheezing or shortness of breath.      No current facility-administered medications for this visit.     Past Medical History:  Diagnosis Date   Abnormal Pap smear of cervix    Acute respiratory failure with hypoxia (HCC)    Asthma    I'll have an attack if I'm around cat or dust   Asystole [facility])    Atrial fibrillation (HCC)    CHB (complete heart block) (HCC) 05/2015    s/p Biotronik (serial number [id number]) dual lead pacemaker   Depression    Exercise-induced asthma    Hx of colonic polyps    Hypertension    Osteopenia    PONV (postoperative nausea and vomiting)    pt. thinks it was from dilaudid   Presence of permanent cardiac pacemaker     ROS:   All systems reviewed and negative except as noted in the HPI.   Past Surgical History:  Procedure Laterality Date   ABDOMINAL HYSTERECTOMY N/A 07/28/2013   Procedure: HYSTERECTOMY ABDOMINAL with SALPINGO OOPHORECTOMY;  Surgeon: Ronal Elvie Pinal, MD;  Location: [facility];  Service: Gynecology;  Laterality: N/A;  request 4.5 hours   ABDOMINAL SACROCOLPOPEXY N/A 07/28/2013   Procedure: ABDOMINO SACROCOLPOPEXY;  Surgeon: Bobie FORBES Crown de Charlynn FORBES Cary, MD;  Location: [facility];  Service: Gynecology;  Laterality: N/A;   APPENDECTOMY     BLADDER SUSPENSION N/A 07/28/2013   Procedure: TRANSVAGINAL TAPE (TVT) PROCEDURE midurethral sling;  Surgeon: Bobie FORBES Amundson de Charlynn FORBES Cary, MD;  Location: [facility];  Service: Gynecology;  Laterality: N/A;   CHOLECYSTECTOMY OPEN     CRYOTHERAPY     for abnormal pap smear yrs  ago   CYSTOSCOPY N/A 07/28/2013   Procedure: CYSTOSCOPY;  Surgeon: Bobie FORBES Crown de Charlynn FORBES Cary, MD;  Location: [facility];  Service: Gynecology;  Laterality: N/A;   EP IMPLANTABLE DEVICE N/A 06/16/2015   Procedure: Pacemaker Implant;  Surgeon: Danelle LELON Birmingham, MD; Biotronik (serial number [id number]) pacemaker; Laterality: Left   EP IMPLANTABLE DEVICE N/A 06/16/2015   Procedure: Loop Recorder Removal;  Surgeon: Danelle LELON Birmingham, MD;  Location: [facility] INVASIVE CV LAB;  Service: Cardiovascular;  Laterality: N/A;   FRACTURE SURGERY     INSERT / REPLACE / REMOVE PACEMAKER  06/16/2015   LOOP RECORDER IMPLANT N/A 10/21/2013   Procedure: LOOP RECORDER IMPLANT;  Surgeon: Danelle LELON Birmingham, MD;  Location: [facility] CATH LAB;  Service: Cardiovascular;  Laterality: N/A;   LOOP RECORDER REMOVAL  06/16/2015   TEMPORARY PACEMAKER  INSERTION N/A 07/29/2013   Procedure: TEMPORARY PACEMAKER INSERTION;  Surgeon: Deatrice DELENA Cage, MD;  Location: [facility] CATH [facility];  Service: Cardiovascular;  Laterality: N/A;   TONSILLECTOMY  1940s   TOTAL ABDOMINAL HYSTERECTOMY W/ BILATERAL SALPINGOOPHORECTOMY  2015   Abdominal sacrocolpopexy, tension-free vaginal tape Exact, cystoscopy    TUBAL LIGATION     WRIST FRACTURE SURGERY Left    plate inserted     Family History  Problem Relation Age of Onset   Varicose Veins Mother    Heart attack Father    Cancer Brother        colon cancer (polyps)     Social History   Socioeconomic History   Marital status: Divorced    Spouse name: Not on file   Number of children: Not on file   Years of education: Not on file   Highest education level: Not on file  Occupational History   Not on file  Tobacco Use   Smoking status: Former    Current packs/day: 0.00    Average packs/day: 1 pack/day for 30.0 years (30.0 ttl pk-yrs)    Types: Cigarettes    Start date: 07/03/1958    Quit date: 07/02/1988    Years since quitting: 35.2   Smokeless tobacco: Never  Vaping Use   Vaping status: Never Used  Substance and Sexual Activity   Alcohol  use: Yes    Alcohol /week: 0.0 - 1.0 standard drinks of alcohol    Drug use: No   Sexual activity: Not Currently    Partners: Male    Birth control/protection: Post-menopausal, Surgical    Comment: TAH/BSO  Other Topics Concern   Not on file  Social History Narrative   Not on file   Social Drivers of Health   Financial Resource Strain: Not on file  Food Insecurity: Not on file  Transportation Needs: Not on file  Physical Activity: Not on file  Stress: Not on file  Social Connections: Not on file  Intimate Partner Violence: Not on file     BP 116/62   Pulse 69   Ht 5' 2 (1.575 m)   Wt 138 lb 8 oz (62.8 kg)   LMP 02/27/2000   SpO2 98%   BMI 25.33 kg/m   Physical Exam:  Well appearing NAD HEENT: Unremarkable Neck:  No JVD, no  thyromegally Lymphatics:  No adenopathy Back:  No CVA tenderness Lungs:  Clear with no wheezes HEART:  Regular rate rhythm, no murmurs, no rubs, no clicks Abd:  soft, positive bowel sounds, no organomegally, no rebound, no guarding Ext:  2 plus pulses, no edema, no cyanosis, no clubbing Skin:  No rashes no nodules Neuro:  CN II through XII intact, motor grossly intact  EKG - atrial pacing  DEVICE  Normal device function.  See PaceArt for details.   Assess/Plan: SND - she is asymptomatic s/p PPM insertion.  PPM -her Biotronik DDD PM is working normally. PAF - she has only had one hour of afib in the last year. We discussed the possibility of stopping her eliquis . For now we will hold as she has had long episodes remotely. 4. HTN - her bp is well controlled. We will follow.

## 2023-10-07 ENCOUNTER — Telehealth (HOSPITAL_COMMUNITY): Payer: Self-pay | Admitting: Pharmacy Technician

## 2023-10-07 NOTE — Telephone Encounter (Signed)
 Auth Submission: APPROVED Site of care: [facility] Payer: [employer] Medicare Medication & CPT/J Code(s) submitted: Prolia  (Denosumab ) [id number] Diagnosis Code: M81.0 Route of submission (phone, fax, portal): portal Phone # Fax # Auth type: Buy/Bill HB Units/visits requested: 60mg  x 2 doses, q 6 months Reference number: [id number] Approval from: 10/07/23 to 10/06/24    Dagoberto Armour, CPhT [facility] Phone: [phone] 10/07/2023

## 2023-10-16 ENCOUNTER — Encounter (HOSPITAL_BASED_OUTPATIENT_CLINIC_OR_DEPARTMENT_OTHER): Payer: Self-pay | Admitting: Obstetrics & Gynecology

## 2023-10-16 ENCOUNTER — Encounter (HOSPITAL_BASED_OUTPATIENT_CLINIC_OR_DEPARTMENT_OTHER): Payer: Self-pay

## 2023-10-16 ENCOUNTER — Ambulatory Visit (HOSPITAL_BASED_OUTPATIENT_CLINIC_OR_DEPARTMENT_OTHER): Admitting: Obstetrics & Gynecology

## 2023-10-16 ENCOUNTER — Other Ambulatory Visit (HOSPITAL_BASED_OUTPATIENT_CLINIC_OR_DEPARTMENT_OTHER): Payer: Self-pay

## 2023-10-16 VITALS — BP 134/81 | HR 65 | Ht 62.0 in | Wt 138.0 lb

## 2023-10-16 DIAGNOSIS — Z90710 Acquired absence of both cervix and uterus: Secondary | ICD-10-CM | POA: Diagnosis not present

## 2023-10-16 DIAGNOSIS — Z01411 Encounter for gynecological examination (general) (routine) with abnormal findings: Secondary | ICD-10-CM

## 2023-10-16 DIAGNOSIS — G3184 Mild cognitive impairment, so stated: Secondary | ICD-10-CM | POA: Insufficient documentation

## 2023-10-16 DIAGNOSIS — R001 Bradycardia, unspecified: Secondary | ICD-10-CM | POA: Insufficient documentation

## 2023-10-16 DIAGNOSIS — M810 Age-related osteoporosis without current pathological fracture: Secondary | ICD-10-CM | POA: Diagnosis not present

## 2023-10-16 DIAGNOSIS — Z01419 Encounter for gynecological examination (general) (routine) without abnormal findings: Secondary | ICD-10-CM

## 2023-10-16 DIAGNOSIS — Z860100 Personal history of colon polyps, unspecified: Secondary | ICD-10-CM | POA: Insufficient documentation

## 2023-10-16 DIAGNOSIS — N8111 Cystocele, midline: Secondary | ICD-10-CM

## 2023-10-16 DIAGNOSIS — Z800 Family history of malignant neoplasm of digestive organs: Secondary | ICD-10-CM | POA: Insufficient documentation

## 2023-10-16 NOTE — Progress Notes (Addendum)
 Breast and Pelvic Exam Patient name: Danielle Lucas MRN [id number]  Date of birth: [birth year] Chief Complaint:   Breast and Pelvic Exam  History of Present Illness:   Danielle Lucas is a 83 y.o. G80P2002 Caucasian female being seen today for breast and pelvic exam.  Denies vaginal bleeding.  She does have urinary urgency at night and wakes up at least once nightly.  Does feel like she needs to sit on the toilet to really empty adequately.  No recent UTI.  Bowel function is normal.  Denies pelvic pain.  She has tried myrbetriq .  Discussed urogyn referral but she declines for now.    H/o osteoporosis.  On Prolia  and has received one dosage.    Dr. Shayne is her PCP.  She has recently restarted Lexapro  5mg .    Patient's last menstrual period was 02/27/2000.  Last pap 2014. Results were: Normal. H/O abnormal pap: no Last mammogram: 01/09/2023. Results were: normal. Family h/o breast cancer: no Last colonoscopy: 12/22/2020. Results were: normal. Family h/o colorectal cancer: no.  Done with Dr. Rollin.       09/12/2020    1:59 PM  Depression screen PHQ 2/9  Decreased Interest 0  Down, Depressed, Hopeless 0  PHQ - 2 Score 0    Review of Systems:   Pertinent items are noted in HPI Denies any bowel changes, vaginal discharge, vaginal bleeding or pelvic pain.   Pertinent History Reviewed:  Reviewed past medical,surgical, social and family history.  Reviewed problem list, medications and allergies. Physical Assessment:   Vitals:   10/16/23 1549  BP: 134/81  Pulse: 65  SpO2: 100%  Weight: 138 lb (62.6 kg)  Height: 5' 2 (1.575 m)  Body mass index is 25.24 kg/m.        Physical Examination:   General appearance - well appearing, and in no distress  Mental status - alert, oriented to person, place, and time  Psych:  She has a normal mood and affect  Skin - warm and dry, normal color, no suspicious lesions noted  Chest - effort normal, all lung fields clear to auscultation  bilaterally  Heart - normal rate and regular rhythm  Neck:  midline trachea, no thyromegaly or nodules  Breasts - breasts appear normal, no suspicious masses, no skin or nipple changes or  axillary nodes  Abdomen - soft, nontender, nondistended, no masses or organomegaly  Pelvic - VULVA: normal appearing vulva with no masses, tenderness or lesions   VAGINA: normal appearing vagina with normal color and discharge, no lesions, second degree cystocele noted  CERVIX: surgically absent  Thin prep pap is not indicated  UTERUS: surgically absent  ADNEXA: No adnexal masses or tenderness noted.  Rectal - normal rectal, good sphincter tone, no masses felt.  Extremities:  No swelling or varicosities noted  Chaperone present for exam  No results found for this or any previous visit (from the past 24 hours).  Assessment & Plan:  1. Encntr for gyn exam (general) (routine) w/o abn findings (Primary) - Pap smear not indicated due to hysterectomy - Mammogram 01/09/2023. - Colonoscopy 12/22/2020. - lab work done with PCP, Dr. Rex - vaccines reviewed/updated  2. H/O: hysterectomy - TAH/BSO with abdominal sacrocolpopexy and TVT placed.  Surgery done on 07/28/2013.  3. Age-related osteoporosis without current pathological fracture - followed by Dr. Shayne  4. Cystocele, midline -Not feel any treatment is necessary for this at this time.  Did not feel patient needs a pessary at this time.  Will continue to follow this conservatively.  She is not having UTIs.   No orders of the defined types were placed in this encounter.   Meds: No orders of the defined types were placed in this encounter.   Follow-up: As needed or with any new problems/concerns  Ronal GORMAN Pinal, MD 10/20/2023 12:01 AM

## 2023-10-20 NOTE — Addendum Note (Signed)
 Addended by: CLEOTILDE RONAL RAMAN on: 10/20/2023 12:02 AM   Modules accepted: Level of Service

## 2023-11-07 ENCOUNTER — Other Ambulatory Visit (HOSPITAL_COMMUNITY): Payer: Self-pay | Admitting: Internal Medicine

## 2023-11-18 ENCOUNTER — Ambulatory Visit (HOSPITAL_COMMUNITY)
Admission: RE | Admit: 2023-11-18 | Discharge: 2023-11-18 | Disposition: A | Discharge status: Home / Self Care | Source: Ambulatory Visit | Attending: Internal Medicine | Admitting: Internal Medicine

## 2023-11-18 VITALS — BP 117/71 | HR 78 | Temp 97.9°F | Resp 16

## 2023-11-18 DIAGNOSIS — M810 Age-related osteoporosis without current pathological fracture: Secondary | ICD-10-CM | POA: Insufficient documentation

## 2023-11-18 MED ORDER — DENOSUMAB 60 MG/ML ~~LOC~~ SOSY
PREFILLED_SYRINGE | SUBCUTANEOUS | Status: AC
  Filled 2023-11-18: qty 1

## 2023-11-18 MED ORDER — DENOSUMAB 60 MG/ML ~~LOC~~ SOSY
60.0000 mg | PREFILLED_SYRINGE | Freq: Once | SUBCUTANEOUS | Status: AC
  Administered 2023-11-18: 60 mg via SUBCUTANEOUS

## 2023-12-03 NOTE — Progress Notes (Signed)
 Remote PPM Transmission

## 2023-12-10 ENCOUNTER — Other Ambulatory Visit: Payer: Self-pay | Admitting: Internal Medicine

## 2023-12-12 ENCOUNTER — Ambulatory Visit (INDEPENDENT_AMBULATORY_CARE_PROVIDER_SITE_OTHER)

## 2023-12-12 DIAGNOSIS — I495 Sick sinus syndrome: Secondary | ICD-10-CM | POA: Diagnosis not present

## 2023-12-13 LAB — CUP PACEART REMOTE DEVICE CHECK
Date Time Interrogation Session: 20251016101019
Implantable Lead Connection Status: 753985
Implantable Lead Connection Status: 753985
Implantable Lead Implant Date: 20170420
Implantable Lead Implant Date: 20170420
Implantable Lead Location: 753859
Implantable Lead Location: 753860
Implantable Lead Model: 377
Implantable Lead Model: 377
Implantable Lead Serial Number: 49378160
Implantable Lead Serial Number: 49421706
Implantable Pulse Generator Implant Date: 20170420
Pulse Gen Model: 394969
Pulse Gen Serial Number: 68713503

## 2023-12-15 ENCOUNTER — Ambulatory Visit: Payer: Self-pay | Admitting: Internal Medicine

## 2023-12-19 NOTE — Progress Notes (Signed)
 Remote PPM Transmission

## 2023-12-20 ENCOUNTER — Other Ambulatory Visit: Payer: Self-pay | Admitting: Internal Medicine

## 2023-12-20 DIAGNOSIS — I480 Paroxysmal atrial fibrillation: Secondary | ICD-10-CM

## 2023-12-20 NOTE — Telephone Encounter (Signed)
 Eliquis  5mg  refill request received. Patient is 83 years old, weight-62.6kg, Crea-0.70 on 05/07/22-pt had labs drawn at PCP and need them faxed over-will await, Diagnosis-afib, and last seen by Dr. Waddell on 10/04/23. Dose is appropriate based on dosing criteria.    Called PCP office and the answering service is now answering their calls. Will have to call back on Monday to get update labs that were done on 09/27/23 faxed over.

## 2023-12-20 NOTE — Telephone Encounter (Signed)
 Refill Request.

## 2023-12-23 NOTE — Telephone Encounter (Signed)
Prescription refill request for Eliquis received. Indication: Last office visit: Scr: Age:  Weight:

## 2023-12-24 NOTE — Telephone Encounter (Addendum)
 Awaiting labs to be faxed over from PCP.once received will need to place in the chart.   Age-83 yrs old, wt-62.6kg, Crea-0.7 on 09/26/23 via Dr. Sheppard office, Afib, Waddell 10/04/23  Labs in media tab in chart.

## 2024-01-15 ENCOUNTER — Encounter (HOSPITAL_BASED_OUTPATIENT_CLINIC_OR_DEPARTMENT_OTHER): Payer: Self-pay

## 2024-03-12 ENCOUNTER — Ambulatory Visit

## 2024-03-12 ENCOUNTER — Telehealth (HOSPITAL_COMMUNITY): Payer: Self-pay | Admitting: Pharmacy Technician

## 2024-03-12 ENCOUNTER — Encounter (HOSPITAL_COMMUNITY): Payer: Self-pay | Admitting: Internal Medicine

## 2024-03-12 DIAGNOSIS — I480 Paroxysmal atrial fibrillation: Secondary | ICD-10-CM | POA: Diagnosis not present

## 2024-03-12 LAB — CUP PACEART REMOTE DEVICE CHECK
Battery Voltage: 45
Date Time Interrogation Session: 20260115092723
Implantable Lead Connection Status: 753985
Implantable Lead Connection Status: 753985
Implantable Lead Implant Date: 20170420
Implantable Lead Implant Date: 20170420
Implantable Lead Location: 753859
Implantable Lead Location: 753860
Implantable Lead Model: 377
Implantable Lead Model: 377
Implantable Lead Serial Number: 49378160
Implantable Lead Serial Number: 49421706
Implantable Pulse Generator Implant Date: 20170420
Pulse Gen Model: 394969
Pulse Gen Serial Number: 68713503

## 2024-03-12 NOTE — Telephone Encounter (Signed)
 Auth Submission: PENDING Site of care: [facility] Payer: [employer] MEDICARE Medication & CPT/J Code(s) submitted: Prolia  (Denosumab ) [id number] Diagnosis Code: M81.0  Route of submission (phone, fax, portal): CMM KEY: [id number] Phone # Fax # Auth type: Buy/Bill HB Units/visits requested: 60mg  x 2 doses, q 6 months Reference number: [id number] Approval from: *** to ***    Dagoberto Armour, CPhT [facility] Phone: [phone] 03/12/2024

## 2024-03-13 ENCOUNTER — Ambulatory Visit: Payer: Self-pay | Admitting: Cardiovascular Disease

## 2024-03-20 NOTE — Progress Notes (Signed)
 Remote PPM Transmission

## 2024-05-18 ENCOUNTER — Encounter (HOSPITAL_COMMUNITY)

## 2024-06-11 ENCOUNTER — Ambulatory Visit

## 2024-09-10 ENCOUNTER — Ambulatory Visit

## 2024-12-10 ENCOUNTER — Ambulatory Visit
